# Patient Record
Sex: Female | Born: 1965 | Race: Black or African American | Hispanic: No | Marital: Single | Smoking: Former smoker
Health system: Southern US, Community
[De-identification: ages and names within clinical notes are randomized; demographics above are authoritative.]

## PROBLEM LIST (undated history)

## (undated) DIAGNOSIS — I10 Essential (primary) hypertension: Secondary | ICD-10-CM

## (undated) DIAGNOSIS — I509 Heart failure, unspecified: Secondary | ICD-10-CM

## (undated) HISTORY — DX: Essential (primary) hypertension: I10

## (undated) HISTORY — PX: NO PAST SURGERIES: SHX2092

## (undated) HISTORY — DX: Heart failure, unspecified: I50.9

---

## 2006-04-11 ENCOUNTER — Emergency Department: Payer: Self-pay | Admitting: Emergency Medicine

## 2006-04-11 ENCOUNTER — Other Ambulatory Visit: Payer: Self-pay

## 2007-03-07 ENCOUNTER — Emergency Department: Payer: Self-pay | Admitting: Emergency Medicine

## 2013-07-09 LAB — BASIC METABOLIC PANEL
Anion Gap: 9 (ref 7–16)
BUN: 24 mg/dL — ABNORMAL HIGH (ref 7–18)
CHLORIDE: 102 mmol/L (ref 98–107)
CO2: 23 mmol/L (ref 21–32)
Calcium, Total: 8.7 mg/dL (ref 8.5–10.1)
Creatinine: 1.3 mg/dL (ref 0.60–1.30)
EGFR (African American): 56 — ABNORMAL LOW
EGFR (Non-African Amer.): 48 — ABNORMAL LOW
GLUCOSE: 110 mg/dL — AB (ref 65–99)
Osmolality: 273 (ref 275–301)
Potassium: 3.5 mmol/L (ref 3.5–5.1)
Sodium: 134 mmol/L — ABNORMAL LOW (ref 136–145)

## 2013-07-09 LAB — CBC
HCT: 42.9 % (ref 35.0–47.0)
HGB: 14.1 g/dL (ref 12.0–16.0)
MCH: 33 pg (ref 26.0–34.0)
MCHC: 33 g/dL (ref 32.0–36.0)
MCV: 100 fL (ref 80–100)
Platelet: 237 10*3/uL (ref 150–440)
RBC: 4.29 10*6/uL (ref 3.80–5.20)
RDW: 13.6 % (ref 11.5–14.5)
WBC: 19.2 10*3/uL — ABNORMAL HIGH (ref 3.6–11.0)

## 2013-07-10 ENCOUNTER — Inpatient Hospital Stay: Payer: Self-pay | Admitting: Internal Medicine

## 2013-07-11 LAB — CBC WITH DIFFERENTIAL/PLATELET
Basophil #: 0 10*3/uL (ref 0.0–0.1)
Basophil %: 0.3 %
Eosinophil #: 0.1 10*3/uL (ref 0.0–0.7)
Eosinophil %: 0.4 %
HCT: 44.6 % (ref 35.0–47.0)
HGB: 15 g/dL (ref 12.0–16.0)
LYMPHS ABS: 1.1 10*3/uL (ref 1.0–3.6)
Lymphocyte %: 9.4 %
MCH: 33.3 pg (ref 26.0–34.0)
MCHC: 33.5 g/dL (ref 32.0–36.0)
MCV: 99 fL (ref 80–100)
MONO ABS: 1 x10 3/mm — AB (ref 0.2–0.9)
Monocyte %: 8.4 %
NEUTROS ABS: 9.3 10*3/uL — AB (ref 1.4–6.5)
Neutrophil %: 81.5 %
Platelet: 197 10*3/uL (ref 150–440)
RBC: 4.49 10*6/uL (ref 3.80–5.20)
RDW: 13.6 % (ref 11.5–14.5)
WBC: 11.4 10*3/uL — ABNORMAL HIGH (ref 3.6–11.0)

## 2013-07-11 LAB — LIPID PANEL
CHOLESTEROL: 157 mg/dL (ref 0–200)
HDL Cholesterol: 30 mg/dL — ABNORMAL LOW (ref 40–60)
Ldl Cholesterol, Calc: 101 mg/dL — ABNORMAL HIGH (ref 0–100)
Triglycerides: 131 mg/dL (ref 0–200)
VLDL CHOLESTEROL, CALC: 26 mg/dL (ref 5–40)

## 2013-07-11 LAB — COMPREHENSIVE METABOLIC PANEL
ALK PHOS: 89 U/L
AST: 24 U/L (ref 15–37)
Albumin: 2.9 g/dL — ABNORMAL LOW (ref 3.4–5.0)
Anion Gap: 8 (ref 7–16)
BILIRUBIN TOTAL: 0.4 mg/dL (ref 0.2–1.0)
BUN: 20 mg/dL — ABNORMAL HIGH (ref 7–18)
CALCIUM: 8.6 mg/dL (ref 8.5–10.1)
CHLORIDE: 100 mmol/L (ref 98–107)
Co2: 26 mmol/L (ref 21–32)
Creatinine: 1.31 mg/dL — ABNORMAL HIGH (ref 0.60–1.30)
GFR CALC AF AMER: 55 — AB
GFR CALC NON AF AMER: 48 — AB
Glucose: 108 mg/dL — ABNORMAL HIGH (ref 65–99)
Osmolality: 271 (ref 275–301)
Potassium: 3.3 mmol/L — ABNORMAL LOW (ref 3.5–5.1)
SGPT (ALT): 21 U/L (ref 12–78)
Sodium: 134 mmol/L — ABNORMAL LOW (ref 136–145)
Total Protein: 8.6 g/dL — ABNORMAL HIGH (ref 6.4–8.2)

## 2013-07-11 LAB — MAGNESIUM: MAGNESIUM: 1.8 mg/dL

## 2013-07-11 LAB — HEMOGLOBIN A1C: HEMOGLOBIN A1C: 6 % (ref 4.2–6.3)

## 2013-07-11 LAB — TSH: THYROID STIMULATING HORM: 1.62 u[IU]/mL

## 2013-07-12 LAB — CREATININE, SERUM
Creatinine: 1.05 mg/dL (ref 0.60–1.30)
EGFR (African American): >60
EGFR (Non-African Amer.): >60

## 2013-07-12 LAB — WBC: WBC: 10 10*3/uL (ref 3.6–11.0)

## 2013-07-12 LAB — SODIUM: Sodium: 137 mmol/L (ref 136–145)

## 2013-07-15 LAB — CULTURE, BLOOD (SINGLE)

## 2013-08-11 ENCOUNTER — Ambulatory Visit: Payer: Self-pay

## 2014-08-01 NOTE — Consult Note (Signed)
PATIENT NAME:  Sharon Delgado, Sharon Delgado MR#:  [id number] DATE OF BIRTH:  [birth year]  DATE OF CONSULTATION:  07/10/2013  CONSULTING PHYSICIAN:  Zackery Barefoot, MD  HISTORY OF PRESENT ILLNESS: The patient is a 49 year old African American female who presented to the Emergency Room last night after insect bite to the right upper ear. She developed neck pain and swelling, culminating in presentation to the Emergency Room. She was admitted for observation in the ICU because of the neck swelling and was treated with IV antibiotics. She feels significantly better today. I saw her earlier, and she in the past 5 hours has developed further improvement. She is now eating solid food and experiencing no problems with her airway. It appears that Dr. Amado Coe discussed the patient with Dr. Willeen Cass last night. A CT scan was completed, which showed no drainable abscess.   PAST MEDICAL HISTORY: Noncompliance, diabetes mellitus, hypertension, allergic rhinitis.   PAST SURGICAL HISTORY: Negative.   ALLERGIES: None.   HOME MEDICATIONS: None.  CURRENT MEDICATIONS: Include Unasyn, vancomycin, pantoprazole, p.r.n. morphine, metoprolol, labetalol, NovoLog insulin, hydralazine.   PHYSICAL EXAMINATION:  GENERAL: The patient is a pleasant, cooperative [place] American female in no acute distress.  NECK: There is visibly significant swelling in the right side of the neck. This is tender to deep palpation, measures approximately 10 x 8 cm, but there is full mobility of the neck. Trachea is in the midline.  ORAL CAVITY AND OROPHARYNX: Teeth are in poor repair with multiple caries. No obvious dental abscess. Floor of mouth is soft. Tongue is soft.  EARS: External auditory canals are clear. There is an area of excoriation in the anterosuperior helix, and the superior two thirds of the helix are significantly erythematous, but this is stable compared to 5 hours ago.  NOSE: Nares are patent. The septum is midline.   IMPRESSION AND PLAN:  Right ear cellulitis with lymphadenitis and neck cellulitis. The patient is responding very well to IV antibiotics, and her airway is stable. She is stable for transfer to the floor and will continue IV antibiotics. She can be converted to oral antibiotics after approximately 24 hours and be discharged on oral antibiotics. I will plan to see her on a p.r.n. basis.    ____________________________ Shela Commons. Gertie Baron, MD jmc:jcm D: 07/10/2013 17:22:57 ET T: 07/10/2013 18:08:48 ET JOB#: [id number]  cc: Zackery Barefoot, MD, <Dictator> [facility] - Practice Administrator Wendee Copp MD ELECTRONICALLY SIGNED 07/28/2013 21:07

## 2014-08-01 NOTE — H&P (Signed)
PATIENT NAME:  Sharon Delgado, Sharon Delgado MR#:  [id number] DATE OF BIRTH:  1966-04-07  DATE OF ADMISSION:  07/10/2013  PRIMARY CARE PHYSICIAN:  None.  REFERRING PHYSICIAN:  Dr. Merlinda Frederick.  CHIEF COMPLAINT:  Right-sided neck pain and swelling.   HISTORY OF PRESENT ILLNESS:  The patient is a 49 year old African American female with a past medical history of diabetes mellitus, not seen by any physician for the past several years, is presenting to the ER with a chief complaint of right-sided neck pain and swelling.  The patient is reporting that approximately seven days ago she noticed a small pimple-like swelling on the right earlobe.  The patient manipulated the pimple with her nails and subsequently it got ruptured.  Following that, she has noticed right-sided neck swelling for the past two days, which has been worse with severe swelling and pain.  The patient denies any fevers.  No similar complaints in the past.  She was nauseated and vomited x 2 last night.  Denies any nausea or vomiting today.  Denies any difficulty in swallowing today.  Denies any shortness of breath or chest pain.  Denies any history of MRSA.  She is diabetic and not on any medications.  Not seen by any doctors for the past several years.  Her mom is also diabetic and checks the patient's blood sugar once in a while.  She states that her blood sugar usually runs okay and she could not recall any numbers.  CAT scan of the neck with contrast was done which has revealed extensive soft tissue edema along the right side of the neck extending into the prevertebral space and also compressing right internal jugular vein due to surrounding edema.  The ER physician has discussed this with on-call ENT doctor Dr. Willeen Cass who has recommended IV antibiotics at this point.  The patient was given IV vancomycin and Unasyn and hospitalist team is called for admission.  During my examination the patient is still in pain, but she was able to move her neck from side  to side.  Denies any shortness of breath, difficulty in swallowing.  She does not feel like her throat is closing up.  Her vital signs are stable except the blood pressure which is extremely high.  The patient was given IV Dilaudid for pain control.  The patient's sister is at bedside during my examination.   PAST MEDICAL HISTORY:  Noncompliance, diabetes mellitus, hypertension, allergic rhinitis.   PAST SURGICAL HISTORY:  None.   ALLERGIES:  No known drug allergies.   PSYCHOSOCIAL HISTORY:  Lives at home with her children, smokes cigars.  Denies alcohol.  She used to smoke marijuana, but not anymore.   FAMILY HISTORY:  Mother has diabetes mellitus.   HOME MEDICATIONS:  None.  REVIEW OF SYSTEMS:  CONSTITUTIONAL:  Denies any fever, fatigue or weakness.  EYES:  Denies blurry vision, double vision.  EARS, NOSE, THROAT:  Denies tinnitus.  Complaining of a bump-like swelling on her earlobe.  Denies any hearing loss, snoring, postnasal drip.  She has chronic allergic rhinitis.  Right lateral aspect of the neck has massive edema and tender, warm to touch.  Positive cervical lymphadenopathy.  RESPIRATORY:  Denies cough, shortness of breath, painful respiration or COPD.  CARDIOVASCULAR:  No chest pain, palpitations, syncope.  GASTROINTESTINAL:  She had nausea and two episodes of vomiting yesterday, but denies any today.  Denies abdominal pain, hematemesis, melena.  GENITOURINARY:  No dysuria, hematuria.  GYNECOLOGIC AND BREAST:  Denies breast mass or vaginal  discharge.  ENDOCRINE:  Denies polyuria, nocturia, thyroid problems.  Has chronic history of diabetes mellitus, not on any medications.  HEMATOLOGIC AND LYMPHATIC:  No anemia, easy bruising, bleeding.  INTEGUMENTARY:  No acne, rash.  She has a lesion on the right earlobe.  She has right-sided cervical lymphadenopathy.  NEUROLOGIC:  Denies any numbness, weakness, dysarthria.  Denies ataxia, dementia.  PSYCHIATRIC:  No ADD, OCD.  No anxiety.   MUSCULOSKELETAL:  Complaining of neck pain, but denies any back pain.  Denies any shoulder pain.  Denies gout.   PHYSICAL EXAMINATION: VITAL SIGNS:  Temperature 99.8, pulse 85, respirations 15 to 18, blood pressure 183/98, pulse ox 98%.  GENERAL APPEARANCE:  Not under acute distress.  Moderately built and obese.  HEENT:  Normocephalic, atraumatic.  Pupils are equally reactive to light and accommodation.  No scleral icterus.  No conjunctival injection.  Nares are patent.  Examination of the oral cavity, moist mucous membranes.  Right molar tooth seemed to be infected.  Uvula is midline.  Right lateral aspect of the neck with massive edema, tenderness, warm to touch, positive right cervical lymphadenopathy.  The right earlobe 1 x 1 cm small pustule is noticed with no discharge.  It is tender to touch.  LUNGS:  Clear to auscultation bilaterally.  No axillary muscle usage.  Minimal end expiratory wheezing is noted.  CARDIAC:  S1, S2 normal.  Regular rate and rhythm.  No murmur.  GASTROINTESTINAL:  Soft, obese.  Bowel sounds are positive in all four quadrants.  Nontender, nondistended.  No hepatosplenomegaly.  No masses felt.  NEUROLOGIC:  Awake, alert, oriented x 3.  Upper motor and sensory are grossly intact in both upper and lower extremities.  Cranial nerves II through XII are intact.  Reflexes are 2+.  EXTREMITIES:  No edema.  No cyanosis.  No clubbing.  SKIN:  Warm to touch.  Normal turgor.  No rashes.  No lesions, except those mentioned under ENT.  PSYCHIATRIC:  Normal mood and affect.   LABORATORY AND IMAGING STUDIES:   1.  CAT scan of the neck with contrast has revealed no drainable fluid collection is identified, extensive soft tissue edema along the right side of the neck with diffuse soft tissue density expanding the right parapharyngeal fat planes and extending into the prevertebral space and left prominent soft tissue edema extending to the left pharyngeal fat plane associated with wall  thickening along the right common and internal carotid artery, likely reflects the active changes.  Compression of the right internal jugular vein due to surrounding edema.   2.  Enlargement somewhat edematous right cervical nodes measuring up to 1.3 cm in the short access.   3.  A 2.8 x 1.9 cm  lesion at the right side of the anterior hard palate, could reflect a very large periapical abscess given the extent of underlying dental disease.  Minimum partial opacification of the right mastoid air cells and partial opacification of the right side of the sphenoid sinus.    Accu-Chek 173.  Glucose 110, BUN 24, creatinine 1.30.  Serum sodium 134, potassium 3.5, chloride 102, CO2 23, anion gap 9, GFR 48 and serum osmolality 270.  Calcium 8.7.  CBC, white count is elevated at 19.2.  The rest of the labs are normal.    ASSESSMENT AND PLAN:   1.  A 49 year old [place] American female who was not seen by any doctor in the past several years comes with right neck pain and swelling with soft tissue density  extending into the prevertebral space and compression of the right internal jugular vein due to surrounding edema.  We will admit the patient to Critical Care Unit stepdown.  We will provide her intravenous antibiotics, clindamycin and vancomycin.  ENT consult is placed.  We will provide her oxygen via nasal cannula as needed.  The patient was explained the possibility of airway compromise and she agrees with the plan of intubation if needed.  The CAT scan results were discussed and the complications were explained to the patient.  She is aware.  Follow up with a blood cultures and check a.m. labs.  We will provide her pain management with intravenous Toradol and morphine as needed basis.  2.  Malignant hypertension.  The patient is not on any blood pressure medications.  The patient is started on metoprolol and amlodipine.  We will provide her intravenous beta blockers as needed basis for uncontrolled blood  pressure.  3.  History of diabetes mellitus.  We will check hemoglobin A1c.  The patient will be on sliding scale insulin.  Diabetic diet education was provided.   4.  Noncompliance.  I have reinforced the importance of following up with the primary care physician as she has diabetes and hypertension.  5.  We will provide gastrointestinal and deep vein thrombosis prophylaxis.   Plan of care discussed in detail with the patient and her sister at bedside.  They both verbalized understanding of the plan.   CODE STATUS:  SHE IS FULL CODE.  Sister is the medical power of attorney.   Total time spent on the admission is 50 minutes.    ____________________________ Ramonita Lab, MD ag:ea D: 07/10/2013 01:03:00 ET T: 07/10/2013 02:23:12 ET JOB#: [id number]  cc: Ramonita Lab, MD, <Dictator> Ramonita Lab MD ELECTRONICALLY SIGNED 07/17/2013 4:43

## 2014-08-01 NOTE — Discharge Summary (Signed)
PATIENT NAME:  Sharon Delgado, Sharon Delgado MR#:  [id number] DATE OF BIRTH:  07/16/1965  DATE OF ADMISSION:  07/10/2013 DATE OF DISCHARGE:  07/12/2013  ADMISSION DIAGNOSIS: Ear and neck cellulitis.   DISCHARGE DIAGNOSES: 1.  Ear and neck cellulitis. 2.  Accelerated hypertension.  3.  History of diabetes.   CONSULTATIONS: ENT.  LABORATORIES AT DISCHARGE: A1c is 6.0. Sodium is 137, creatinine 1.05.   HOSPITAL COURSE: A 49 year old female who presented to the ER after a probable spider bite with a mass on her neck. She had a CT scan which showed evidence of cellulitis. For further details, please refer to the H and P.  1.  Ear and neck cellulitis.  ENT was consulted. No abscess was noted on the CT scan. Her swelling has actually improved with IV antibiotics. She will be changed to clindamycin and Augmentin at discharge. She also has really poor dental hygiene which I spoke to her about which is in need attention.  2.  Sepsis. Her neck cellulitis and lymphadenitis improved with IV antibiotics. Blood cultures negative to date.  3.  Malignant hypertension: The patient's blood pressure is not very well controlled. I put her on some medications but she will need close follow-up for her blood pressure.  4.  Tobacco abuse. The patient was encouraged to stop smoking.  5.  History of diabetes. Her A1c 6.0. She will need follow-up with her PCP.  6.  Hyperlipidemia. LDL was 101, with a low HDL. Recommend to quit smoking and exercise to increase HDL.  7.  She has been reinforced the importance of following up with her primary care physician, as she has diabetes and hypertension.   DISCHARGE MEDICATIONS: 1.  Clindamycin 300 mg q. 8 hours x 11 days.  2.  Augmentin 875/125 q. 12 hours x 11 days.  3.  Accupril 40 mg daily.  4.  HCTZ 50 mg daily.  5.  Aspirin 81 mg daily.   DISCHARGE DIET: Low sodium, ADA diet.   DISCHARGE ACTIVITY: As tolerated.   DISCHARGE FOLLOWUP: The patient was given instructions on how to  find a primary care physician in this area and will need follow-up in one week .  She is on HCTZ and lisinopril. This was discussed in detail with the patient.  The patient is medically stable for discharge.    TIME SPENT:  35 minutes.   ____________________________ Janyth Contes. Juliene Pina, MD spm:dp D: 07/12/2013 13:50:08 ET T: 07/12/2013 16:30:36 ET JOB#: [id number]  cc: Veyda Kaufman P. Juliene Pina, MD, <Dictator> Janyth Contes Jajuan Skoog MD ELECTRONICALLY SIGNED 07/12/2013 21:35

## 2014-08-17 IMAGING — CT CT NECK WITH CONTRAST
4 of 5 series · 16 of 33 positions shown, 18 images · IV contrast (isovue)
Comparison: None.

CLINICAL DATA: Bit by a spider; diffuse right-sided neck and facial
swelling, with associated cellulitis.

EXAM:
CT NECK WITH CONTRAST
TECHNIQUE: Multidetector CT imaging of the neck was performed using the
standard protocol following the bolus administration of intravenous
contrast.
CONTRAST:  75 mL of Isovue 370 IV contrast

[Series 3: axial neck · axial · 0.49mm/px · z∈[-278,-140]mm · 4 of 116 slices shown, 5 images]
[im 24/116  soft-tissue]
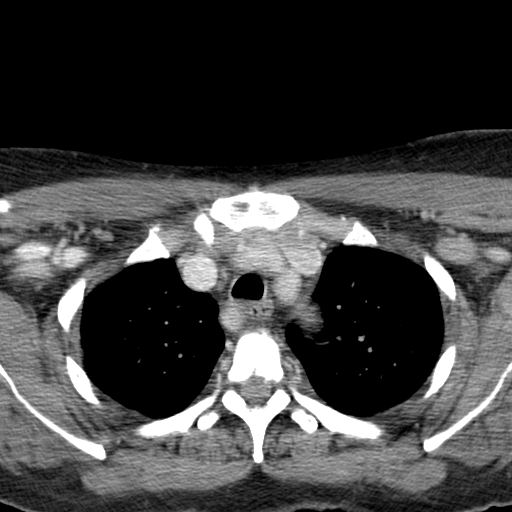
[im 24/116  bone]
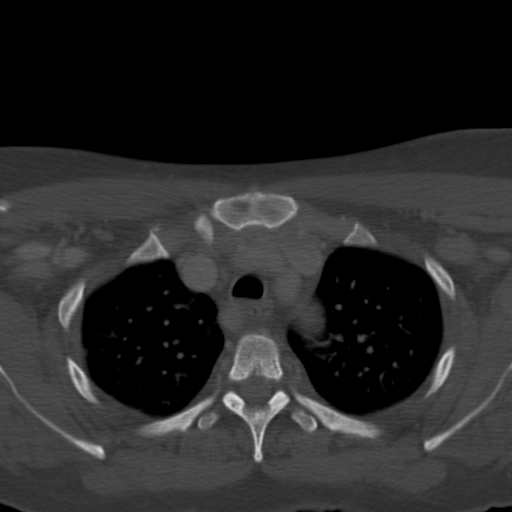
[im 47/116  bone]
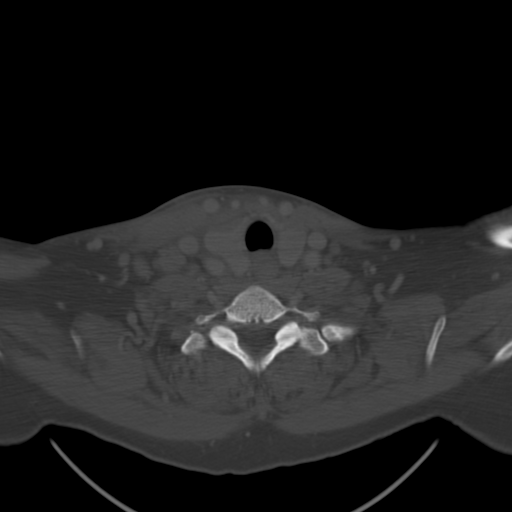
[im 70/116  bone]
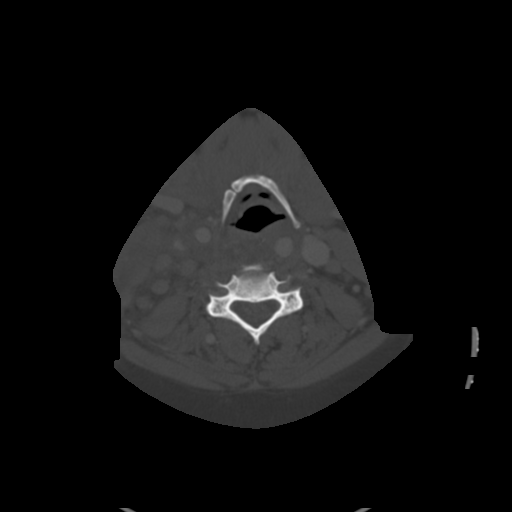
[im 93/116  bone]
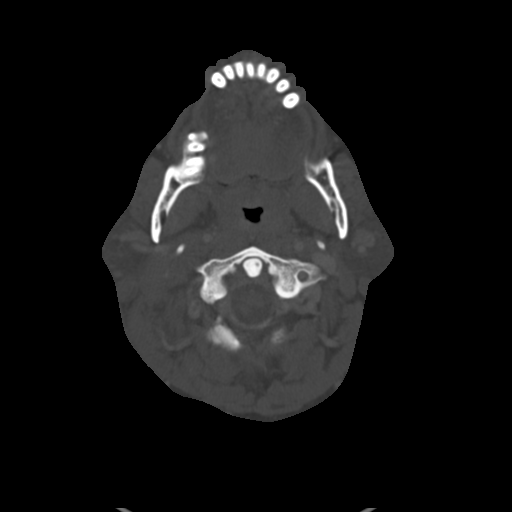

[Series 6: sag neck · sagittal · 0.56mm/px · 5 of 125 slices shown, 6 images]
[im 42/125  bone]
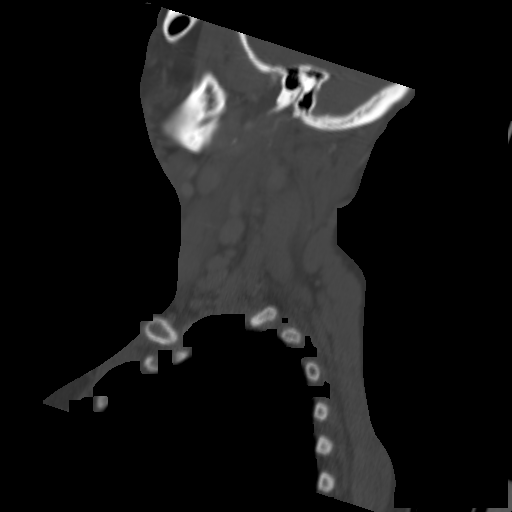
[im 52/125  bone]
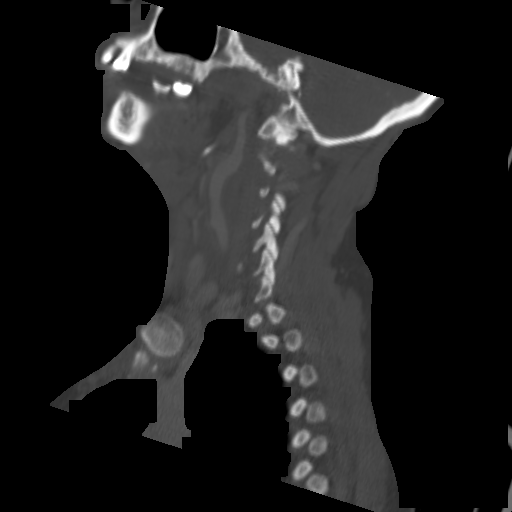
[im 63/125  soft-tissue]
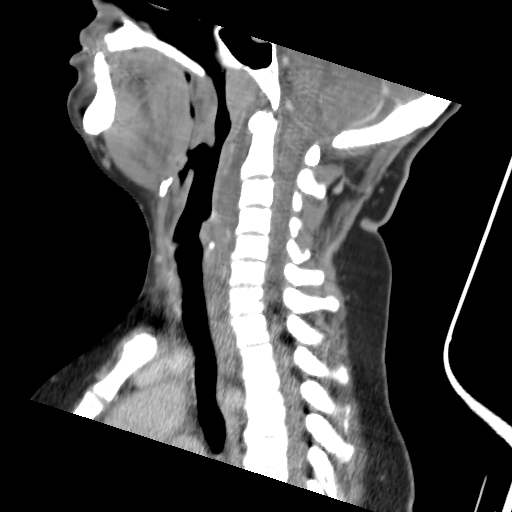
[im 63/125  bone]
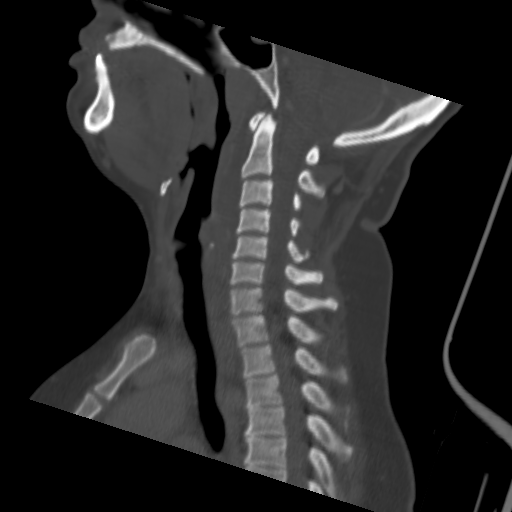
[im 73/125  bone]
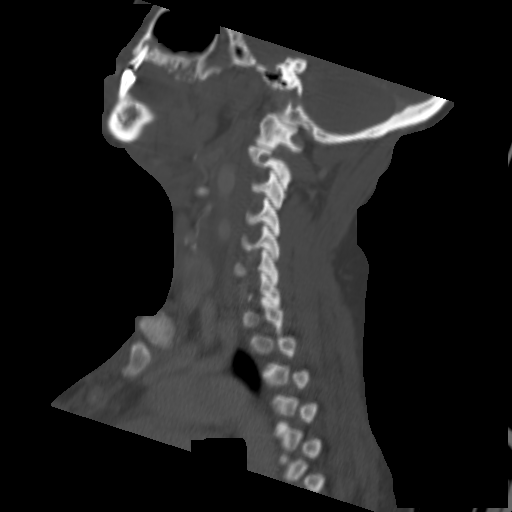
[im 83/125  bone]
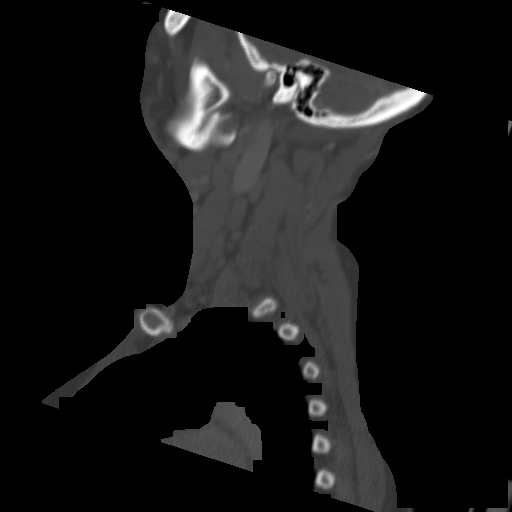

[Series 7: cor neck · coronal · 0.68mm/px · 3 of 99 slices shown]
[im 29/99  bone]
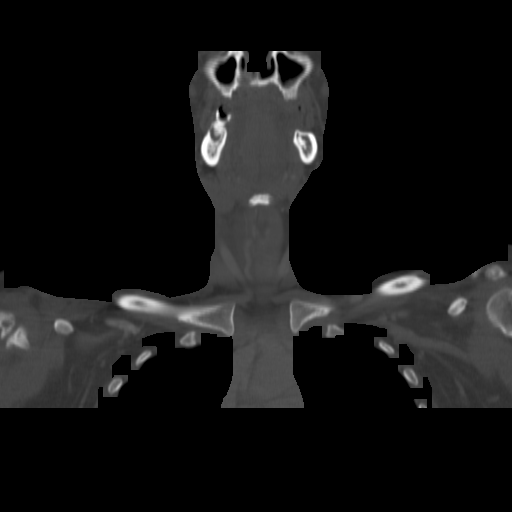
[im 43/99  bone]
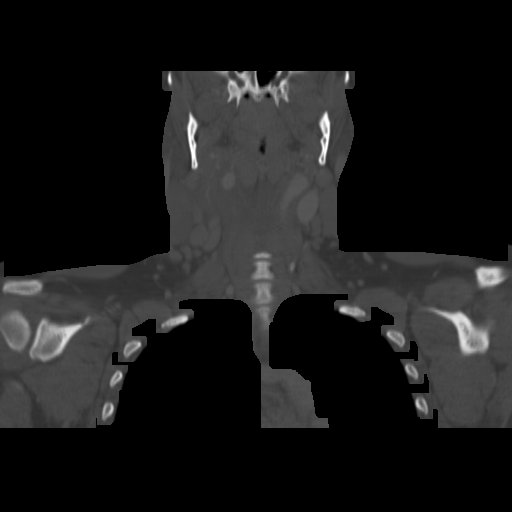
[im 56/99  bone]
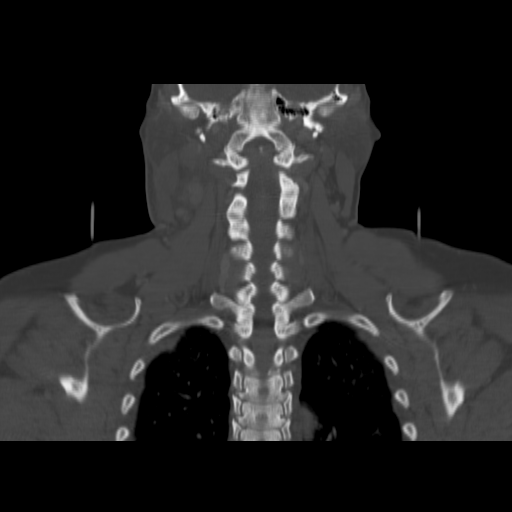

[Series 8: ax oropharynx · axial · 0.68mm/px · z∈[-356,-225]mm · 4 of 127 slices shown]
[im 26/127  bone]
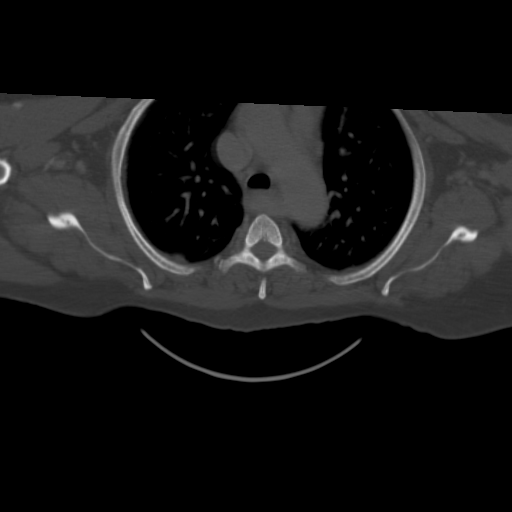
[im 51/127  bone]
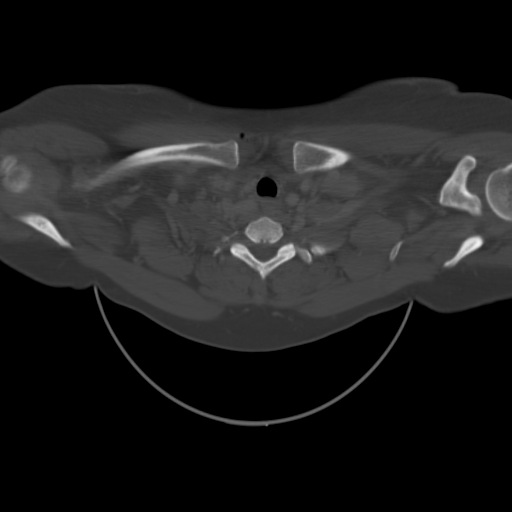
[im 76/127  bone]
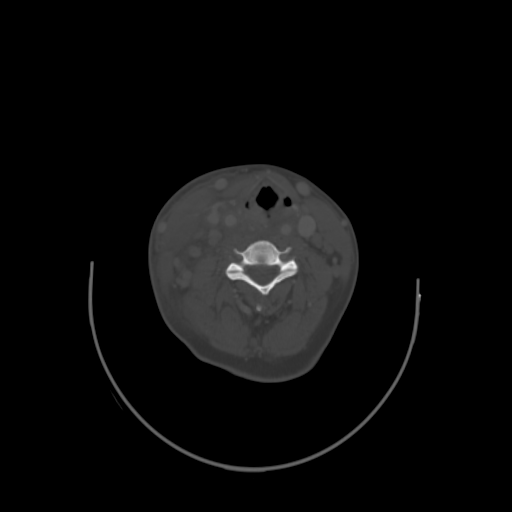
[im 101/127  bone]
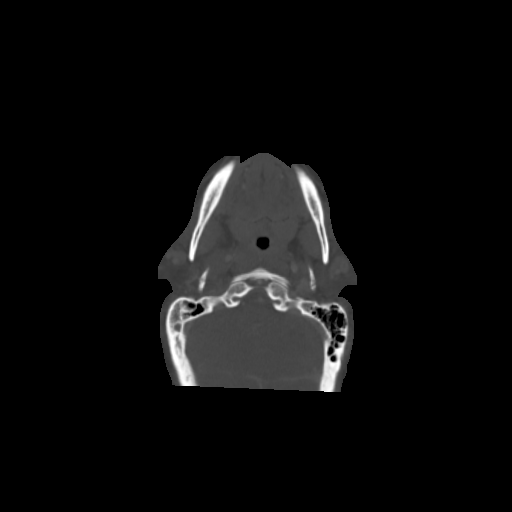

[16 of 33 positions shown; findings below may reference images not displayed]

FINDINGS: There is extensive soft tissue edema extending along the right side
of the neck, with diffuse soft tissue density filling the right
parapharyngeal fat planes, extending into the prevertebral space,
with less prominent soft tissue edema along the left parapharyngeal
fat planes. Associated wall thickening is noted along the course of
the right common and internal carotid arteries, likely reflecting
reactive change. There is compression of the right internal jugular
vein due to surrounding edema. Surrounding enlarged and somewhat
edematous right cervical nodes are seen, measuring up to 1.3 cm in
short axis.

No definite drainable fluid collection is identified, though the
prevertebral involvement is somewhat concerning. There is no
definite evidence of involvement of the floor of the mouth at this
time.

The parotid and submandibular glands remain relatively symmetric and
grossly unremarkable. The remainder of the visualized vasculature is
grossly symmetric. Note is incidentally made of a retroesophageal
right subclavian artery. The visualized portions of the lung apices
are grossly clear, aside from a bleb at the right lung apex.

A large 2.8 x 1.9 cm erosive lesion is noted at the right side of
the anterior hard palate. This could reflect a very large periapical
abscess, given the extent of underlying dental disease. There is
absence of much of the maxillary dentition, with multiple dental
caries seen.

There is minimal partial opacification of the right mastoid air
cells, and partial opacification of the right side of the sphenoid
sinus. The remaining visualized paranasal sinuses and left mastoid
air cells are well aerated. The visualized portions of the brain are
grossly unremarkable in appearance.
IMPRESSION: 1. No definite drainable fluid collection identified.
2. Extensive soft tissue edema along the right side of the neck,
with diffuse soft tissue density expanding the right parapharyngeal
fat planes and extending into the prevertebral space, and less
prominent soft tissue edema extending to the left parapharyngeal fat
planes. Associated wall thickening along the right common and
internal carotid arteries likely reflects reactive change.
Compression of the right internal jugular vein due to surrounding
edema.
3. Enlargement somewhat edematous right cervical nodes seen,
measuring up to 1.3 cm in short axis.
4. 2.8 x 1.9 cm erosive lesion at the right side of the anterior
hard palate could reflect a very large periapical abscess, given the
extent of underlying dental disease.
5. Minimal partial opacification of the right mastoid air cells, and
partial opacification of the right side of the sphenoid sinus.

## 2016-07-22 ENCOUNTER — Ambulatory Visit
Admission: EM | Admit: 2016-07-22 | Discharge: 2016-07-22 | Disposition: A | Discharge status: Home / Self Care | Payer: Self-pay | Attending: Family Medicine | Admitting: Family Medicine

## 2016-07-22 DIAGNOSIS — K122 Cellulitis and abscess of mouth: Secondary | ICD-10-CM

## 2016-07-22 DIAGNOSIS — K0510 Chronic gingivitis, plaque induced: Secondary | ICD-10-CM

## 2016-07-22 DIAGNOSIS — K047 Periapical abscess without sinus: Secondary | ICD-10-CM

## 2016-07-22 MED ORDER — HYDROCODONE-ACETAMINOPHEN 5-325 MG PO TABS: 1.0000 | ORAL_TABLET | ORAL | 0 refills | Status: DC | PRN

## 2016-07-22 MED ORDER — CEFTRIAXONE SODIUM 1 G IJ SOLR
1.0000 g | Freq: Once | INTRAMUSCULAR | Status: AC
  Administered 2016-07-22: 1 g via INTRAMUSCULAR

## 2016-07-22 MED ORDER — AMOXICILLIN-POT CLAVULANATE 875-125 MG PO TABS: 1.0000 | ORAL_TABLET | Freq: Two times a day (BID) | ORAL | 0 refills | Status: DC

## 2016-07-22 NOTE — ED Provider Notes (Signed)
CSN: [id number]     Arrival date & time 07/22/16  1542 History   First MD Initiated Contact with Patient 07/22/16 1612     Chief Complaint  Patient presents with  . Facial Swelling   (Consider location/radiation/quality/duration/timing/severity/associated sxs/prior Treatment) 51 year old female with past medical history as below who presents with right facial swelling and pain that began yesterday and is worsening. Patient also reports ongoing seasonal allergy symptoms that have included nasal and sinus congestion. Patient denies any fevers but did report some chills this past Wednesday. Patient denies any pain with biting and denies any recent that the work. She does report that she does have some teeth that have been giving her problems and probably need to be removed. Patient denies headache but does report that her nasal passages have been swollen and states that the right side of her face is tender to touch.  Patient reports history of smoking but stopped over one year ago      History reviewed. No pertinent past medical history. Past Surgical History:  Procedure Laterality Date  . NO PAST SURGERIES     History reviewed. No pertinent family history. Social History  Substance Use Topics  . Smoking status: Former Games developer  . Smokeless tobacco: Never Used  . Alcohol use Yes     Comment: beer   OB History    No data available     Review of Systems  HENT: Positive for congestion, dental problem, facial swelling, sinus pain and sinus pressure.        Seasonal allergies  All other systems reviewed and are negative.   Allergies  Patient has no known allergies.  Home Medications   Prior to Admission medications   Medication Sig Start Date End Date Taking? Authorizing Provider  amoxicillin-clavulanate (AUGMENTIN) 875-125 MG tablet Take 1 tablet by mouth every 12 (twelve) hours. First dose tonight 07/22/16   Candis Schatz, PA-C  HYDROcodone-acetaminophen (NORCO/VICODIN) 5-325  MG tablet Take 1 tablet by mouth every 4 (four) hours as needed. 07/22/16   Candis Schatz, PA-C   Meds Ordered and Administered this Visit   Medications  cefTRIAXone (ROCEPHIN) injection 1 g (1 g Intramuscular Given 07/22/16 1636)    BP (!) 198/93 (BP Location: Left Arm)   Pulse 78   Temp 98.7 F (37.1 C) (Oral)   Resp 16   Ht 5\' 1"  (1.549 m)   Wt 160 lb (72.6 kg)   SpO2 100%   BMI 30.23 kg/m  No data found.   Physical Exam  Constitutional: She is oriented to person, place, and time. She appears well-developed and well-nourished.  HENT:  Head: Normocephalic and atraumatic.  Mouth/Throat: Uvula is midline. Abnormal dentition. Dental caries present.  Poor dentition with multiple teeth that need to be removed due to gum disease. Abscess palpable to cheek alongside the back of the upper gums on the right. Area tender and warm to the touch.  Eyes: EOM are normal. Pupils are equal, round, and reactive to light.  Neck: Normal range of motion.  Cardiovascular: Normal rate and regular rhythm.   Pulmonary/Chest: Effort normal.  Abdominal: Soft. Bowel sounds are normal.  Musculoskeletal: Normal range of motion.  Neurological: She is alert and oriented to person, place, and time.  Skin: Skin is warm and dry.    Urgent Care Course     Procedures none  Labs Review Labs Reviewed - No data to display  Imaging Review No results found.    MDM   1.  Dental abscess   2. Gingivitis   3. Oral abscess    Patient presents with oral abscess with pain and swelling that began yesterday. Symptoms likely masked by her ongoing seasonal allergy issues. Patient with multiple areas of poor dentition. Patient given injection of 1 g of Rocephin and a course of antibiotics with Augmentin every 12 hours with instruction to take her first dose this evening. Patient advised that if her pain is worse at all tomorrow or if not better by the end of tomorrow afternoon to present to ER. Recommended  either [facility] or [facility] as they would have ENT specialist available to see her at that point. Patient advised that she not have any issues until Monday, she can follow-up with her primary care provider. Patient verbalized understanding is in agreement with plan. Patient was seen in tandem with Dr. Thurmond Butts.  Candis Schatz, PA-C     Candis Schatz, PA-C 07/22/16 [phone]

## 2016-07-22 NOTE — Discharge Instructions (Signed)
-  you received an injection of 1 gram of Rocephin in the clinic -Augmentin, one tablet every 12 hours with the first dose tonight -you may take OTC Tylenol and ibuprofen as needed for pain -Norco 1 tablet every 4 hours as needed for pain that does not improve with OTC medications -If symptoms are worse tomorrow, please go to the emergency for further treatment. Recommend [facility] or [facility] a they will have Ear/Nose/Throat specialists available -If symptoms are not better by the end of day tomorrow, recommend going into the ER at that time as well.

## 2016-07-22 NOTE — ED Triage Notes (Signed)
Patient complains of facial swelling that started yesterday on her right side. Patient states that she was having teeth pain prior to swelling, pain has since went away.

## 2018-07-15 ENCOUNTER — Encounter: Payer: Self-pay | Admitting: Family Medicine

## 2018-07-15 ENCOUNTER — Ambulatory Visit: Payer: Medicaid Other | Admitting: Family Medicine

## 2018-07-15 NOTE — Progress Notes (Signed)
Reached out to Sharon Delgado - was able to make contact via telephone, then call dropped.  Attempted to call back several times and was unable to do so.  She is welcome to reschedule at any time.

## 2018-10-30 ENCOUNTER — Other Ambulatory Visit: Payer: Self-pay

## 2018-10-30 DIAGNOSIS — Z20822 Contact with and (suspected) exposure to covid-19: Secondary | ICD-10-CM

## 2018-11-02 LAB — NOVEL CORONAVIRUS, NAA: SARS-CoV-2, NAA: NOT DETECTED

## 2019-07-04 ENCOUNTER — Ambulatory Visit: Payer: Medicaid Other | Attending: Internal Medicine

## 2019-07-04 DIAGNOSIS — Z23 Encounter for immunization: Secondary | ICD-10-CM

## 2019-07-04 NOTE — Progress Notes (Signed)
   Covid-19 Vaccination Clinic  Name:  Sharon Delgado    MRN: [id number] DOB: December 02, 1965  07/04/2019  [name] was observed post Covid-19 immunization for 15 minutes without incident. She was provided with Vaccine Information Sheet and instruction to access the V-Safe system.   [name] was instructed to call 911 with any severe reactions post vaccine: Marland Kitchen Difficulty breathing  . Swelling of face and throat  . A fast heartbeat  . A bad rash all over body  . Dizziness and weakness   Immunizations Administered    Name Date Dose VIS Date Route   Pfizer COVID-19 Vaccine 07/04/2019  4:54 PM 0.3 mL 03/21/2019 Intramuscular   Manufacturer: [employer], [employer]   Lot: [id number]   NDC: [address]-1

## 2019-07-25 ENCOUNTER — Ambulatory Visit: Payer: Medicaid Other | Attending: Internal Medicine

## 2019-07-25 DIAGNOSIS — Z23 Encounter for immunization: Secondary | ICD-10-CM

## 2019-07-25 NOTE — Progress Notes (Signed)
   Covid-19 Vaccination Clinic  Name:  [name]    MRN: [id number] DOB: Aug 27, 1965  07/25/2019  [name] was observed post Covid-19 immunization for 15 minutes without incident. She was provided with Vaccine Information Sheet and instruction to access the V-Safe system.   [name] was instructed to call 911 with any severe reactions post vaccine: Marland Kitchen Difficulty breathing  . Swelling of face and throat  . A fast heartbeat  . A bad rash all over body  . Dizziness and weakness   Immunizations Administered    Name Date Dose VIS Date Route   Pfizer COVID-19 Vaccine 07/25/2019  4:34 PM 0.3 mL 03/21/2019 Intramuscular   Manufacturer: [employer], [employer]   Lot: [id number]   NDC: [address]-6

## 2020-12-07 ENCOUNTER — Inpatient Hospital Stay (HOSPITAL_COMMUNITY)
Admit: 2020-12-07 | Discharge: 2020-12-07 | Disposition: A | Discharge status: Home / Self Care | Payer: Self-pay | Attending: Internal Medicine | Admitting: Internal Medicine

## 2020-12-07 ENCOUNTER — Inpatient Hospital Stay
Admission: EM | Admit: 2020-12-07 | Discharge: 2020-12-12 | DRG: 286 | Disposition: A | Discharge status: Home / Self Care | Payer: Self-pay | Attending: Internal Medicine | Admitting: Internal Medicine

## 2020-12-07 ENCOUNTER — Emergency Department: Payer: Self-pay

## 2020-12-07 DIAGNOSIS — I5023 Acute on chronic systolic (congestive) heart failure: Secondary | ICD-10-CM | POA: Diagnosis present

## 2020-12-07 DIAGNOSIS — Z7982 Long term (current) use of aspirin: Secondary | ICD-10-CM

## 2020-12-07 DIAGNOSIS — I509 Heart failure, unspecified: Secondary | ICD-10-CM

## 2020-12-07 DIAGNOSIS — I161 Hypertensive emergency: Secondary | ICD-10-CM | POA: Diagnosis present

## 2020-12-07 DIAGNOSIS — Z8249 Family history of ischemic heart disease and other diseases of the circulatory system: Secondary | ICD-10-CM

## 2020-12-07 DIAGNOSIS — I2510 Atherosclerotic heart disease of native coronary artery without angina pectoris: Secondary | ICD-10-CM | POA: Diagnosis present

## 2020-12-07 DIAGNOSIS — J189 Pneumonia, unspecified organism: Secondary | ICD-10-CM

## 2020-12-07 DIAGNOSIS — Z720 Tobacco use: Secondary | ICD-10-CM

## 2020-12-07 DIAGNOSIS — R0602 Shortness of breath: Secondary | ICD-10-CM | POA: Diagnosis present

## 2020-12-07 DIAGNOSIS — R0902 Hypoxemia: Secondary | ICD-10-CM | POA: Diagnosis present

## 2020-12-07 DIAGNOSIS — F17290 Nicotine dependence, other tobacco product, uncomplicated: Secondary | ICD-10-CM | POA: Diagnosis present

## 2020-12-07 DIAGNOSIS — I248 Other forms of acute ischemic heart disease: Secondary | ICD-10-CM | POA: Diagnosis present

## 2020-12-07 DIAGNOSIS — I160 Hypertensive urgency: Secondary | ICD-10-CM

## 2020-12-07 DIAGNOSIS — I428 Other cardiomyopathies: Secondary | ICD-10-CM | POA: Diagnosis present

## 2020-12-07 DIAGNOSIS — Z9114 Patient's other noncompliance with medication regimen: Secondary | ICD-10-CM

## 2020-12-07 DIAGNOSIS — R0609 Other forms of dyspnea: Secondary | ICD-10-CM

## 2020-12-07 DIAGNOSIS — Z825 Family history of asthma and other chronic lower respiratory diseases: Secondary | ICD-10-CM

## 2020-12-07 DIAGNOSIS — Z575 Occupational exposure to toxic agents in other industries: Secondary | ICD-10-CM

## 2020-12-07 DIAGNOSIS — N179 Acute kidney failure, unspecified: Secondary | ICD-10-CM | POA: Diagnosis present

## 2020-12-07 DIAGNOSIS — J810 Acute pulmonary edema: Secondary | ICD-10-CM

## 2020-12-07 DIAGNOSIS — Z20822 Contact with and (suspected) exposure to covid-19: Secondary | ICD-10-CM | POA: Diagnosis present

## 2020-12-07 DIAGNOSIS — R7989 Other specified abnormal findings of blood chemistry: Secondary | ICD-10-CM | POA: Diagnosis present

## 2020-12-07 DIAGNOSIS — I110 Hypertensive heart disease with heart failure: Principal | ICD-10-CM | POA: Diagnosis present

## 2020-12-07 DIAGNOSIS — R778 Other specified abnormalities of plasma proteins: Secondary | ICD-10-CM | POA: Diagnosis present

## 2020-12-07 LAB — BRAIN NATRIURETIC PEPTIDE: B Natriuretic Peptide: 952.8 pg/mL — ABNORMAL HIGH (ref 0.0–100.0)

## 2020-12-07 LAB — COMPREHENSIVE METABOLIC PANEL
ALT: 59 U/L — ABNORMAL HIGH (ref 0–44)
AST: 76 U/L — ABNORMAL HIGH (ref 15–41)
Albumin: 4 g/dL (ref 3.5–5.0)
Alkaline Phosphatase: 99 U/L (ref 38–126)
Anion gap: 9 (ref 5–15)
BUN: 19 mg/dL (ref 6–20)
CO2: 26 mmol/L (ref 22–32)
Calcium: 9 mg/dL (ref 8.9–10.3)
Chloride: 106 mmol/L (ref 98–111)
Creatinine, Ser: 1.27 mg/dL — ABNORMAL HIGH (ref 0.44–1.00)
GFR, Estimated: 50 mL/min — ABNORMAL LOW (ref >60)
Glucose, Bld: 180 mg/dL — ABNORMAL HIGH (ref 70–99)
Potassium: 4.1 mmol/L (ref 3.5–5.1)
Sodium: 141 mmol/L (ref 135–145)
Total Bilirubin: 1 mg/dL (ref 0.3–1.2)
Total Protein: 8.7 g/dL — ABNORMAL HIGH (ref 6.5–8.1)

## 2020-12-07 LAB — ECHOCARDIOGRAM COMPLETE
AR max vel: 1.42 cm2
AV Area VTI: 1.65 cm2
AV Area mean vel: 1.45 cm2
AV Mean grad: 7.3 mmHg
AV Peak grad: 12.2 mmHg
Ao pk vel: 1.74 m/s
Area-P 1/2: 5.02 cm2
Calc EF: 41.2 %
Height: 61 in
S' Lateral: 4.28 cm
Single Plane A2C EF: 39.3 %
Single Plane A4C EF: 35 %
Weight: 2560 oz

## 2020-12-07 LAB — LACTIC ACID, PLASMA
Lactic Acid, Venous: 1.7 mmol/L (ref 0.5–1.9)
Lactic Acid, Venous: 1.7 mmol/L (ref 0.5–1.9)

## 2020-12-07 LAB — CBC WITH DIFFERENTIAL/PLATELET
Abs Immature Granulocytes: 0.03 10*3/uL (ref 0.00–0.07)
Basophils Absolute: 0.1 10*3/uL (ref 0.0–0.1)
Basophils Relative: 0 %
Eosinophils Absolute: 0.1 10*3/uL (ref 0.0–0.5)
Eosinophils Relative: 1 %
HCT: 51.4 % — ABNORMAL HIGH (ref 36.0–46.0)
Hemoglobin: 17.3 g/dL — ABNORMAL HIGH (ref 12.0–15.0)
Immature Granulocytes: 0 %
Lymphocytes Relative: 25 %
Lymphs Abs: 3.1 10*3/uL (ref 0.7–4.0)
MCH: 33.1 pg (ref 26.0–34.0)
MCHC: 33.7 g/dL (ref 30.0–36.0)
MCV: 98.5 fL (ref 80.0–100.0)
Monocytes Absolute: 0.7 10*3/uL (ref 0.1–1.0)
Monocytes Relative: 5 %
Neutro Abs: 8.4 10*3/uL — ABNORMAL HIGH (ref 1.7–7.7)
Neutrophils Relative %: 69 %
Platelets: 232 10*3/uL (ref 150–400)
RBC: 5.22 MIL/uL — ABNORMAL HIGH (ref 3.87–5.11)
RDW: 13.3 % (ref 11.5–15.5)
WBC: 12.4 10*3/uL — ABNORMAL HIGH (ref 4.0–10.5)
nRBC: 0 % (ref 0.0–0.2)

## 2020-12-07 LAB — HIV ANTIBODY (ROUTINE TESTING W REFLEX): HIV Screen 4th Generation wRfx: NONREACTIVE

## 2020-12-07 LAB — RESP PANEL BY RT-PCR (FLU A&B, COVID) ARPGX2
Influenza A by PCR: NEGATIVE
Influenza B by PCR: NEGATIVE
SARS Coronavirus 2 by RT PCR: NEGATIVE

## 2020-12-07 LAB — GLUCOSE, CAPILLARY: Glucose-Capillary: 245 mg/dL — ABNORMAL HIGH (ref 70–99)

## 2020-12-07 LAB — MRSA NEXT GEN BY PCR, NASAL: MRSA by PCR Next Gen: NOT DETECTED

## 2020-12-07 LAB — TROPONIN I (HIGH SENSITIVITY)
Troponin I (High Sensitivity): 60 ng/L — ABNORMAL HIGH (ref <18)
Troponin I (High Sensitivity): 105 ng/L (ref <18)
Troponin I (High Sensitivity): 101 ng/L (ref <18)
Troponin I (High Sensitivity): 96 ng/L — ABNORMAL HIGH (ref <18)

## 2020-12-07 LAB — PROCALCITONIN: Procalcitonin: <0.1 ng/mL

## 2020-12-07 MED ORDER — IPRATROPIUM-ALBUTEROL 0.5-2.5 (3) MG/3ML IN SOLN: 3.0000 mL | Freq: Once | RESPIRATORY_TRACT | Status: DC

## 2020-12-07 MED ORDER — IPRATROPIUM-ALBUTEROL 0.5-2.5 (3) MG/3ML IN SOLN
3.0000 mL | Freq: Three times a day (TID) | RESPIRATORY_TRACT | Status: AC
  Administered 2020-12-07 – 2020-12-09 (×6): 3 mL via RESPIRATORY_TRACT
  Filled 2020-12-07 (×6): qty 3

## 2020-12-07 MED ORDER — ACETAMINOPHEN 325 MG PO TABS
650.0000 mg | ORAL_TABLET | Freq: Four times a day (QID) | ORAL | Status: DC | PRN
  Administered 2020-12-07 – 2020-12-10 (×6): 650 mg via ORAL
  Filled 2020-12-07 (×6): qty 2

## 2020-12-07 MED ORDER — DILTIAZEM HCL 25 MG/5ML IV SOLN
5.0000 mg | INTRAVENOUS | Status: DC | PRN
  Administered 2020-12-07: 5 mg via INTRAVENOUS
  Filled 2020-12-07: qty 5

## 2020-12-07 MED ORDER — ENOXAPARIN SODIUM 40 MG/0.4ML IJ SOSY
40.0000 mg | PREFILLED_SYRINGE | Freq: Every day | INTRAMUSCULAR | Status: DC
  Administered 2020-12-07 – 2020-12-11 (×5): 40 mg via SUBCUTANEOUS
  Filled 2020-12-07 (×5): qty 0.4

## 2020-12-07 MED ORDER — ONDANSETRON HCL 4 MG PO TABS: 4.0000 mg | ORAL_TABLET | Freq: Four times a day (QID) | ORAL | Status: DC | PRN

## 2020-12-07 MED ORDER — CHLORHEXIDINE GLUCONATE CLOTH 2 % EX PADS
6.0000 | MEDICATED_PAD | Freq: Every day | CUTANEOUS | Status: DC
  Administered 2020-12-07 – 2020-12-12 (×4): 6 via TOPICAL

## 2020-12-07 MED ORDER — METHYLPREDNISOLONE SODIUM SUCC 40 MG IJ SOLR
40.0000 mg | Freq: Two times a day (BID) | INTRAMUSCULAR | Status: AC
  Administered 2020-12-07 – 2020-12-08 (×2): 40 mg via INTRAVENOUS
  Filled 2020-12-07 (×2): qty 1

## 2020-12-07 MED ORDER — ONDANSETRON HCL 4 MG/2ML IJ SOLN
4.0000 mg | Freq: Four times a day (QID) | INTRAMUSCULAR | Status: DC | PRN
Start: 2020-12-07 — End: 2020-12-12

## 2020-12-07 MED ORDER — FUROSEMIDE 10 MG/ML IJ SOLN
INTRAMUSCULAR | Status: AC
  Administered 2020-12-07: 40 mg via INTRAVENOUS
  Filled 2020-12-07: qty 4

## 2020-12-07 MED ORDER — NICARDIPINE HCL IN NACL 20-0.86 MG/200ML-% IV SOLN
3.0000 mg/h | INTRAVENOUS | Status: DC
  Administered 2020-12-07 – 2020-12-08 (×5): 5 mg/h via INTRAVENOUS
  Administered 2020-12-08: 4 mg/h via INTRAVENOUS
  Administered 2020-12-08: 5 mg/h via INTRAVENOUS
  Administered 2020-12-09: 3 mg/h via INTRAVENOUS
  Filled 2020-12-07 (×9): qty 200

## 2020-12-07 MED ORDER — NITROGLYCERIN 2 % TD OINT: 1.0000 [in_us] | TOPICAL_OINTMENT | Freq: Four times a day (QID) | TRANSDERMAL | Status: DC | PRN

## 2020-12-07 MED ORDER — NITROGLYCERIN 2 % TD OINT
1.0000 [in_us] | TOPICAL_OINTMENT | Freq: Once | TRANSDERMAL | Status: AC
  Administered 2020-12-07: 1 [in_us] via TOPICAL

## 2020-12-07 MED ORDER — SODIUM CHLORIDE 0.9 % IV SOLN
500.0000 mg | INTRAVENOUS | Status: DC
  Administered 2020-12-07 – 2020-12-08 (×2): 500 mg via INTRAVENOUS
  Filled 2020-12-07 (×3): qty 500

## 2020-12-07 MED ORDER — LABETALOL HCL 5 MG/ML IV SOLN
10.0000 mg | INTRAVENOUS | Status: DC | PRN
  Administered 2020-12-07: 10 mg via INTRAVENOUS
  Filled 2020-12-07: qty 4

## 2020-12-07 MED ORDER — ACETAMINOPHEN 650 MG RE SUPP: 650.0000 mg | Freq: Four times a day (QID) | RECTAL | Status: DC | PRN

## 2020-12-07 MED ORDER — CARVEDILOL 3.125 MG PO TABS
3.1250 mg | ORAL_TABLET | Freq: Two times a day (BID) | ORAL | Status: DC
  Administered 2020-12-07 – 2020-12-08 (×2): 3.125 mg via ORAL
  Filled 2020-12-07 (×2): qty 1

## 2020-12-07 MED ORDER — DILTIAZEM HCL-DEXTROSE 125-5 MG/125ML-% IV SOLN (PREMIX): 5.0000 mg/h | INTRAVENOUS | Status: DC

## 2020-12-07 MED ORDER — SODIUM CHLORIDE 0.9 % IV SOLN
2.0000 g | INTRAVENOUS | Status: AC
  Administered 2020-12-07: 2 g via INTRAVENOUS
  Filled 2020-12-07: qty 2

## 2020-12-07 MED ORDER — ENALAPRILAT 1.25 MG/ML IV SOLN
0.6250 mg | Freq: Once | INTRAVENOUS | Status: AC
  Administered 2020-12-07: 0.625 mg via INTRAVENOUS

## 2020-12-07 MED ORDER — ENALAPRILAT 1.25 MG/ML IV SOLN
0.6250 mg | Freq: Once | INTRAVENOUS | Status: AC
  Administered 2020-12-07: 0.625 mg via INTRAVENOUS
  Filled 2020-12-07: qty 2

## 2020-12-07 MED ORDER — FUROSEMIDE 10 MG/ML IJ SOLN: 40.0000 mg | Freq: Once | INTRAMUSCULAR | Status: AC

## 2020-12-07 NOTE — Plan of Care (Signed)
Educated patient on medications

## 2020-12-07 NOTE — ED Triage Notes (Signed)
Per EMS, Pt, from work, c/o sudden onset of SOB.  Denies pain.  Pt given 125mg  Solu-Medrol IM, albuterol, and duoneb en route.   Hx of smoking.

## 2020-12-07 NOTE — ED Notes (Addendum)
Pt's daughter called and reports Pt has had an increasing cough x 1 month.  Sts the Pt moved in w/ her just prior to the symptoms starting.  Daughter is worried the Pt may have a severe allergy to her cat.   Daughter, Jeral Fruit [phone]).

## 2020-12-07 NOTE — H&P (Addendum)
History and Physical   Sharon Delgado AST:[id number] DOB: [birth year] DOA: 12/07/2020  PCP: Patient, No Pcp Per (Inactive)  Patient coming from: home via EMS  I have personally briefly reviewed patient's old medical records in [facility] EMR.  Chief Concern: Shortness of breath  HPI: Sharon Delgado is a 55 y.o. female with medical history significant for history of tobacco use, and no prior medical diagnosis and not taking any medications, presented to the emergency department via EMS for chief concerns of shortness of breath.  Patient was at work when she developed sudden onset of shortness of breath.  She denies feeling this way before.  She reports that she has improved significantly with all of the treatments in the emergency department however she was not able to pinpoint which specific treatment helped her the most.  She denies recent sick exposure, fever, new cough.  She states she occasionally has a baseline cough.  She reports it was difficult for her to inhale and exhale.  She denies any chest pain or palpitations during this episode.  She states it was only her breathing that had difficulty.  At bedside, she was able to tell me her age, location of hospital. She denies headache, vision changes, chest pain, dysphagia, dysuria, diarrhea, syncope, lost of consciousness.   She reports she only drinks 1-2 bottles of 16 oz water per day, 16 oz of tea every other day, 1 cup of coffee in the morning, 1 cup of 8 oz orange juice daily.   She denies recent intentional sick contacts.  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  She states that recently she has had to leave the house and her room several times due to the noxious fumes that she is inhaling.  However she does tell me that she still does not wear a mask when she paints.  Social history: Patient moved in with her daughter approximately 1 month ago. She smokes cigars 1-2 per day. She is still working as a  Education administrator and does not wear a mask. She denies etoh and recreational drug use.   Vaccination history: she is vaccinated for covid 19, 3 three doses of Pfizer   ROS: Constitutional: no weight change, no fever ENT/Mouth: no sore throat, no rhinorrhea Eyes: no eye pain, no vision changes Cardiovascular: no chest pain, + dyspnea,  no edema, no palpitations Respiratory: + cough, no sputum, no wheezing Gastrointestinal: no nausea, no vomiting, no diarrhea, no constipation Genitourinary: no urinary incontinence, no dysuria, no hematuria Musculoskeletal: no arthralgias, no myalgias Skin: no skin lesions, no pruritus, Neuro: + weakness, no loss of consciousness, no syncope Psych: no anxiety, no depression,no decrease appetite Heme/Lymph: no bruising, no bleeding  ED Course: Discussed with emergency medicine provider, patient requiring hospitalization for chief concerns of shortness of breath suspect secondary to heart failure exacerbation.  Initial vitals in the emergency department was remarkable for respiration rate of 16, heart rate of 95, SPO2 of 99% on BiPAP, initial blood pressure was 197/94 and has improved to 169/97.  Per note, EMS reports that patient had sudden onset of shortness of breath, and had denied any pain.  Patient was given Solu-Medrol 125 mg IV, albuterol, duo nebs on route.  Assessment/Plan  Active Problems:   Shortness of breath   Tobacco use   Acute CHF (congestive heart failure) (HCC)   Elevated troponin   Acute exacerbation of CHF (congestive heart failure) (HCC)   # Acute shortness of  breath, presumed secondary to new heart failure exacerbation versus COPD exacerbation possible, complicated by possible right lower lobe pneumonia  - While high sensitive troponin is elevated, patient does not have swelling of her lower extremity and has minimal p.o. intake of fluid I feel this may be secondary to COPD exacerbation from noxious fume inhalation long-term as a Education administrator -  Complete echo ordered - BNP is pending - Check procalcitonin - Patient is status post 1 dose per EMS, Solu-Medrol 125 mg IV per EMS and albuterol - Patient did receive 2 doses of duo nebs via EDP - Elevated troponin of 60 - Status post Lasix 40 mg IV per EDP - Azithromycin 500 mg IV, 3 doses ordered for right lower lobe pneumonia and for anti-inflammatory benefits - Solu-Medrol 40 mg IV twice daily, 2 doses ordered; duo nebs scheduled 3 times daily, 2 days - Respiratory panel, 20 pathogens ordered - Strict I's and O's  # Patient meet sepsis criteria in the strictest sense of the definition - Increased respiration rate, heart rate, leukocytosis, possible source of right lower lobe pneumonia - Blood cultures x2 ordered - UA with urine cultures ordered - Azithromycin IV and ceftriaxone 2 g IV once - Pending procalcitonin  #  Hypertensive urgency/emergency-etiology work-up in progress differentials include COPD exacerbation versus community-acquired Pneumonia - Status post enalaprilat injection 0.625 mg IV, 2 doses, furosemide 40 mg - Nitroglycerin 1 inch as needed every 6 hours for chest pain and shortness of breath, 3 doses ordered  # Elevated troponin-I suspect this is secondary to possible hypoxia causing demand ischemia in setting of new heart failure exacerbation versus COPD - We will continue to follow second troponin - No ST-T wave elevation on EKG, low clinical suspicion for ACS at this time - Continue telemetry  # Tobacco use # Occupational noxious fume exposure from pain-extensive counseling to patient that she needs to be wearing a mask when she is at work - Endorse this with sister at bedside - Patient endorses understanding and compliance  Chart reviewed.   DVT prophylaxis: TED hose, enoxaparin 40 mg subcutaneous nightly Code Status: Full code Diet: Heart healthy Family Communication: Updated sister at bedside Disposition Plan: Pending clinical course Consults called:  None at this time Admission status: Admit to inpatient, stepdown, with telemetry  History reviewed. No pertinent past medical history.  Past Surgical History:  Procedure Laterality Date   NO PAST SURGERIES     Social History:  reports that she has been smoking cigarettes and cigars. She has never used smokeless tobacco. She reports that she does not currently use alcohol. She reports that she does not use drugs.  No Known Allergies Family History  Problem Relation Age of Onset   Diabetes Mother    Hypertension Mother    COPD Father    Alcoholism Father    Family history: Family history reviewed and not pertinent  Prior to Admission medications   Not on File   Physical Exam: Vitals:   12/07/20 1250 12/07/20 1300 12/07/20 1310 12/07/20 1320  BP:  (!) 198/97 (!) 199/119 (!) 182/114  Pulse:  95 92 90  Resp:  (!) 21 (!) 23 (!) 21  Temp: 98.7 F (37.1 C)     TempSrc: Rectal     SpO2:  96% 93% 93%  Weight:      Height:       Constitutional: appears age-appropriate, NAD, calm, comfortable Eyes: PERRL, lids and conjunctivae normal ENMT: Mucous membranes are moist. Posterior pharynx clear of  any exudate or lesions. Age-appropriate dentition. Hearing appropriate Neck: normal, supple, no masses, no thyromegaly Respiratory: clear to auscultation bilaterally, no wheezing, no crackles. Normal respiratory effort. No accessory muscle use.  Cardiovascular: Regular rate and rhythm, no murmurs / rubs / gallops. No extremity edema. 2+ pedal pulses. No carotid bruits.  Abdomen: no tenderness, no masses palpated, no hepatosplenomegaly. Bowel sounds positive.  Musculoskeletal: no clubbing / cyanosis. No joint deformity upper and lower extremities. Good ROM, no contractures, no atrophy. Normal muscle tone.  Skin: no rashes, lesions, ulcers. No induration Neurologic: Sensation intact. Strength 5/5 in all 4.  Psychiatric: Normal judgment and insight. Alert and oriented x 3. Normal mood.   EKG:  independently reviewed, showing a sinus tachycardia with rate of 127, QTc 474  Chest x-ray on Admission: I personally reviewed and I agree with radiologist reading as below.  DG Chest Portable 1 View  Result Date: 12/07/2020 CLINICAL DATA:  sob EXAM: PORTABLE CHEST 1 VIEW COMPARISON:  None. FINDINGS: The heart appears enlarged. No definite large pleural effusion. No pneumothorax. Increased interstitial opacities. No lobar consolidation. No acute osseous abnormality. IMPRESSION: The heart appears enlarged with increased interstitial opacities suggestive of pulmonary edema. Electronically Signed   By: Olive Bass M.D.   On: 12/07/2020 11:37    Labs on Admission: I have personally reviewed following labs  CBC: Recent Labs  Lab 12/07/20 1111  WBC 12.4*  NEUTROABS 8.4*  HGB 17.3*  HCT 51.4*  MCV 98.5  PLT 232   Basic Metabolic Panel: Recent Labs  Lab 12/07/20 1111  NA 141  K 4.1  CL 106  CO2 26  GLUCOSE 180*  BUN 19  CREATININE 1.27*  CALCIUM 9.0   GFR: Estimated Creatinine Clearance: 45.1 mL/min (A) (by C-G formula based on SCr of 1.27 mg/dL (H)).  Liver Function Tests: Recent Labs  Lab 12/07/20 1111  AST 76*  ALT 59*  ALKPHOS 99  BILITOT 1.0  PROT 8.7*  ALBUMIN 4.0   Dr. Sedalia Muta [facility]  If 7PM-7AM, please contact overnight-coverage provider If 7AM-7PM, please contact day coverage provider [email or web address]  12/07/2020, 1:29 PM

## 2020-12-07 NOTE — ED Notes (Signed)
Pt reports increasing SOB x1 week.  Denies pain.  Reports she had a cough late last week, but it has subsided.  Pt reports smoking 1 "Black and Mild" per day.

## 2020-12-07 NOTE — ED Provider Notes (Signed)
[facility] Emergency Department Provider Note   ____________________________________________   None    (approximate)  I have reviewed the triage vital signs and the nursing notes.   HISTORY  Chief Complaint Shortness of Breath  Chief complaint shortness of breath  HPI Sharon Delgado is a 55 y.o. female with no past medical history who became short of breath.  EMS gave her a DuoNeb and was bringing her here she got improved and then worsened again.  On arrival here she was on oxygen 100% nonrebreather satting at about 91%.  She was very sweaty.  Very short of breath.  She had crackles and wheezes throughout the lungs.  Bedside ultrasound was begun but of course we had no gel and before I could find Chelle x-ray came and took a chest x-ray showing CHF.  Patient denied any chest pain or tightness.  Shortness of breath it only been going on for half an hour last with severe shortness of breath.  Patient had not had a fever or cough and again no chest pain or other complaints.         History reviewed. No pertinent past medical history.  Patient Active Problem List   Diagnosis Date Noted   Shortness of breath 12/07/2020   Tobacco use 12/07/2020   Acute CHF (congestive heart failure) (HCC) 12/07/2020   Elevated troponin 12/07/2020   Acute exacerbation of CHF (congestive heart failure) (HCC) 12/07/2020    Past Surgical History:  Procedure Laterality Date   NO PAST SURGERIES      Prior to Admission medications   Medication Sig Start Date End Date Taking? Authorizing Provider  acetaminophen (TYLENOL) 500 MG tablet Take 500-1,000 mg by mouth every 6 (six) hours as needed for mild pain or moderate pain.   Yes [provider]  aspirin EC 81 MG tablet Take 81 mg by mouth daily.   Yes [provider]  diphenhydrAMINE (BENADRYL) 25 mg capsule Take 25 mg by mouth every 6 (six) hours as needed for allergies.   Yes [provider]     Allergies Patient has no known allergies.  Family History  Problem Relation Age of Onset   Diabetes Mother    Hypertension Mother    COPD Father    Alcoholism Father     Social History Social History   Tobacco Use   Smoking status: Every Day    Types: Cigarettes, Cigars   Smokeless tobacco: Never  Vaping Use   Vaping Use: Never used  Substance Use Topics   Alcohol use: Not Currently   Drug use: No    Review of Systems  Constitutional: No fever/chills Eyes: No visual changes. ENT: No sore throat. Cardiovascular: Denies chest pain. Respiratory: shortness of breath. Gastrointestinal: No abdominal pain.  No nausea, no vomiting.  No diarrhea.  No constipation. Genitourinary: Negative for dysuria. Musculoskeletal: Negative for back pain. Skin: Negative for rash. Neurological: Negative for headaches, focal weakness   ____________________________________________   PHYSICAL EXAM:  VITAL SIGNS: ED Triage Vitals  Enc Vitals Group     BP      Pulse      Resp      Temp      Temp src      SpO2      Weight      Height      Head Circumference      Peak Flow      Pain Score      Pain Loc  Pain Edu?      Excl. in GC?     Constitutional: Alert and oriented.  Initially severely short of breath and anxious.  Also very sweaty Eyes: Conjunctivae are normal.  Head: Atraumatic. Nose: No congestion/rhinnorhea. Mouth/Throat: Mucous membranes are moist.  Oropharynx non-erythematous. Neck: No stridor. Cardiovascular: Rapid rate, regular rhythm. Grossly normal heart sounds.   Respiratory: Normal respiratory effort.  No retractions. Lungs diffuse crackles and wheezes Gastrointestinal: Soft and nontender. No distention. No abdominal bruits.  Musculoskeletal: No lower extremity tenderness nor edema.   Neurologic:  Normal speech and language. No gross focal neurologic deficits are appreciated.  Skin:  Skin is warm, sweaty and intact. No rash  noted.   ____________________________________________   LABS (all labs ordered are listed, but only abnormal results are displayed)  Labs Reviewed  COMPREHENSIVE METABOLIC PANEL - Abnormal; Notable for the following components:      Result Value   Glucose, Bld 180 (*)    Creatinine, Ser 1.27 (*)    Total Protein 8.7 (*)    AST 76 (*)    ALT 59 (*)    GFR, Estimated 50 (*)    All other components within normal limits  BRAIN NATRIURETIC PEPTIDE - Abnormal; Notable for the following components:   B Natriuretic Peptide 952.8 (*)    All other components within normal limits  CBC WITH DIFFERENTIAL/PLATELET - Abnormal; Notable for the following components:   WBC 12.4 (*)    RBC 5.22 (*)    Hemoglobin 17.3 (*)    HCT 51.4 (*)    Neutro Abs 8.4 (*)    All other components within normal limits  TROPONIN I (HIGH SENSITIVITY) - Abnormal; Notable for the following components:   Troponin I (High Sensitivity) 60 (*)    All other components within normal limits  RESP PANEL BY RT-PCR (FLU A&B, COVID) [id number]  RESPIRATORY PANEL BY PCR  CULTURE, BLOOD (ROUTINE X 2)  CULTURE, BLOOD (ROUTINE X 2)  URINE CULTURE  MRSA NEXT GEN BY PCR, NASAL  LACTIC ACID, PLASMA  LACTIC ACID, PLASMA  PROCALCITONIN  HIV ANTIBODY (ROUTINE TESTING W REFLEX)  URINALYSIS, COMPLETE (UACMP) WITH MICROSCOPIC  TROPONIN I (HIGH SENSITIVITY)   ____________________________________________  EKG EKG sinus tach rate of 127 left axis.  There are flipped T's in 1 and L.  There is poor R wave progression but no acute ST-T wave changes.  There were flipped T waves in 1 and L present in 2008 on prior EKG  ____________________________________________  RADIOLOGY Jill Poling, personally viewed and evaluated these images (plain radiographs) as part of my medical decision making, as well as reviewing the written report by the radiologist.  ED MD interpretation: Chest x-ray appears to be CHF to me.  Radiology has not  read the film yet.  Official radiology report(s): DG Chest Portable 1 View  Result Date: 12/07/2020 CLINICAL DATA:  sob EXAM: PORTABLE CHEST 1 VIEW COMPARISON:  None. FINDINGS: The heart appears enlarged. No definite large pleural effusion. No pneumothorax. Increased interstitial opacities. No lobar consolidation. No acute osseous abnormality. IMPRESSION: The heart appears enlarged with increased interstitial opacities suggestive of pulmonary edema. Electronically Signed   By: Olive Bass M.D.   On: 12/07/2020 11:37    ____________________________________________   PROCEDURES  Procedure(s) performed (including Critical Care): Critical care time 25 minutes.  This includes evaluating the patient several times reviewing her old records and interpreting her results.  I also discussed her with the hospitalist.  Procedures  ____________________________________________   INITIAL IMPRESSION / ASSESSMENT AND PLAN / ED COURSE ----------------------------------------- 11:45 AM on 12/07/2020 ----------------------------------------- Patient was given 40 of Lasix 0.625 for benazepril IV and 1 inch of Nitropaste.  Patient was put on BiPAP.  Bedside ultrasound was then performed which confirmed CHF showing search lights in the lungs.  Heart was slightly dilated but beating well.  Patient having no chest pain.  Shortness of breath at this time is resolved but her blood pressures going up higher it was up to 226.  I will give her a second dose of enalapril 0.625.  ----------------------------------------- 3:11 PM on 12/07/2020 ----------------------------------------- Patient has now been admitted.  Patient improved with blood pressure going down to 165 after the second dose of enalapril.  She is breathing much easier.  We will have to further evaluate patient's cardiac function and her troponins.  We will also have to make sure her blood pressure stays down.  Hospitalist has assumed care of her.              ____________________________________________   FINAL CLINICAL IMPRESSION(S) / ED DIAGNOSES  Final diagnoses:  Hypoxia  Acute pulmonary edema [facility])  Hypertensive emergency     ED Discharge Orders     None        Note:  This document was prepared using Dragon voice recognition software and may include unintentional dictation errors.    Arnaldo Natal, MD 12/07/20 [phone]

## 2020-12-07 NOTE — Progress Notes (Addendum)
[facility] Progress Note - no charge  Received message from nursing staff that Cardizem 5 mg IVP was given in 15 minutes after administration of medication patient's blood pressure was 202/115, 30 minutes after medication it was 216/102.  Patient endorsed to ICU/stepdown nurse that patient was told to take antihypertensives many years ago however she refused at that time.  # Hypertensive urgency/emergency in setting of history of hypertension with medication noncompliance - Cardene gtt., with goal SBP of 140-180 at this time - Labetalol 10 mg IV every 2 hours as needed for SBP greater than 170, 4 doses ordered  # Elevated high-sensitivity troponin-with positive delta on -Low clinical suspicion for ACS as patient did not endorse any chest pain or chest pressure.  And she states that her shortness of breath has almost completely resolved with the treatments given thus far. - Initial high-sensitivity troponin was 60, second value 105 - I suspect this secondary to hypertensive urgency/emergency and demand ischemia - We will trend another set of high sensitive troponin - If continues to have positive delta, I will initiate heparin GTT  Dr. Sedalia Muta [facility]

## 2020-12-07 NOTE — Progress Notes (Signed)
*  PRELIMINARY RESULTS* Echocardiogram 2D Echocardiogram has been performed.  Cristela Blue 12/07/2020, 2:02 PM

## 2020-12-08 DIAGNOSIS — Z720 Tobacco use: Secondary | ICD-10-CM

## 2020-12-08 DIAGNOSIS — I5021 Acute systolic (congestive) heart failure: Secondary | ICD-10-CM

## 2020-12-08 DIAGNOSIS — I10 Essential (primary) hypertension: Secondary | ICD-10-CM

## 2020-12-08 DIAGNOSIS — I160 Hypertensive urgency: Secondary | ICD-10-CM

## 2020-12-08 LAB — CBC
HCT: 43.4 % (ref 36.0–46.0)
Hemoglobin: 15 g/dL (ref 12.0–15.0)
MCH: 32.7 pg (ref 26.0–34.0)
MCHC: 34.6 g/dL (ref 30.0–36.0)
MCV: 94.6 fL (ref 80.0–100.0)
Platelets: 245 10*3/uL (ref 150–400)
RBC: 4.59 MIL/uL (ref 3.87–5.11)
RDW: 13.1 % (ref 11.5–15.5)
WBC: 10.8 10*3/uL — ABNORMAL HIGH (ref 4.0–10.5)
nRBC: 0 % (ref 0.0–0.2)

## 2020-12-08 LAB — BASIC METABOLIC PANEL
Anion gap: 8 (ref 5–15)
BUN: 20 mg/dL (ref 6–20)
CO2: 28 mmol/L (ref 22–32)
Calcium: 8.6 mg/dL — ABNORMAL LOW (ref 8.9–10.3)
Chloride: 104 mmol/L (ref 98–111)
Creatinine, Ser: 1.02 mg/dL — ABNORMAL HIGH (ref 0.44–1.00)
GFR, Estimated: >60 mL/min (ref >60)
Glucose, Bld: 205 mg/dL — ABNORMAL HIGH (ref 70–99)
Potassium: 3.6 mmol/L (ref 3.5–5.1)
Sodium: 140 mmol/L (ref 135–145)

## 2020-12-08 LAB — MAGNESIUM: Magnesium: 1.7 mg/dL (ref 1.7–2.4)

## 2020-12-08 MED ORDER — SACUBITRIL-VALSARTAN 24-26 MG PO TABS
1.0000 | ORAL_TABLET | Freq: Two times a day (BID) | ORAL | Status: DC
  Administered 2020-12-08 – 2020-12-09 (×3): 1 via ORAL
  Filled 2020-12-08 (×4): qty 1

## 2020-12-08 MED ORDER — FUROSEMIDE 10 MG/ML IJ SOLN
40.0000 mg | Freq: Two times a day (BID) | INTRAMUSCULAR | Status: DC
  Administered 2020-12-08 – 2020-12-10 (×4): 40 mg via INTRAVENOUS
  Filled 2020-12-08 (×4): qty 4

## 2020-12-08 MED ORDER — CARVEDILOL 6.25 MG PO TABS
6.2500 mg | ORAL_TABLET | Freq: Two times a day (BID) | ORAL | Status: DC
  Administered 2020-12-08 – 2020-12-10 (×4): 6.25 mg via ORAL
  Filled 2020-12-08 (×4): qty 1

## 2020-12-08 NOTE — Consult Note (Signed)
Cardiology Consultation:   Patient ID: Sharon Delgado MRN: [id number]; DOB: [birth year]  Admit date: 12/07/2020 Date of Consult: 12/08/2020  PCP:  Patient, No Pcp Per (Inactive)   [facility] Providers Cardiologist:  [place] rounding Click here to update MD or APP on Care Team, Refresh:1}     Patient Profile:   Sharon BlancoCarol D Delgado is a 55 y.o. female with a hx of tobacco use, who is being seen 12/08/2020 for the evaluation of shortness of breath at the request of Dr. Marylu LundAmery.  History of Present Illness:   Sharon Delgado is a 55 year old female who is a current smoker x25+ years presenting with 1 week of worsening shortness of breath.  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  She has noticed being more out of breath with lifting her paint buckets especially going upstairs.  Over the past week she has not been able to lay flat on her back due to shortness of breath.  She told her mother that she could not breathe, mother called EMS and patient was brought to the emergency room.  Upon admission, EKG showed sinus tachycardia, chest x-ray showed interstitial opacities suggestive of pulmonary edema.  She received Lasix IV 40 mg x 1.  States shortness of breath improved with Lasix dose.  Denies chest pain, palpitations.  Her blood pressure was significantly elevated with systolic in the 190s, she was started on Nitropaste and nicardipine drip.   History reviewed. No pertinent past medical history.  Past Surgical History:  Procedure Laterality Date   NO PAST SURGERIES       Home Medications:  Prior to Admission medications   Medication Sig Start Date End Date Taking? Authorizing Provider  acetaminophen (TYLENOL) 500 MG tablet Take 500-1,000 mg by mouth every 6 (six) hours as needed for mild pain or moderate pain.   Yes [provider]  aspirin EC 81 MG tablet Take 81 mg by mouth daily.   Yes [provider]  diphenhydrAMINE (BENADRYL) 25 mg capsule Take 25 mg by mouth every 6 (six) hours as needed for allergies.   Yes [provider]    Inpatient Medications: Scheduled Meds:  carvedilol  6.25 mg Oral BID WC   Chlorhexidine Gluconate Cloth  6 each Topical Daily   enoxaparin (LOVENOX) injection  40 mg Subcutaneous QHS   furosemide  40 mg Intravenous BID   ipratropium-albuterol  3 mL Nebulization Once   ipratropium-albuterol  3 mL Nebulization Once   ipratropium-albuterol  3 mL Nebulization TID   sacubitril-valsartan  1 tablet Oral BID   Continuous Infusions:  azithromycin 250 mL/hr at 12/08/20 1300   niCARDipine 5 mg/hr (12/08/20 1300)   PRN Meds: acetaminophen **OR** acetaminophen, labetalol, ondansetron **OR** ondansetron (ZOFRAN) IV  Allergies:   No Known Allergies  Social History:   Social History   Socioeconomic History   Marital status: Single    Spouse name: Not on file   Number of children: Not on file   Years of education: Not on file   Highest education level: Not on file  Occupational History   Not on file  Tobacco Use   Smoking status: Every Day    Types: Cigarettes, Cigars   Smokeless tobacco: Never  Vaping Use   Vaping Use: Never used  Substance and Sexual Activity   Alcohol use: Not Currently   Drug use: No   Sexual activity: Not on file  Other Topics Concern  Not on file  Social History Narrative   Not on file   Social Determinants of Health   Financial Resource Strain: Not on file  Food Insecurity: Not on file  Transportation Needs: Not on file  Physical Activity: Not on file  Stress: Not on file  Social Connections: Not on file  Intimate Partner Violence: Not on file    Family History:    Family History  Problem Relation Age of Onset   Diabetes Mother    Hypertension Mother    COPD Father    Alcoholism Father      ROS:  Please see the history of present illness.   All other ROS reviewed and negative.      Physical Exam/Data:   Vitals:   12/08/20 1000 12/08/20 1100 12/08/20 1200 12/08/20 1306  BP: (!) 171/71 (!) 151/111 (!) 149/76   Pulse: 83 78 75   Resp: (!) 37 (!) 21 (!) 21   Temp:      TempSrc:   Oral   SpO2: 97% 97% 97% 97%  Weight:      Height:        Intake/Output Summary (Last 24 hours) at 12/08/2020 1341 Last data filed at 12/08/2020 1300 Gross per 24 hour  Intake 1403.14 ml  Output 3575 ml  Net -2171.86 ml   Last 3 Weights 12/07/2020 12/07/2020 07/22/2016  Weight (lbs) 163 lb 12.8 oz 160 lb 160 lb  Weight (kg) 74.3 kg 72.576 kg 72.576 kg     Body mass index is 30.95 kg/m.  General:  Well nourished, well developed, in no acute distress HEENT: normal Lymph: no adenopathy Neck: no JVD Endocrine:  No thryomegaly Vascular: No carotid bruits; FA pulses 2+ bilaterally without bruits  Cardiac:  normal S1, S2; RRR; no murmur  Lungs: Diminished breath sounds at lung bases. Abd: soft, nontender, no hepatomegaly  Ext: no edema Musculoskeletal:  No deformities, BUE and BLE strength normal and equal Skin: warm and dry  Neuro:  CNs 2-12 intact, no focal abnormalities noted Psych:  Normal affect   EKG:  The EKG was personally reviewed and demonstrates: Sinus tachycardia Telemetry:  Telemetry was personally reviewed and demonstrates: Sinus rhythm  Relevant CV Studies: TTE 12/07/2020 1. Left ventricular ejection fraction, by estimation, is 35 to 40%. The  left ventricle has moderately decreased function. The left ventricle  demonstrates global hypokinesis. There is moderate left ventricular  hypertrophy. Left ventricular diastolic  parameters are consistent with Grade I diastolic dysfunction (impaired  relaxation). Elevated left atrial pressure.   2. Right ventricular systolic function is normal. The right ventricular  size is normal. Tricuspid regurgitation signal is inadequate for assessing  PA pressure.   3. Left atrial size was mildly dilated.   4. Right atrial size  was mildly dilated.   5. The mitral valve is abnormal. Mild mitral valve regurgitation. No  evidence of mitral stenosis.   6. The aortic valve was not well visualized. Aortic valve regurgitation  is trivial. No aortic stenosis is present.   7. The inferior vena cava is normal in size with greater than 50%  respiratory variability, suggesting right atrial pressure of 3 mmHg.  Laboratory Data:  High Sensitivity Troponin:   Recent Labs  Lab 12/07/20 1111 12/07/20 1437 12/07/20 1756 12/07/20 1947  TROPONINIHS 60* 105* 101* 96*     Chemistry Recent Labs  Lab 12/07/20 1111 12/08/20 0437  NA 141 140  K 4.1 3.6  CL 106 104  CO2 26 28  GLUCOSE  180* 205*  BUN 19 20  CREATININE 1.27* 1.02*  CALCIUM 9.0 8.6*  GFRNONAA 50* >60  ANIONGAP 9 8    Recent Labs  Lab 12/07/20 1111  PROT 8.7*  ALBUMIN 4.0  AST 76*  ALT 59*  ALKPHOS 99  BILITOT 1.0   Hematology Recent Labs  Lab 12/07/20 1111 12/08/20 0437  WBC 12.4* 10.8*  RBC 5.22* 4.59  HGB 17.3* 15.0  HCT 51.4* 43.4  MCV 98.5 94.6  MCH 33.1 32.7  MCHC 33.7 34.6  RDW 13.3 13.1  PLT 232 245   BNP Recent Labs  Lab 12/07/20 1111  BNP 952.8*    DDimer No results for input(s): DDIMER in the last 168 hours.   Radiology/Studies:  DG Chest Portable 1 View  Result Date: 12/07/2020 CLINICAL DATA:  sob EXAM: PORTABLE CHEST 1 VIEW COMPARISON:  None. FINDINGS: The heart appears enlarged. No definite large pleural effusion. No pneumothorax. Increased interstitial opacities. No lobar consolidation. No acute osseous abnormality. IMPRESSION: The heart appears enlarged with increased interstitial opacities suggestive of pulmonary edema. Electronically Signed   By: Olive Bass M.D.   On: 12/07/2020 11:37   ECHOCARDIOGRAM COMPLETE  Result Date: 12/07/2020    ECHOCARDIOGRAM REPORT   Patient Name:   NABEEHA BADERTSCHER Date of Exam: 12/07/2020 Medical Rec #:  [id number]      Height:       61.0 in Accession #:    [id number]     Weight:        160.0 lb Date of Birth:  06-17-1965      BSA:          1.718 m Patient Age:    54 years       BP:           169/97 mmHg Patient Gender: F              HR:           89 bpm. Exam Location:  [facility] Procedure: 2D Echo, Cardiac Doppler and Color Doppler Indications:     Dyspnea R06.00                  Elevated troponin  History:         Patient has no prior history of Echocardiogram examinations. No                  past medical history on file.  Sonographer:     Cristela Blue Referring Phys:  [id number] AMY N COX Diagnosing Phys: Yvonne Kendall MD  Sonographer Comments: Suboptimal apical window. IMPRESSIONS  1. Left ventricular ejection fraction, by estimation, is 35 to 40%. The left ventricle has moderately decreased function. The left ventricle demonstrates global hypokinesis. There is moderate left ventricular hypertrophy. Left ventricular diastolic parameters are consistent with Grade I diastolic dysfunction (impaired relaxation). Elevated left atrial pressure.  2. Right ventricular systolic function is normal. The right ventricular size is normal. Tricuspid regurgitation signal is inadequate for assessing PA pressure.  3. Left atrial size was mildly dilated.  4. Right atrial size was mildly dilated.  5. The mitral valve is abnormal. Mild mitral valve regurgitation. No evidence of mitral stenosis.  6. The aortic valve was not well visualized. Aortic valve regurgitation is trivial. No aortic stenosis is present.  7. The inferior vena cava is normal in size with greater than 50% respiratory variability, suggesting right atrial pressure of 3 mmHg. FINDINGS  Left Ventricle: Left ventricular ejection fraction, by estimation, is  35 to 40%. The left ventricle has moderately decreased function. The left ventricle demonstrates global hypokinesis. The left ventricular internal cavity size was normal in size. There is moderate left ventricular hypertrophy. Left ventricular diastolic parameters are consistent with Grade I  diastolic dysfunction (impaired relaxation). Elevated left atrial pressure. Right Ventricle: The right ventricular size is normal. No increase in right ventricular wall thickness. Right ventricular systolic function is normal. Tricuspid regurgitation signal is inadequate for assessing PA pressure. Left Atrium: Left atrial size was mildly dilated. Right Atrium: Right atrial size was mildly dilated. Pericardium: Trivial pericardial effusion is present. Mitral Valve: The mitral valve is abnormal. There is mild thickening of the mitral valve leaflet(s). Mild mitral valve regurgitation. No evidence of mitral valve stenosis. Tricuspid Valve: The tricuspid valve is not well visualized. Tricuspid valve regurgitation is not demonstrated. Aortic Valve: The aortic valve was not well visualized. Aortic valve regurgitation is trivial. No aortic stenosis is present. Aortic valve mean gradient measures 7.3 mmHg. Aortic valve peak gradient measures 12.2 mmHg. Aortic valve area, by VTI measures 1.65 cm. Pulmonic Valve: The pulmonic valve was not well visualized. Pulmonic valve regurgitation is trivial. No evidence of pulmonic stenosis. Aorta: The aortic root is normal in size and structure. Pulmonary Artery: The pulmonary artery is not well seen. Venous: The inferior vena cava is normal in size with greater than 50% respiratory variability, suggesting right atrial pressure of 3 mmHg. IAS/Shunts: The interatrial septum was not well visualized.  LEFT VENTRICLE PLAX 2D LVIDd:         4.90 cm      Diastology LVIDs:         4.28 cm      LV e' medial:    3.59 cm/s LV PW:         1.51 cm      LV E/e' medial:  22.1 LV IVS:        1.32 cm      LV e' lateral:   4.68 cm/s LVOT diam:     2.00 cm      LV E/e' lateral: 16.9 LV SV:         47 LV SV Index:   27 LVOT Area:     3.14 cm  LV Volumes (MOD) LV vol d, MOD A2C: 113.0 ml LV vol d, MOD A4C: 94.0 ml LV vol s, MOD A2C: 68.6 ml LV vol s, MOD A4C: 61.1 ml LV SV MOD A2C:     44.4 ml LV SV MOD  A4C:     94.0 ml LV SV MOD BP:      46.4 ml RIGHT VENTRICLE RV Basal diam:  3.20 cm RV S prime:     16.90 cm/s TAPSE (M-mode): 4.8 cm LEFT ATRIUM             Index       RIGHT ATRIUM           Index LA diam:        4.40 cm 2.56 cm/m  RA Area:     21.50 cm LA Vol (A2C):   64.3 ml 37.43 ml/m RA Volume:   68.30 ml  39.76 ml/m LA Vol (A4C):   60.4 ml 35.16 ml/m LA Biplane Vol: 66.1 ml 38.48 ml/m  AORTIC VALVE                    PULMONIC VALVE AV Area (Vmax):    1.42 cm     PV Vmax:  1.08 m/s AV Area (Vmean):   1.45 cm     PV Peak grad:   4.7 mmHg AV Area (VTI):     1.65 cm     RVOT Peak grad: 8 mmHg AV Vmax:           174.33 cm/s AV Vmean:          124.667 cm/s AV VTI:            0.285 m AV Peak Grad:      12.2 mmHg AV Mean Grad:      7.3 mmHg LVOT Vmax:         79.00 cm/s LVOT Vmean:        57.400 cm/s LVOT VTI:          0.150 m LVOT/AV VTI ratio: 0.53  AORTA Ao Root diam: 2.67 cm MITRAL VALVE MV Area (PHT): 5.02 cm     SHUNTS MV Decel Time: 151 msec     Systemic VTI:  0.15 m MV E velocity: 79.30 cm/s   Systemic Diam: 2.00 cm MV A velocity: 111.00 cm/s MV E/A ratio:  0.71 Christopher End MD Electronically signed by Yvonne Kendall MD Signature Date/Time: 12/07/2020/5:27:30 PM    Final      Assessment and Plan:   CHF exacerbation, EF 35 to 40% -Still appears volume overloaded with abdominal distention and orthopnea -Start Lasix 40 mg IV twice daily -Increase Coreg to 6.25 mg twice daily, start Entresto. -Plan ischemic work-up/left heart cath when euvolemic.  2.  Hypertension -Increase Coreg to 6.25 mg twice daily, start Entresto. -Titrate off nicardipine drip.  Increase Coreg and Entresto as BP tolerates. -Lasix as above  3.  Smoking -Cessation advised.  Further recommendations pending clinical course, left heart cath results.  Total encounter time 110 minutes  Greater than 50% was spent in counseling and coordination of care with the patient   Signed, Debbe Odea, MD   12/08/2020 1:41 PM

## 2020-12-08 NOTE — Progress Notes (Addendum)
PROGRESS NOTE    Sharon Delgado  JAS:[id number] DOB: 05-31-1965 DOA: 12/07/2020 PCP: Patient, No Pcp Per (Inactive)    Brief Narrative:  Sharon Delgado is a 55 y.o. female with medical history significant for history of tobacco use, and no prior medical diagnosis and not taking any medications, presented to the emergency department via EMS for chief concerns of shortness of breath.  8/31 echo completed EF 35 to 40%.  I consulted cardiology this AM.  Consultants:  cardiology  Procedures:   Antimicrobials:      Subjective: Started to feel better.  No chest pain.  Less short of breath.  Objective: Vitals:   12/08/20 0715 12/08/20 0730 12/08/20 0745 12/08/20 0800  BP: (!) 174/87 (!) 171/88 (!) 187/110 (!) 157/77  Pulse: 84 88 (!) 103 (!) 53  Resp: 17 14 19 18   Temp:    99.5 F (37.5 C)  TempSrc:    Oral  SpO2: 96% 97% 95% 96%  Weight:      Height:        Intake/Output Summary (Last 24 hours) at 12/08/2020 0954 Last data filed at 12/08/2020 0900 Gross per 24 hour  Intake 1055.71 ml  Output 3075 ml  Net -2019.29 ml   Filed Weights   12/07/20 1244 12/07/20 1402  Weight: 72.6 kg 74.3 kg    Examination:  General exam: Appears calm and comfortable  Respiratory system: Decreased breath sounds, scattered crackles Cardiovascular system: S1 & S2 heard, RRR. No  gallop Gastrointestinal system: Abdomen is nondistended, soft and nontender. No organomegaly or masses felt. Normal bowel sounds heard. Central nervous system: Alert and oriented. No focal neurological deficits. Extremities: Mild lower extremity edema worried going Skin:warm, dry Psychiatry: Judgement and insight appear normal. Mood & affect appropriate.     Data Reviewed: I have personally reviewed following labs and imaging studies  CBC: Recent Labs  Lab 12/07/20 1111 12/08/20 0437  WBC 12.4* 10.8*  NEUTROABS 8.4*  --   HGB 17.3* 15.0  HCT 51.4* 43.4  MCV 98.5 94.6  PLT 232 245   Basic Metabolic  Panel: Recent Labs  Lab 12/07/20 1111 12/08/20 0437  NA 141 140  K 4.1 3.6  CL 106 104  CO2 26 28  GLUCOSE 180* 205*  BUN 19 20  CREATININE 1.27* 1.02*  CALCIUM 9.0 8.6*   GFR: Estimated Creatinine Clearance: 56.8 mL/min (A) (by C-G formula based on SCr of 1.02 mg/dL (H)). Liver Function Tests: Recent Labs  Lab 12/07/20 1111  AST 76*  ALT 59*  ALKPHOS 99  BILITOT 1.0  PROT 8.7*  ALBUMIN 4.0   No results for input(s): LIPASE, AMYLASE in the last 168 hours. No results for input(s): AMMONIA in the last 168 hours. Coagulation Profile: No results for input(s): INR, PROTIME in the last 168 hours. Cardiac Enzymes: No results for input(s): CKTOTAL, CKMB, CKMBINDEX, TROPONINI in the last 168 hours. BNP (last 3 results) No results for input(s): PROBNP in the last 8760 hours. HbA1C: No results for input(s): HGBA1C in the last 72 hours. CBG: Recent Labs  Lab 12/07/20 1359  GLUCAP 245*   Lipid Profile: No results for input(s): CHOL, HDL, LDLCALC, TRIG, CHOLHDL, LDLDIRECT in the last 72 hours. Thyroid Function Tests: No results for input(s): TSH, T4TOTAL, FREET4, T3FREE, THYROIDAB in the last 72 hours. Anemia Panel: No results for input(s): VITAMINB12, FOLATE, FERRITIN, TIBC, IRON, RETICCTPCT in the last 72 hours. Sepsis Labs: Recent Labs  Lab 12/07/20 1111 12/07/20 1135 12/07/20 1437  PROCALCITON <0.10  --   --  LATICACIDVEN  --  1.7 1.7    Recent Results (from the past 240 hour(s))  Resp Panel by RT-PCR (Flu A&B, Covid) Nasopharyngeal Swab     Status: None   Collection Time: 12/07/20 11:42 AM   Specimen: Nasopharyngeal Swab; Nasopharyngeal(NP) swabs in vial transport medium  Result Value Ref Range Status   SARS Coronavirus 2 by RT PCR NEGATIVE NEGATIVE Final    Comment: (NOTE) SARS-CoV-2 target nucleic acids are NOT DETECTED.  The SARS-CoV-2 RNA is generally detectable in upper respiratory specimens during the acute phase of infection. The  lowest concentration of SARS-CoV-2 viral copies this assay can detect is 138 copies/mL. A negative result does not preclude SARS-Cov-2 infection and should not be used as the sole basis for treatment or other patient management decisions. A negative result may occur with  improper specimen collection/handling, submission of specimen other than nasopharyngeal swab, presence of viral mutation(s) within the areas targeted by this assay, and inadequate number of viral copies(<138 copies/mL). A negative result must be combined with clinical observations, patient history, and epidemiological information. The expected result is Negative.  Fact Sheet for Patients:  BloggerCourse.com  Fact Sheet for Healthcare Providers:  SeriousBroker.it  This test is no t yet approved or cleared by the [place] FDA and  has been authorized for detection and/or diagnosis of SARS-CoV-2 by FDA under an Emergency Use Authorization (EUA). This EUA will remain  in effect (meaning this test can be used) for the duration of the COVID-19 declaration under Section 564(b)(1) of the Act, 21 U.S.C.section 360bbb-3(b)(1), unless the authorization is terminated  or revoked sooner.       Influenza A by PCR NEGATIVE NEGATIVE Final   Influenza B by PCR NEGATIVE NEGATIVE Final    Comment: (NOTE) The Xpert Xpress SARS-CoV-2/FLU/RSV plus assay is intended as an aid in the diagnosis of influenza from Nasopharyngeal swab specimens and should not be used as a sole basis for treatment. Nasal washings and aspirates are unacceptable for Xpert Xpress SARS-CoV-2/FLU/RSV testing.  Fact Sheet for Patients: BloggerCourse.com  Fact Sheet for Healthcare Providers: SeriousBroker.it  This test is not yet approved or cleared by the [place] FDA and has been authorized for detection and/or diagnosis of SARS-CoV-2 by FDA under  an Emergency Use Authorization (EUA). This EUA will remain in effect (meaning this test can be used) for the duration of the COVID-19 declaration under Section 564(b)(1) of the Act, 21 U.S.C. section 360bbb-3(b)(1), unless the authorization is terminated or revoked.  Performed at [facility], [address] [place], [place] [address]   CULTURE, BLOOD (ROUTINE X 2) w Reflex to ID Panel     Status: None (Preliminary result)   Collection Time: 12/07/20  2:33 PM   Specimen: BLOOD  Result Value Ref Range Status   Specimen Description BLOOD LEFT ANTECUBITAL  Final   Special Requests   Final    BOTTLES DRAWN AEROBIC AND ANAEROBIC Blood Culture results may not be optimal due to an excessive volume of blood received in culture bottles   Culture   Final    NO GROWTH < 24 HOURS Performed at [facility], [address] [place], [place] [address]    Report Status PENDING  Incomplete  CULTURE, BLOOD (ROUTINE X 2) w Reflex to ID Panel     Status: None (Preliminary result)   Collection Time: 12/07/20  2:37 PM   Specimen: BLOOD  Result Value Ref Range Status   Specimen Description BLOOD BLOOD RIGHT HAND  Final   Special Requests   Final    BOTTLES DRAWN AEROBIC AND ANAEROBIC Blood Culture adequate volume   Culture   Final    NO GROWTH < 24 HOURS Performed at [facility], [address] [place], [place] [address]    Report Status PENDING  Incomplete  MRSA Next Gen by PCR, Nasal     Status: None   Collection Time: 12/07/20  4:20 PM   Specimen: Nasal Mucosa; Nasal Swab  Result Value Ref Range Status   MRSA by PCR Next Gen NOT DETECTED NOT DETECTED Final    Comment: (NOTE) The GeneXpert MRSA Assay (FDA approved for NASAL specimens only), is one component of a comprehensive MRSA colonization surveillance program. It is not intended to diagnose MRSA infection nor to guide or monitor treatment for MRSA infections. Test performance is not FDA approved in patients  less than 20 years old. Performed at [facility], [address] [place], [place] [address]          Radiology Studies: DG Chest Portable 1 View  Result Date: 12/07/2020 CLINICAL DATA:  sob EXAM: PORTABLE CHEST 1 VIEW COMPARISON:  None. FINDINGS: The heart appears enlarged. No definite large pleural effusion. No pneumothorax. Increased interstitial opacities. No lobar consolidation. No acute osseous abnormality. IMPRESSION: The heart appears enlarged with increased interstitial opacities suggestive of pulmonary edema. Electronically Signed   By: Olive Bass M.D.   On: 12/07/2020 11:37   ECHOCARDIOGRAM COMPLETE  Result Date: 12/07/2020    ECHOCARDIOGRAM REPORT   Patient Name:   JASI ARRUE Date of Exam: 12/07/2020 Medical Rec #:  [id number]      Height:       61.0 in Accession #:    [id number]     Weight:       160.0 lb Date of Birth:  05/27/1965      BSA:          1.718 m Patient Age:    54 years       BP:           169/97 mmHg Patient Gender: F              HR:           89 bpm. Exam Location:  [facility] Procedure: 2D Echo, Cardiac Doppler and Color Doppler Indications:     Dyspnea R06.00                  Elevated troponin  History:         Patient has no prior history of Echocardiogram examinations. No                  past medical history on file.  Sonographer:     Cristela Blue Referring Phys:  [id number] AMY N COX Diagnosing Phys: Yvonne Kendall MD  Sonographer Comments: Suboptimal apical window. IMPRESSIONS  1. Left ventricular ejection fraction, by estimation, is 35 to 40%. The left ventricle has moderately decreased function. The left ventricle demonstrates global hypokinesis. There is moderate left ventricular hypertrophy. Left ventricular diastolic parameters are consistent with Grade I diastolic dysfunction (impaired relaxation). Elevated left atrial pressure.  2. Right ventricular systolic function is normal. The right ventricular size is normal. Tricuspid regurgitation signal is  inadequate for assessing PA pressure.  3. Left atrial size was mildly dilated.  4. Right atrial size was mildly dilated.  5. The mitral valve is abnormal. Mild mitral valve regurgitation. No evidence of mitral stenosis.  6. The aortic valve was not well visualized. Aortic valve regurgitation is trivial. No aortic stenosis is present.  7. The inferior vena cava is normal in size with greater than 50% respiratory variability, suggesting right atrial pressure of 3 mmHg. FINDINGS  Left Ventricle: Left ventricular ejection fraction, by estimation, is 35 to 40%. The left ventricle has moderately decreased function. The left ventricle demonstrates global hypokinesis. The left ventricular internal cavity size was normal in size. There is moderate left ventricular hypertrophy. Left ventricular diastolic parameters are consistent with Grade I diastolic dysfunction (impaired relaxation). Elevated left atrial pressure. Right Ventricle: The right ventricular size is normal. No increase in right ventricular wall thickness. Right ventricular systolic function is normal. Tricuspid regurgitation signal is inadequate for assessing PA pressure. Left Atrium: Left atrial size was mildly dilated. Right Atrium: Right atrial size was mildly dilated. Pericardium: Trivial pericardial effusion is present. Mitral Valve: The mitral valve is abnormal. There is mild thickening of the mitral valve leaflet(s). Mild mitral valve regurgitation. No evidence of mitral valve stenosis. Tricuspid Valve: The tricuspid valve is not well visualized. Tricuspid valve regurgitation is not demonstrated. Aortic Valve: The aortic valve was not well visualized. Aortic valve regurgitation is trivial. No aortic stenosis is present. Aortic valve mean gradient measures 7.3 mmHg. Aortic valve peak gradient measures 12.2 mmHg. Aortic valve area, by VTI measures 1.65 cm. Pulmonic Valve: The pulmonic valve was not well visualized. Pulmonic valve regurgitation is trivial.  No evidence of pulmonic stenosis. Aorta: The aortic root is normal in size and structure. Pulmonary Artery: The pulmonary artery is not well seen. Venous: The inferior vena cava is normal in size with greater than 50% respiratory variability, suggesting right atrial pressure of 3 mmHg. IAS/Shunts: The interatrial septum was not well visualized.  LEFT VENTRICLE PLAX 2D LVIDd:         4.90 cm      Diastology LVIDs:         4.28 cm      LV e' medial:    3.59 cm/s LV PW:         1.51 cm      LV E/e' medial:  22.1 LV IVS:        1.32 cm      LV e' lateral:   4.68 cm/s LVOT diam:     2.00 cm      LV E/e' lateral: 16.9 LV SV:         47 LV SV Index:   27 LVOT Area:     3.14 cm  LV Volumes (MOD) LV vol d, MOD A2C: 113.0 ml LV vol d, MOD A4C: 94.0 ml LV vol s, MOD A2C: 68.6 ml LV vol s, MOD A4C: 61.1 ml LV SV MOD A2C:     44.4 ml LV SV MOD A4C:     94.0 ml LV SV MOD BP:      46.4 ml RIGHT VENTRICLE RV Basal diam:  3.20 cm RV S prime:     16.90 cm/s TAPSE (M-mode): 4.8 cm LEFT ATRIUM             Index       RIGHT ATRIUM           Index LA diam:        4.40 cm 2.56 cm/m  RA Area:     21.50 cm LA Vol (A2C):   64.3 ml 37.43 ml/m RA Volume:   68.30 ml  39.76 ml/m LA Vol (A4C):  60.4 ml 35.16 ml/m LA Biplane Vol: 66.1 ml 38.48 ml/m  AORTIC VALVE                    PULMONIC VALVE AV Area (Vmax):    1.42 cm     PV Vmax:        1.08 m/s AV Area (Vmean):   1.45 cm     PV Peak grad:   4.7 mmHg AV Area (VTI):     1.65 cm     RVOT Peak grad: 8 mmHg AV Vmax:           174.33 cm/s AV Vmean:          124.667 cm/s AV VTI:            0.285 m AV Peak Grad:      12.2 mmHg AV Mean Grad:      7.3 mmHg LVOT Vmax:         79.00 cm/s LVOT Vmean:        57.400 cm/s LVOT VTI:          0.150 m LVOT/AV VTI ratio: 0.53  AORTA Ao Root diam: 2.67 cm MITRAL VALVE MV Area (PHT): 5.02 cm     SHUNTS MV Decel Time: 151 msec     Systemic VTI:  0.15 m MV E velocity: 79.30 cm/s   Systemic Diam: 2.00 cm MV A velocity: 111.00 cm/s MV E/A ratio:  0.71  Christopher End MD Electronically signed by Yvonne Kendall MD Signature Date/Time: 12/07/2020/5:27:30 PM    Final         Scheduled Meds:  carvedilol  3.125 mg Oral BID WC   Chlorhexidine Gluconate Cloth  6 each Topical Daily   enoxaparin (LOVENOX) injection  40 mg Subcutaneous QHS   ipratropium-albuterol  3 mL Nebulization Once   ipratropium-albuterol  3 mL Nebulization Once   ipratropium-albuterol  3 mL Nebulization TID   Continuous Infusions:  azithromycin Stopped (12/07/20 1759)   niCARDipine 5 mg/hr (12/08/20 1610)    Assessment & Plan:   Active Problems:   Shortness of breath   Tobacco use   Acute CHF (congestive heart failure) (HCC)   Elevated troponin   Acute exacerbation of CHF (congestive heart failure) (HCC)    # acute SHF- CHF appears to be new diagnosis  Echo EF 35 to 40% BNP elevated Symptomatically slowly improving Still volume overloaded Has multiple risk factors for coronary artery disease bleeding hypertension and current smoker, does need to rule out ischemic etiology.  Cardiology consulted this a.m. plan to do ischemic work-up with left heart cath once euvolemic Doubt COPD exacerbation Doubt pneumonia Will discontinue antibiotics Discontinue steroids Continue IV Lasix Cardiology started Coreg and Entresto  I/o Daily wt    # Patient meet sepsis criteria in the strictest sense of the definition - Increased respiration rate, heart rate, leukocytosis, possible source of right lower lobe pneumonia Sepsis ruled out Procalcitonin <0.1 No need for abx Leukocytosis likely reactive Tachy likely due to acute chf Bcx penidng Dc abx     #  Hypertensive urgency/emergency- Improving Started on coreg by cards IV hydralazine On Lasix IV Started on Entresto   # Elevated troponin- Likely due to demand ischemia from acute systolic heart failure and hypertension  LV with EF 35 to 40%  Will need ischemic work-up as above  Cards following      #  Tobacco use # Occupational noxious fume exposure from pain-extensive counseling to patient counseled patient on  smoking cessation and she is interested to continuing not smoking once she gets out of the hospital.   DVT prophylaxis: Lovenox Code Status: Full Family Communication: None at bedside Disposition Plan:  Status is: Inpatient  Remains inpatient appropriate because:IV treatments appropriate due to intensity of illness or inability to take PO  Dispo: The patient is from: Home              Anticipated d/c is to: Home              Patient currently is not medically stable to d/c.   Difficult to place patient No            LOS: 1 day   Time spent: 4345 Minutes with more than 50% on COC    Lynn ItoSahar Ayah Cozzolino, MD [facility] Pager [phone]  If 7PM-7AM, please contact night-coverage 12/08/2020, 9:54 AM

## 2020-12-09 DIAGNOSIS — I160 Hypertensive urgency: Secondary | ICD-10-CM

## 2020-12-09 DIAGNOSIS — I161 Hypertensive emergency: Secondary | ICD-10-CM

## 2020-12-09 DIAGNOSIS — R778 Other specified abnormalities of plasma proteins: Secondary | ICD-10-CM

## 2020-12-09 DIAGNOSIS — J810 Acute pulmonary edema: Secondary | ICD-10-CM

## 2020-12-09 DIAGNOSIS — I420 Dilated cardiomyopathy: Secondary | ICD-10-CM

## 2020-12-09 LAB — BASIC METABOLIC PANEL
Anion gap: 10 (ref 5–15)
BUN: 18 mg/dL (ref 6–20)
CO2: 29 mmol/L (ref 22–32)
Calcium: 9 mg/dL (ref 8.9–10.3)
Chloride: 101 mmol/L (ref 98–111)
Creatinine, Ser: 1.03 mg/dL — ABNORMAL HIGH (ref 0.44–1.00)
GFR, Estimated: >60 mL/min (ref >60)
Glucose, Bld: 170 mg/dL — ABNORMAL HIGH (ref 70–99)
Potassium: 3.6 mmol/L (ref 3.5–5.1)
Sodium: 140 mmol/L (ref 135–145)

## 2020-12-09 LAB — URINALYSIS, COMPLETE (UACMP) WITH MICROSCOPIC
Bacteria, UA: NONE SEEN
Bilirubin Urine: NEGATIVE
Glucose, UA: 50 mg/dL — AB
Ketones, ur: NEGATIVE mg/dL
Leukocytes,Ua: NEGATIVE
Nitrite: NEGATIVE
Protein, ur: 30 mg/dL — AB
Specific Gravity, Urine: 1.006 (ref 1.005–1.030)
pH: 7 (ref 5.0–8.0)

## 2020-12-09 MED ORDER — SACUBITRIL-VALSARTAN 49-51 MG PO TABS
1.0000 | ORAL_TABLET | Freq: Two times a day (BID) | ORAL | Status: DC
  Administered 2020-12-09 – 2020-12-12 (×5): 1 via ORAL
  Filled 2020-12-09 (×7): qty 1

## 2020-12-09 MED ORDER — ISOSORBIDE MONONITRATE ER 30 MG PO TB24
30.0000 mg | ORAL_TABLET | Freq: Every day | ORAL | Status: DC
  Administered 2020-12-09 – 2020-12-11 (×3): 30 mg via ORAL
  Filled 2020-12-09 (×3): qty 1

## 2020-12-09 NOTE — Progress Notes (Signed)
PROGRESS NOTE    [name]  LSL:[id number] DOB: 22-May-1965 DOA: 12/07/2020 PCP: Patient, No Pcp Per (Inactive)    Brief Narrative:  Sharon Delgado is a 55 y.o. female with medical history significant for history of tobacco use, and no prior medical diagnosis and not taking any medications, presented to the emergency department via EMS for chief concerns of shortness of breath.  8/31 echo completed EF 35 to 40%.  I consulted cardiology this AM. 9/1 no cp. She reports wants the cath done . Feels sob better  Consultants:  cardiology  Procedures:   Antimicrobials:      Subjective: No dizziness or lightheadedness  Objective: Vitals:   12/09/20 0600 12/09/20 0700 12/09/20 0800 12/09/20 0900  BP: (!) 162/86 (!) 148/88 (!) 158/89 (!) 149/70  Pulse: 64 (!) 47 (!) 56 (!) 42  Resp: (!) 25 16 (!) 21 18  Temp:    98.1 F (36.7 C)  TempSrc:      SpO2: 98% 97% 99% 91%  Weight:      Height:        Intake/Output Summary (Last 24 hours) at 12/09/2020 0931 Last data filed at 12/09/2020 0600 Gross per 24 hour  Intake 1072.12 ml  Output 2700 ml  Net -1627.88 ml   Filed Weights   12/07/20 1244 12/07/20 1402 12/09/20 0500  Weight: 72.6 kg 74.3 kg 72.7 kg    Examination: Calm, NAD Decreased breath sounds no wheezing Regular S1-S2 no gallops Soft benign positive bowel sounds No edema aaxox3  Data Reviewed: I have personally reviewed following labs and imaging studies  CBC: Recent Labs  Lab 12/07/20 1111 12/08/20 0437  WBC 12.4* 10.8*  NEUTROABS 8.4*  --   HGB 17.3* 15.0  HCT 51.4* 43.4  MCV 98.5 94.6  PLT 232 245   Basic Metabolic Panel: Recent Labs  Lab 12/07/20 1111 12/08/20 0437 12/08/20 1836 12/09/20 0511  NA 141 140  --  140  K 4.1 3.6  --  3.6  CL 106 104  --  101  CO2 26 28  --  29  GLUCOSE 180* 205*  --  170*  BUN 19 20  --  18  CREATININE 1.27* 1.02*  --  1.03*  CALCIUM 9.0 8.6*  --  9.0  MG  --   --  1.7  --    GFR: Estimated Creatinine  Clearance: 55.6 mL/min (A) (by C-G formula based on SCr of 1.03 mg/dL (H)). Liver Function Tests: Recent Labs  Lab 12/07/20 1111  AST 76*  ALT 59*  ALKPHOS 99  BILITOT 1.0  PROT 8.7*  ALBUMIN 4.0   No results for input(s): LIPASE, AMYLASE in the last 168 hours. No results for input(s): AMMONIA in the last 168 hours. Coagulation Profile: No results for input(s): INR, PROTIME in the last 168 hours. Cardiac Enzymes: No results for input(s): CKTOTAL, CKMB, CKMBINDEX, TROPONINI in the last 168 hours. BNP (last 3 results) No results for input(s): PROBNP in the last 8760 hours. HbA1C: No results for input(s): HGBA1C in the last 72 hours. CBG: Recent Labs  Lab 12/07/20 1359  GLUCAP 245*   Lipid Profile: No results for input(s): CHOL, HDL, LDLCALC, TRIG, CHOLHDL, LDLDIRECT in the last 72 hours. Thyroid Function Tests: No results for input(s): TSH, T4TOTAL, FREET4, T3FREE, THYROIDAB in the last 72 hours. Anemia Panel: No results for input(s): VITAMINB12, FOLATE, FERRITIN, TIBC, IRON, RETICCTPCT in the last 72 hours. Sepsis Labs: Recent Labs  Lab 12/07/20 1111 12/07/20 1135 12/07/20  1437  PROCALCITON <0.10  --   --   LATICACIDVEN  --  1.7 1.7    Recent Results (from the past 240 hour(s))  Resp Panel by RT-PCR (Flu A&B, Covid) Nasopharyngeal Swab     Status: None   Collection Time: 12/07/20 11:42 AM   Specimen: Nasopharyngeal Swab; Nasopharyngeal(NP) swabs in vial transport medium  Result Value Ref Range Status   SARS Coronavirus 2 by RT PCR NEGATIVE NEGATIVE Final    Comment: (NOTE) SARS-CoV-2 target nucleic acids are NOT DETECTED.  The SARS-CoV-2 RNA is generally detectable in upper respiratory specimens during the acute phase of infection. The lowest concentration of SARS-CoV-2 viral copies this assay can detect is 138 copies/mL. A negative result does not preclude SARS-Cov-2 infection and should not be used as the sole basis for treatment or other patient management  decisions. A negative result may occur with  improper specimen collection/handling, submission of specimen other than nasopharyngeal swab, presence of viral mutation(s) within the areas targeted by this assay, and inadequate number of viral copies(<138 copies/mL). A negative result must be combined with clinical observations, patient history, and epidemiological information. The expected result is Negative.  Fact Sheet for Patients:  BloggerCourse.com  Fact Sheet for Healthcare Providers:  SeriousBroker.it  This test is no t yet approved or cleared by the [place] FDA and  has been authorized for detection and/or diagnosis of SARS-CoV-2 by FDA under an Emergency Use Authorization (EUA). This EUA will remain  in effect (meaning this test can be used) for the duration of the COVID-19 declaration under Section 564(b)(1) of the Act, 21 U.S.C.section 360bbb-3(b)(1), unless the authorization is terminated  or revoked sooner.       Influenza A by PCR NEGATIVE NEGATIVE Final   Influenza B by PCR NEGATIVE NEGATIVE Final    Comment: (NOTE) The Xpert Xpress SARS-CoV-2/FLU/RSV plus assay is intended as an aid in the diagnosis of influenza from Nasopharyngeal swab specimens and should not be used as a sole basis for treatment. Nasal washings and aspirates are unacceptable for Xpert Xpress SARS-CoV-2/FLU/RSV testing.  Fact Sheet for Patients: BloggerCourse.com  Fact Sheet for Healthcare Providers: SeriousBroker.it  This test is not yet approved or cleared by the [place] FDA and has been authorized for detection and/or diagnosis of SARS-CoV-2 by FDA under an Emergency Use Authorization (EUA). This EUA will remain in effect (meaning this test can be used) for the duration of the COVID-19 declaration under Section 564(b)(1) of the Act, 21 U.S.C. section 360bbb-3(b)(1), unless the  authorization is terminated or revoked.  Performed at [facility], [address] [place], [place] [address]   CULTURE, BLOOD (ROUTINE X 2) w Reflex to ID Panel     Status: None (Preliminary result)   Collection Time: 12/07/20  2:33 PM   Specimen: BLOOD  Result Value Ref Range Status   Specimen Description BLOOD LEFT ANTECUBITAL  Final   Special Requests   Final    BOTTLES DRAWN AEROBIC AND ANAEROBIC Blood Culture results may not be optimal due to an excessive volume of blood received in culture bottles   Culture   Final    NO GROWTH 2 DAYS Performed at [facility], [address] [place], [place] [address]    Report Status PENDING  Incomplete  CULTURE, BLOOD (ROUTINE X 2) w Reflex to ID Panel     Status: None (Preliminary result)   Collection Time: 12/07/20  2:37 PM   Specimen: BLOOD  Result Value Ref Range  Status   Specimen Description BLOOD BLOOD RIGHT HAND  Final   Special Requests   Final    BOTTLES DRAWN AEROBIC AND ANAEROBIC Blood Culture adequate volume   Culture   Final    NO GROWTH 2 DAYS Performed at [facility], [address] [place], [place] [address]    Report Status PENDING  Incomplete  MRSA Next Gen by PCR, Nasal     Status: None   Collection Time: 12/07/20  4:20 PM   Specimen: Nasal Mucosa; Nasal Swab  Result Value Ref Range Status   MRSA by PCR Next Gen NOT DETECTED NOT DETECTED Final    Comment: (NOTE) The GeneXpert MRSA Assay (FDA approved for NASAL specimens only), is one component of a comprehensive MRSA colonization surveillance program. It is not intended to diagnose MRSA infection nor to guide or monitor treatment for MRSA infections. Test performance is not FDA approved in patients less than 45 years old. Performed at [facility], [address] [place], [place] [address]          Radiology Studies: DG Chest Portable 1 View  Result Date: 12/07/2020 CLINICAL DATA:  sob EXAM: PORTABLE CHEST 1  VIEW COMPARISON:  None. FINDINGS: The heart appears enlarged. No definite large pleural effusion. No pneumothorax. Increased interstitial opacities. No lobar consolidation. No acute osseous abnormality. IMPRESSION: The heart appears enlarged with increased interstitial opacities suggestive of pulmonary edema. Electronically Signed   By: Olive Bass M.D.   On: 12/07/2020 11:37   ECHOCARDIOGRAM COMPLETE  Result Date: 12/07/2020    ECHOCARDIOGRAM REPORT   Patient Name:   Sharon Delgado Date of Exam: 12/07/2020 Medical Rec #:  [id number]      Height:       61.0 in Accession #:    [id number]     Weight:       160.0 lb Date of Birth:  01-21-1966      BSA:          1.718 m Patient Age:    54 years       BP:           169/97 mmHg Patient Gender: F              HR:           89 bpm. Exam Location:  [facility] Procedure: 2D Echo, Cardiac Doppler and Color Doppler Indications:     Dyspnea R06.00                  Elevated troponin  History:         Patient has no prior history of Echocardiogram examinations. No                  past medical history on file.  Sonographer:     Cristela Blue Referring Phys:  [id number] AMY N COX Diagnosing Phys: Yvonne Kendall MD  Sonographer Comments: Suboptimal apical window. IMPRESSIONS  1. Left ventricular ejection fraction, by estimation, is 35 to 40%. The left ventricle has moderately decreased function. The left ventricle demonstrates global hypokinesis. There is moderate left ventricular hypertrophy. Left ventricular diastolic parameters are consistent with Grade I diastolic dysfunction (impaired relaxation). Elevated left atrial pressure.  2. Right ventricular systolic function is normal. The right ventricular size is normal. Tricuspid regurgitation signal is inadequate for assessing PA pressure.  3. Left atrial size was mildly dilated.  4. Right atrial size was mildly dilated.  5. The mitral valve is abnormal. Mild  mitral valve regurgitation. No evidence of mitral stenosis.  6. The aortic  valve was not well visualized. Aortic valve regurgitation is trivial. No aortic stenosis is present.  7. The inferior vena cava is normal in size with greater than 50% respiratory variability, suggesting right atrial pressure of 3 mmHg. FINDINGS  Left Ventricle: Left ventricular ejection fraction, by estimation, is 35 to 40%. The left ventricle has moderately decreased function. The left ventricle demonstrates global hypokinesis. The left ventricular internal cavity size was normal in size. There is moderate left ventricular hypertrophy. Left ventricular diastolic parameters are consistent with Grade I diastolic dysfunction (impaired relaxation). Elevated left atrial pressure. Right Ventricle: The right ventricular size is normal. No increase in right ventricular wall thickness. Right ventricular systolic function is normal. Tricuspid regurgitation signal is inadequate for assessing PA pressure. Left Atrium: Left atrial size was mildly dilated. Right Atrium: Right atrial size was mildly dilated. Pericardium: Trivial pericardial effusion is present. Mitral Valve: The mitral valve is abnormal. There is mild thickening of the mitral valve leaflet(s). Mild mitral valve regurgitation. No evidence of mitral valve stenosis. Tricuspid Valve: The tricuspid valve is not well visualized. Tricuspid valve regurgitation is not demonstrated. Aortic Valve: The aortic valve was not well visualized. Aortic valve regurgitation is trivial. No aortic stenosis is present. Aortic valve mean gradient measures 7.3 mmHg. Aortic valve peak gradient measures 12.2 mmHg. Aortic valve area, by VTI measures 1.65 cm. Pulmonic Valve: The pulmonic valve was not well visualized. Pulmonic valve regurgitation is trivial. No evidence of pulmonic stenosis. Aorta: The aortic root is normal in size and structure. Pulmonary Artery: The pulmonary artery is not well seen. Venous: The inferior vena cava is normal in size with greater than 50% respiratory  variability, suggesting right atrial pressure of 3 mmHg. IAS/Shunts: The interatrial septum was not well visualized.  LEFT VENTRICLE PLAX 2D LVIDd:         4.90 cm      Diastology LVIDs:         4.28 cm      LV e' medial:    3.59 cm/s LV PW:         1.51 cm      LV E/e' medial:  22.1 LV IVS:        1.32 cm      LV e' lateral:   4.68 cm/s LVOT diam:     2.00 cm      LV E/e' lateral: 16.9 LV SV:         47 LV SV Index:   27 LVOT Area:     3.14 cm  LV Volumes (MOD) LV vol d, MOD A2C: 113.0 ml LV vol d, MOD A4C: 94.0 ml LV vol s, MOD A2C: 68.6 ml LV vol s, MOD A4C: 61.1 ml LV SV MOD A2C:     44.4 ml LV SV MOD A4C:     94.0 ml LV SV MOD BP:      46.4 ml RIGHT VENTRICLE RV Basal diam:  3.20 cm RV S prime:     16.90 cm/s TAPSE (M-mode): 4.8 cm LEFT ATRIUM             Index       RIGHT ATRIUM           Index LA diam:        4.40 cm 2.56 cm/m  RA Area:     21.50 cm LA Vol (A2C):   64.3 ml 37.43 ml/m RA Volume:  68.30 ml  39.76 ml/m LA Vol (A4C):   60.4 ml 35.16 ml/m LA Biplane Vol: 66.1 ml 38.48 ml/m  AORTIC VALVE                    PULMONIC VALVE AV Area (Vmax):    1.42 cm     PV Vmax:        1.08 m/s AV Area (Vmean):   1.45 cm     PV Peak grad:   4.7 mmHg AV Area (VTI):     1.65 cm     RVOT Peak grad: 8 mmHg AV Vmax:           174.33 cm/s AV Vmean:          124.667 cm/s AV VTI:            0.285 m AV Peak Grad:      12.2 mmHg AV Mean Grad:      7.3 mmHg LVOT Vmax:         79.00 cm/s LVOT Vmean:        57.400 cm/s LVOT VTI:          0.150 m LVOT/AV VTI ratio: 0.53  AORTA Ao Root diam: 2.67 cm MITRAL VALVE MV Area (PHT): 5.02 cm     SHUNTS MV Decel Time: 151 msec     Systemic VTI:  0.15 m MV E velocity: 79.30 cm/s   Systemic Diam: 2.00 cm MV A velocity: 111.00 cm/s MV E/A ratio:  0.71 Christopher End MD Electronically signed by Yvonne Kendallhristopher End MD Signature Date/Time: 12/07/2020/5:27:30 PM    Final         Scheduled Meds:  carvedilol  6.25 mg Oral BID WC   Chlorhexidine Gluconate Cloth  6 each Topical Daily    enoxaparin (LOVENOX) injection  40 mg Subcutaneous QHS   furosemide  40 mg Intravenous BID   ipratropium-albuterol  3 mL Nebulization Once   ipratropium-albuterol  3 mL Nebulization Once   sacubitril-valsartan  1 tablet Oral BID   Continuous Infusions:  niCARDipine Stopped (12/09/20 0316)    Assessment & Plan:   Active Problems:   Shortness of breath   Tobacco use   Acute CHF (congestive heart failure) (HCC)   Elevated troponin   Acute exacerbation of CHF (congestive heart failure) (HCC)   Hypertensive urgency    # acute SHF- CHF appears to be new diagnosis  Echo EF 35 to 40% BNP elevated Symptomatically slowly improving Still volume overloaded Has multiple risk factors for coronary artery disease bleeding hypertension and current smoker, does need to rule out ischemic etiology.  Cardiology consulted this a.m. plan to do ischemic work-up with left heart cath once euvolemic Doubt COPD exacerbation Doubt pneumonia Will discontinue antibiotics Discontinue steroids 9/1 mildly still volume overloaded but slowly improving Coreg was increased to 6.25 mg twice daily Started on Entresto Started on Imdur 30 daily Now would like to have cardiac cath done Continue I's and O's   #Sepsis ruled out  Procalcitonin less than 0.1  No need for antibiotics  Blood cultures pending to date negative Antibiotics were discontinued WBC trended down and likely reactive      #  Hypertensive urgency/emergency- Improving Coreg dose was increased as above Continue Lasix and Entresto    # Elevated troponin- Likely due to demand ischemia from acute systolic heart failure and hypertension  LV with EF 35 to 40%  Cards following Plan for cardiac cath in a.m.    # Tobacco  use # Occupational noxious fume exposure from pain-extensive counseling to patient counseled patient on smoking cessation and she is interested to continuing not smoking once she gets out of the hospital.   DVT  prophylaxis: Lovenox Code Status: Full Family Communication: None at bedside Disposition Plan:  Status is: Inpatient  Remains inpatient appropriate because:IV treatments appropriate due to intensity of illness or inability to take PO  Dispo: The patient is from: Home              Anticipated d/c is to: Home              Patient currently is not medically stable to d/c.   Difficult to place patient No    Plan for cath in a.m.        LOS: 2 days   Time spent: 23 Minutes with more than 50% on COC    Lynn Ito, MD [facility] Pager [phone]  If 7PM-7AM, please contact night-coverage 12/09/2020, 9:31 AM

## 2020-12-09 NOTE — Progress Notes (Signed)
Progress Note  Patient Name: Sharon Delgado Date of Encounter: 12/09/2020  [facility] Cardiologist: Dr. 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  Inpatient Medications    Scheduled Meds:  carvedilol  6.25 mg Oral BID WC   Chlorhexidine Gluconate Cloth  6 each Topical Daily   enoxaparin (LOVENOX) injection  40 mg Subcutaneous QHS   furosemide  40 mg Intravenous BID   ipratropium-albuterol  3 mL Nebulization Once   ipratropium-albuterol  3 mL Nebulization Once   sacubitril-valsartan  1 tablet Oral BID   Continuous Infusions:  niCARDipine Stopped (12/09/20 0316)   PRN Meds: acetaminophen **OR** acetaminophen, labetalol, ondansetron **OR** ondansetron (ZOFRAN) IV   Vital Signs    Vitals:   12/09/20 1315 12/09/20 1330 12/09/20 1400 12/09/20 1430  BP: (!) 133/94 (!) 142/130 125/71 (!) 145/87  Pulse: (!) 41 (!) 45 75 73  Resp: 19 18 16  (!) 21  Temp: 98.5 F (36.9 C)     TempSrc:      SpO2: 99% 97% 99% 99%  Weight:      Height:        Intake/Output Summary (Last 24 hours) at 12/09/2020 1514 Last data filed at 12/09/2020 1021 Gross per 24 hour  Intake 397.63 ml  Output 1750 ml  Net -1352.37 ml   Last 3 Weights 12/09/2020 12/07/2020 12/07/2020  Weight (lbs) 160 lb 4.4 oz 163 lb 12.8 oz 160 lb  Weight (kg) 72.7 kg 74.3 kg 72.576 kg      Telemetry    Normal sinus rhythm- Personally Reviewed  ECG    - Personally Reviewed  Physical Exam   GEN: No acute distress.   Neck: No JVD Cardiac: RRR, no murmurs, rubs, or gallops.  Respiratory: Clear to auscultation bilaterally. GI: Soft, nontender, non-distended  MS: No edema; No deformity. Neuro:   Nonfocal  Psych: Normal affect   Labs    High Sensitivity Troponin:   Recent Labs  Lab 12/07/20 1111 12/07/20 1437 12/07/20 1756 12/07/20 1947  TROPONINIHS 60* 105* 101* 96*      Chemistry Recent Labs  Lab 12/07/20 1111 12/08/20 0437 12/09/20 0511  NA 141 140 140  K 4.1 3.6 3.6  CL 106 104 101  CO2 26 28 29   GLUCOSE 180* 205* 170*  BUN 19 20 18   CREATININE 1.27* 1.02* 1.03*  CALCIUM 9.0 8.6* 9.0  PROT 8.7*  --   --   ALBUMIN 4.0  --   --   AST 76*  --   --   ALT 59*  --   --   ALKPHOS 99  --   --   BILITOT 1.0  --   --   GFRNONAA 50* >60 >60  ANIONGAP 9 8 10      Hematology Recent Labs  Lab 12/07/20 1111 12/08/20 0437  WBC 12.4* 10.8*  RBC 5.22* 4.59  HGB 17.3* 15.0  HCT 51.4* 43.4  MCV 98.5 94.6  MCH 33.1 32.7  MCHC 33.7 34.6  RDW 13.3 13.1  PLT 232 245    BNP Recent Labs  Lab 12/07/20 1111  BNP 952.8*     DDimer No results for input(s): DDIMER in the last 168 hours.   Radiology  No results found.  Cardiac Studies   Echocardiogram  1. Left ventricular ejection fraction, by estimation, is 35 to 40%. The  left ventricle has moderately decreased function. The left ventricle  demonstrates global hypokinesis. There is moderate left ventricular  hypertrophy. Left ventricular diastolic  parameters are consistent with Grade I diastolic dysfunction (impaired  relaxation). Elevated left atrial pressure.   2. Right ventricular systolic function is normal. The right ventricular  size is normal. Tricuspid regurgitation signal is inadequate for assessing  PA pressure.   3. Left atrial size was mildly dilated.   4. Right atrial size was mildly dilated.   5. The mitral valve is abnormal. Mild mitral valve regurgitation. No  evidence of mitral stenosis.   6. The aortic valve was not well visualized. Aortic valve regurgitation  is trivial. No aortic stenosis is present.   7. The inferior vena cava is normal in size with greater than 50%   respiratory variability, suggesting right atrial pressure of 3 mmHg.    Patient Profile     Sharon Delgado is a 55 y.o. 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 Will recommend we increase her Entresto dosing 49/51 mg twice daily Imdur 30 daily  2.  Cardiomyopathy Etiology unclear, concerning for either hypertensive heart disease versus ischemic cardiomyopathy We have recommended cardiac catheterization given depressed ejection fraction  -She is noncommittal, would like to talk to her family first. "  Leaning that way" I have reviewed the risks, indications, and alternatives to cardiac catheterization, possible angioplasty, and stenting with the patient. Risks include but are not limited to bleeding, infection, vascular injury, stroke, myocardial infection, arrhythmia, kidney injury, radiation-related injury in the case of prolonged fluoroscopy use, emergency cardiac surgery, and death. The patient understands the risks of serious complication is 1-2 in 1000 with diagnostic cardiac cath and 1-2% or less with angioplasty/stenting.    3.  Hypertension Increase Entresto dosing, add Imdur, continue carvedilol Can titrate up on her Imdur for blood pressure control Can even add hydralazine as needed  4.  Smoking -Cessation advised.     Total encounter time more than 35 minutes  Greater than 50% was spent in counseling and coordination of care with the patient   For questions or updates, please contact [facility] Please consult [email or web address] for contact info under        Signed, Julien Nordmann, MD  12/09/2020, 3:14 PM

## 2020-12-10 ENCOUNTER — Encounter
Admission: EM | Disposition: A | Discharge status: Home / Self Care | Payer: Self-pay | Source: Home / Self Care | Attending: Internal Medicine

## 2020-12-10 ENCOUNTER — Other Ambulatory Visit: Payer: Self-pay

## 2020-12-10 DIAGNOSIS — I5023 Acute on chronic systolic (congestive) heart failure: Secondary | ICD-10-CM

## 2020-12-10 DIAGNOSIS — I2510 Atherosclerotic heart disease of native coronary artery without angina pectoris: Secondary | ICD-10-CM

## 2020-12-10 HISTORY — PX: LEFT HEART CATH AND CORONARY ANGIOGRAPHY: CATH118249

## 2020-12-10 LAB — URINE CULTURE: Culture: NO GROWTH

## 2020-12-10 LAB — BASIC METABOLIC PANEL
Anion gap: 7 (ref 5–15)
BUN: 29 mg/dL — ABNORMAL HIGH (ref 6–20)
CO2: 32 mmol/L (ref 22–32)
Calcium: 8.6 mg/dL — ABNORMAL LOW (ref 8.9–10.3)
Chloride: 99 mmol/L (ref 98–111)
Creatinine, Ser: 1.39 mg/dL — ABNORMAL HIGH (ref 0.44–1.00)
GFR, Estimated: 45 mL/min — ABNORMAL LOW (ref >60)
Glucose, Bld: 152 mg/dL — ABNORMAL HIGH (ref 70–99)
Potassium: 3.5 mmol/L (ref 3.5–5.1)
Sodium: 138 mmol/L (ref 135–145)

## 2020-12-10 SURGERY — LEFT HEART CATH AND CORONARY ANGIOGRAPHY
Anesthesia: Moderate Sedation

## 2020-12-10 MED ORDER — CARVEDILOL 12.5 MG PO TABS
12.5000 mg | ORAL_TABLET | Freq: Two times a day (BID) | ORAL | Status: DC
  Administered 2020-12-10 – 2020-12-11 (×2): 12.5 mg via ORAL
  Filled 2020-12-10 (×2): qty 1

## 2020-12-10 MED ORDER — SODIUM CHLORIDE 0.9% FLUSH: 3.0000 mL | Freq: Two times a day (BID) | INTRAVENOUS | Status: DC

## 2020-12-10 MED ORDER — VERAPAMIL HCL 2.5 MG/ML IV SOLN
INTRAVENOUS | Status: DC | PRN
  Administered 2020-12-10: 2.5 mg via INTRAVENOUS

## 2020-12-10 MED ORDER — FUROSEMIDE 20 MG PO TABS
40.0000 mg | ORAL_TABLET | Freq: Every day | ORAL | Status: DC
  Administered 2020-12-11 – 2020-12-12 (×2): 40 mg via ORAL
  Filled 2020-12-10 (×2): qty 2

## 2020-12-10 MED ORDER — ASPIRIN 81 MG PO CHEW
81.0000 mg | CHEWABLE_TABLET | ORAL | Status: AC
  Administered 2020-12-10: 81 mg via ORAL

## 2020-12-10 MED ORDER — LIDOCAINE HCL 1 % IJ SOLN
INTRAMUSCULAR | Status: AC
  Filled 2020-12-10: qty 20

## 2020-12-10 MED ORDER — HEPARIN (PORCINE) IN NACL 2000-0.9 UNIT/L-% IV SOLN
INTRAVENOUS | Status: DC | PRN
  Administered 2020-12-10: 1000 mL

## 2020-12-10 MED ORDER — HEPARIN (PORCINE) IN NACL 1000-0.9 UT/500ML-% IV SOLN
INTRAVENOUS | Status: AC
  Filled 2020-12-10: qty 1000

## 2020-12-10 MED ORDER — SODIUM CHLORIDE 0.9% FLUSH: 3.0000 mL | INTRAVENOUS | Status: DC | PRN

## 2020-12-10 MED ORDER — SODIUM CHLORIDE 0.9 % IV SOLN: 250.0000 mL | INTRAVENOUS | Status: DC | PRN

## 2020-12-10 MED ORDER — MIDAZOLAM HCL 2 MG/2ML IJ SOLN
INTRAMUSCULAR | Status: AC
  Filled 2020-12-10: qty 2

## 2020-12-10 MED ORDER — SODIUM CHLORIDE 0.9 % IV SOLN: INTRAVENOUS | Status: DC

## 2020-12-10 MED ORDER — SODIUM CHLORIDE 0.9 % IV SOLN: INTRAVENOUS | Status: AC

## 2020-12-10 MED ORDER — HEPARIN SODIUM (PORCINE) 1000 UNIT/ML IJ SOLN
INTRAMUSCULAR | Status: DC | PRN
  Administered 2020-12-10: 3500 [IU] via INTRAVENOUS

## 2020-12-10 MED ORDER — IOHEXOL 350 MG/ML SOLN
INTRAVENOUS | Status: DC | PRN
  Administered 2020-12-10: 20 mL

## 2020-12-10 MED ORDER — ASPIRIN 81 MG PO CHEW
CHEWABLE_TABLET | ORAL | Status: AC
  Filled 2020-12-10: qty 1

## 2020-12-10 MED ORDER — ACETAMINOPHEN 325 MG PO TABS
650.0000 mg | ORAL_TABLET | ORAL | Status: DC | PRN
  Administered 2020-12-10 – 2020-12-12 (×3): 650 mg via ORAL
  Filled 2020-12-10 (×3): qty 2

## 2020-12-10 MED ORDER — LABETALOL HCL 5 MG/ML IV SOLN
INTRAVENOUS | Status: AC
  Administered 2020-12-10: 10 mg via INTRAVENOUS
  Filled 2020-12-10: qty 4

## 2020-12-10 MED ORDER — HEPARIN SODIUM (PORCINE) 1000 UNIT/ML IJ SOLN
INTRAMUSCULAR | Status: AC
  Filled 2020-12-10: qty 1

## 2020-12-10 MED ORDER — LIDOCAINE HCL (PF) 1 % IJ SOLN
INTRAMUSCULAR | Status: DC | PRN
  Administered 2020-12-10: 2 mL

## 2020-12-10 MED ORDER — SODIUM CHLORIDE 0.9% FLUSH
3.0000 mL | Freq: Two times a day (BID) | INTRAVENOUS | Status: DC
  Administered 2020-12-10 – 2020-12-12 (×4): 3 mL via INTRAVENOUS

## 2020-12-10 MED ORDER — FENTANYL CITRATE (PF) 100 MCG/2ML IJ SOLN
INTRAMUSCULAR | Status: DC | PRN
  Administered 2020-12-10: 50 ug via INTRAVENOUS

## 2020-12-10 MED ORDER — MIDAZOLAM HCL 2 MG/2ML IJ SOLN
INTRAMUSCULAR | Status: DC | PRN
  Administered 2020-12-10: 1 mg via INTRAVENOUS

## 2020-12-10 MED ORDER — VERAPAMIL HCL 2.5 MG/ML IV SOLN
INTRAVENOUS | Status: AC
  Filled 2020-12-10: qty 2

## 2020-12-10 MED ORDER — FENTANYL CITRATE PF 50 MCG/ML IJ SOSY
PREFILLED_SYRINGE | INTRAMUSCULAR | Status: AC
  Filled 2020-12-10: qty 1

## 2020-12-10 SURGICAL SUPPLY — 12 items
CATH INFINITI 5 FR JL3.5 (CATHETERS) ×2 IMPLANT
CATH INFINITI 5FR JK (CATHETERS) ×2 IMPLANT
CATH INFINITI JR4 5F (CATHETERS) ×2 IMPLANT
DEVICE RAD TR BAND REGULAR (VASCULAR PRODUCTS) ×2 IMPLANT
DRAPE BRACHIAL (DRAPES) ×2 IMPLANT
GLIDESHEATH SLEND SS 6F .021 (SHEATH) ×2 IMPLANT
GUIDEWIRE INQWIRE 1.5J.035X260 (WIRE) ×1 IMPLANT
INQWIRE 1.5J .035X260CM (WIRE) ×2
PACK CARDIAC CATH (CUSTOM PROCEDURE TRAY) ×2 IMPLANT
PROTECTION STATION PRESSURIZED (MISCELLANEOUS) ×2
SET ATX SIMPLICITY (MISCELLANEOUS) ×2 IMPLANT
STATION PROTECTION PRESSURIZED (MISCELLANEOUS) ×1 IMPLANT

## 2020-12-10 NOTE — Progress Notes (Signed)
Progress Note  Patient Name: Sharon Delgado Date of Encounter: 12/10/2020  [facility] Cardiologist: Dr. Myriam Forehand  Subjective   She reports no chest pain.  Significant improvement in shortness of breath.  No orthopnea or leg edema.  Inpatient Medications    Scheduled Meds:  carvedilol  6.25 mg Oral BID WC   Chlorhexidine Gluconate Cloth  6 each Topical Daily   enoxaparin (LOVENOX) injection  40 mg Subcutaneous QHS   ipratropium-albuterol  3 mL Nebulization Once   ipratropium-albuterol  3 mL Nebulization Once   isosorbide mononitrate  30 mg Oral Daily   sacubitril-valsartan  1 tablet Oral BID   Continuous Infusions:  sodium chloride 100 mL/hr at 12/10/20 0838   niCARDipine Stopped (12/09/20 0316)   PRN Meds: acetaminophen **OR** acetaminophen, labetalol, ondansetron **OR** ondansetron (ZOFRAN) IV   Vital Signs    Vitals:   12/10/20 0600 12/10/20 0700 12/10/20 0738 12/10/20 0800  BP: (!) 175/73 (!) 150/87  (!) 137/102  Pulse: (!) 53 (!) 41 (!) 52 (!) 106  Resp: 20 20 15  (!) 21  Temp:   97.7 F (36.5 C)   TempSrc:   Oral   SpO2: 96% 100% 99% 93%  Weight:      Height:        Intake/Output Summary (Last 24 hours) at 12/10/2020 0905 Last data filed at 12/10/2020 0600 Gross per 24 hour  Intake --  Output 1250 ml  Net -1250 ml    Last 3 Weights 12/10/2020 12/09/2020 12/07/2020  Weight (lbs) 160 lb 7.9 oz 160 lb 4.4 oz 163 lb 12.8 oz  Weight (kg) 72.8 kg 72.7 kg 74.3 kg      Telemetry    Normal sinus rhythm- Personally Reviewed  ECG    - Personally Reviewed  Physical Exam   GEN: No acute distress.   Neck: No JVD Cardiac: RRR, no murmurs, rubs, or gallops.  Respiratory: Clear to auscultation bilaterally. GI: Soft, nontender, non-distended  MS: No edema; No deformity. Neuro:  Nonfocal  Psych: Normal affect   Labs    High Sensitivity Troponin:   Recent Labs  Lab 12/07/20 1111 12/07/20 1437 12/07/20 1756 12/07/20 1947  TROPONINIHS 60* 105* 101* 96*        Chemistry Recent Labs  Lab 12/07/20 1111 12/08/20 0437 12/09/20 0511 12/10/20 0608  NA 141 140 140 138  K 4.1 3.6 3.6 3.5  CL 106 104 101 99  CO2 26 28 29  32  GLUCOSE 180* 205* 170* 152*  BUN 19 20 18  29*  CREATININE 1.27* 1.02* 1.03* 1.39*  CALCIUM 9.0 8.6* 9.0 8.6*  PROT 8.7*  --   --   --   ALBUMIN 4.0  --   --   --   AST 76*  --   --   --   ALT 59*  --   --   --   ALKPHOS 99  --   --   --   BILITOT 1.0  --   --   --   GFRNONAA 50* >60 >60 45*  ANIONGAP 9 8 10 7       Hematology Recent Labs  Lab 12/07/20 1111 12/08/20 0437  WBC 12.4* 10.8*  RBC 5.22* 4.59  HGB 17.3* 15.0  HCT 51.4* 43.4  MCV 98.5 94.6  MCH 33.1 32.7  MCHC 33.7 34.6  RDW 13.3 13.1  PLT 232 245     BNP Recent Labs  Lab 12/07/20 1111  BNP 952.8*      DDimer  No results for input(s): DDIMER in the last 168 hours.   Radiology    No results found.  Cardiac Studies   Echocardiogram  1. Left ventricular ejection fraction, by estimation, is 35 to 40%. The  left ventricle has moderately decreased function. The left ventricle  demonstrates global hypokinesis. There is moderate left ventricular  hypertrophy. Left ventricular diastolic  parameters are consistent with Grade I diastolic dysfunction (impaired  relaxation). Elevated left atrial pressure.   2. Right ventricular systolic function is normal. The right ventricular  size is normal. Tricuspid regurgitation signal is inadequate for assessing  PA pressure.   3. Left atrial size was mildly dilated.   4. Right atrial size was mildly dilated.   5. The mitral valve is abnormal. Mild mitral valve regurgitation. No  evidence of mitral stenosis.   6. The aortic valve was not well visualized. Aortic valve regurgitation  is trivial. No aortic stenosis is present.   7. The inferior vena cava is normal in size with greater than 50%  respiratory variability, suggesting right atrial pressure of 3 mmHg.    Patient Profile      [name] is a 55 y.o. 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  Hold the dose of Entresto this morning given slight worsening of renal function and anticipation of cardiac catheterization. -The patient received IV furosemide 40 mg.  I discontinued furosemide due to slight worsening of renal function and in anticipation of left heart catheterization. Recommend proceeding with left heart catheterization possible PCI.  I discussed the procedure in details as well as risk and benefits. Her creatinine is mildly elevated above baseline.  Thus, I am going to hydrate her before the procedure.  We will minimize contrast.  2.  Essential hypertension: Blood pressure improved with addition of carvedilol, Entresto and Imdur.  3.  Tobacco use: Discussed the importance of smoking cessation.   Proceed with left heart catheterization this afternoon.    Total encounter time more than 35 minutes  Greater than 50% was spent in counseling and coordination of care with the patient   For questions or updates, please contact [facility] Please consult [email or web address] for contact info under        Signed, Lorine Bears, MD  12/10/2020, 9:05 AM

## 2020-12-10 NOTE — Consult Note (Signed)
    Heart Failure Nurse Navigator Note  HFrEF 35 to 40%.  Mild LVH.  Grade 1 diastolic dysfunction.  Mild biatrial enlargement.  Mild mitral regurgitation.  She presented to the emergency room with complaints of shortness of breath, she had also noted abdominal swelling orthopnea and PND.  She had no prior comorbidities.  She is single and lives with her daughter.  She does not have a PCP.  She is followed by the [facility] cardiology group.  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  She does not have insurance and states that the cost of medications would put a financial strain on her.  Have contacted Wallace Going with the pharmacy for plugging her into medication management clinic.  She has a long history of smoking.  When I asked her about what she understood about the function of her heart she understood that its pumping at 35 to 40%.  We discussed the medications that she is currently on, Coreg, furosemide and Entresto.  Discussed the reasoning behind these and pointed them out in the living with heart failure teaching booklet.  Also discussed how she is going to take care of herself at home as far as daily weights, low-sodium diet and fluid restriction.  Stressed compliance with medications.  Discussed follow-up in the outpatient heart failure clinic.  Will  continue to follow.  Tresa Endo RN [facility]

## 2020-12-10 NOTE — Progress Notes (Signed)
PROGRESS NOTE    Sharon Delgado  AYT:[id number] DOB: 10/29/1965 DOA: 12/07/2020 PCP: Patient, No Pcp Per (Inactive)    Brief Narrative:  Sharon Delgado is a 55 y.o. female with medical history significant for history of tobacco use, and no prior medical diagnosis and not taking any medications, presented to the emergency department via EMS for chief concerns of shortness of breath.  8/31 echo completed EF 35 to 40%.  I consulted cardiology this AM. 9/1 no cp. She reports wants the cath done . Feels sob better 9/2- plan for cath today. No cp or sob  Consultants:  cardiology  Procedures:   Antimicrobials:      Subjective: As above  Objective: Vitals:   12/10/20 1530 12/10/20 1539 12/10/20 1545 12/10/20 1600  BP: (!) 168/107 (!) 139/99 (!) 133/116 121/83  Pulse: 64 96 74 64  Resp: 16 20 19  (!) 23  Temp:      TempSrc:      SpO2: 91% 93% 93% 94%  Weight:      Height:        Intake/Output Summary (Last 24 hours) at 12/10/2020 1618 Last data filed at 12/10/2020 1300 Gross per 24 hour  Intake 436.47 ml  Output 500 ml  Net -63.53 ml   Filed Weights   12/07/20 1402 12/09/20 0500 12/10/20 0500  Weight: 74.3 kg 72.7 kg 72.8 kg    Examination: Pleasant, NAD CTA no wheeze rales rhonchi's Regular s1/s2 no gallop Soft benign +bs No edema aaxoxo3  Data Reviewed: I have personally reviewed following labs and imaging studies  CBC: Recent Labs  Lab 12/07/20 1111 12/08/20 0437  WBC 12.4* 10.8*  NEUTROABS 8.4*  --   HGB 17.3* 15.0  HCT 51.4* 43.4  MCV 98.5 94.6  PLT 232 245   Basic Metabolic Panel: Recent Labs  Lab 12/07/20 1111 12/08/20 0437 12/08/20 1836 12/09/20 0511 12/10/20 0608  NA 141 140  --  140 138  K 4.1 3.6  --  3.6 3.5  CL 106 104  --  101 99  CO2 26 28  --  29 32  GLUCOSE 180* 205*  --  170* 152*  BUN 19 20  --  18 29*  CREATININE 1.27* 1.02*  --  1.03* 1.39*  CALCIUM 9.0 8.6*  --  9.0 8.6*  MG  --   --  1.7  --   --    GFR: Estimated  Creatinine Clearance: 41.2 mL/min (A) (by C-G formula based on SCr of 1.39 mg/dL (H)). Liver Function Tests: Recent Labs  Lab 12/07/20 1111  AST 76*  ALT 59*  ALKPHOS 99  BILITOT 1.0  PROT 8.7*  ALBUMIN 4.0   No results for input(s): LIPASE, AMYLASE in the last 168 hours. No results for input(s): AMMONIA in the last 168 hours. Coagulation Profile: No results for input(s): INR, PROTIME in the last 168 hours. Cardiac Enzymes: No results for input(s): CKTOTAL, CKMB, CKMBINDEX, TROPONINI in the last 168 hours. BNP (last 3 results) No results for input(s): PROBNP in the last 8760 hours. HbA1C: No results for input(s): HGBA1C in the last 72 hours. CBG: Recent Labs  Lab 12/07/20 1359  GLUCAP 245*   Lipid Profile: No results for input(s): CHOL, HDL, LDLCALC, TRIG, CHOLHDL, LDLDIRECT in the last 72 hours. Thyroid Function Tests: No results for input(s): TSH, T4TOTAL, FREET4, T3FREE, THYROIDAB in the last 72 hours. Anemia Panel: No results for input(s): VITAMINB12, FOLATE, FERRITIN, TIBC, IRON, RETICCTPCT in the last 72 hours. Sepsis  Labs: Recent Labs  Lab 12/07/20 1111 12/07/20 1135 12/07/20 1437  PROCALCITON <0.10  --   --   LATICACIDVEN  --  1.7 1.7    Recent Results (from the past 240 hour(s))  Resp Panel by RT-PCR (Flu A&B, Covid) Nasopharyngeal Swab     Status: None   Collection Time: 12/07/20 11:42 AM   Specimen: Nasopharyngeal Swab; Nasopharyngeal(NP) swabs in vial transport medium  Result Value Ref Range Status   SARS Coronavirus 2 by RT PCR NEGATIVE NEGATIVE Final    Comment: (NOTE) SARS-CoV-2 target nucleic acids are NOT DETECTED.  The SARS-CoV-2 RNA is generally detectable in upper respiratory specimens during the acute phase of infection. The lowest concentration of SARS-CoV-2 viral copies this assay can detect is 138 copies/mL. A negative result does not preclude SARS-Cov-2 infection and should not be used as the sole basis for treatment or other patient  management decisions. A negative result may occur with  improper specimen collection/handling, submission of specimen other than nasopharyngeal swab, presence of viral mutation(s) within the areas targeted by this assay, and inadequate number of viral copies(<138 copies/mL). A negative result must be combined with clinical observations, patient history, and epidemiological information. The expected result is Negative.  Fact Sheet for Patients:  BloggerCourse.com  Fact Sheet for Healthcare Providers:  SeriousBroker.it  This test is no t yet approved or cleared by the [place] FDA and  has been authorized for detection and/or diagnosis of SARS-CoV-2 by FDA under an Emergency Use Authorization (EUA). This EUA will remain  in effect (meaning this test can be used) for the duration of the COVID-19 declaration under Section 564(b)(1) of the Act, 21 U.S.C.section 360bbb-3(b)(1), unless the authorization is terminated  or revoked sooner.       Influenza A by PCR NEGATIVE NEGATIVE Final   Influenza B by PCR NEGATIVE NEGATIVE Final    Comment: (NOTE) The Xpert Xpress SARS-CoV-2/FLU/RSV plus assay is intended as an aid in the diagnosis of influenza from Nasopharyngeal swab specimens and should not be used as a sole basis for treatment. Nasal washings and aspirates are unacceptable for Xpert Xpress SARS-CoV-2/FLU/RSV testing.  Fact Sheet for Patients: BloggerCourse.com  Fact Sheet for Healthcare Providers: SeriousBroker.it  This test is not yet approved or cleared by the [place] FDA and has been authorized for detection and/or diagnosis of SARS-CoV-2 by FDA under an Emergency Use Authorization (EUA). This EUA will remain in effect (meaning this test can be used) for the duration of the COVID-19 declaration under Section 564(b)(1) of the Act, 21 U.S.C. section 360bbb-3(b)(1),  unless the authorization is terminated or revoked.  Performed at [facility], [address] [place], [place] [address]   CULTURE, BLOOD (ROUTINE X 2) w Reflex to ID Panel     Status: None (Preliminary result)   Collection Time: 12/07/20  2:33 PM   Specimen: BLOOD  Result Value Ref Range Status   Specimen Description BLOOD LEFT ANTECUBITAL  Final   Special Requests   Final    BOTTLES DRAWN AEROBIC AND ANAEROBIC Blood Culture results may not be optimal due to an excessive volume of blood received in culture bottles   Culture   Final    NO GROWTH 3 DAYS Performed at [facility], [address] [place], [place] [address]    Report Status PENDING  Incomplete  CULTURE, BLOOD (ROUTINE X 2) w Reflex to ID Panel     Status: None (Preliminary result)   Collection Time: 12/07/20  2:37  PM   Specimen: BLOOD  Result Value Ref Range Status   Specimen Description BLOOD BLOOD RIGHT HAND  Final   Special Requests   Final    BOTTLES DRAWN AEROBIC AND ANAEROBIC Blood Culture adequate volume   Culture   Final    NO GROWTH 3 DAYS Performed at [facility], [address], [place], [place] [address]    Report Status PENDING  Incomplete  MRSA Next Gen by PCR, Nasal     Status: None   Collection Time: 12/07/20  4:20 PM   Specimen: Nasal Mucosa; Nasal Swab  Result Value Ref Range Status   MRSA by PCR Next Gen NOT DETECTED NOT DETECTED Final    Comment: (NOTE) The GeneXpert MRSA Assay (FDA approved for NASAL specimens only), is one component of a comprehensive MRSA colonization surveillance program. It is not intended to diagnose MRSA infection nor to guide or monitor treatment for MRSA infections. Test performance is not FDA approved in patients less than 22 years old. Performed at [facility], [address], [place], [place] [address]   Urine Culture     Status: None   Collection Time: 12/09/20  5:11 AM   Specimen: Urine, Clean Catch  Result Value  Ref Range Status   Specimen Description   Final    URINE, CLEAN CATCH Performed at [facility], [address], [place], [place] [address]    Special Requests   Final    NONE Performed at [facility], [address], [place], [place] [address]    Culture   Final    NO GROWTH Performed at [facility], [address], [place], [place] [address]    Report Status 12/10/2020 FINAL  Final         Radiology Studies: CARDIAC CATHETERIZATION  Result Date: 12/10/2020   Prox RCA to Mid RCA lesion is 40% stenosed.   Mid RCA to Dist RCA lesion is 30% stenosed.   Prox Cx to Mid Cx lesion is 40% stenosed. 1.  Mild to moderate nonobstructive coronary artery disease 2.  Left ventricular angiography was not performed.  EF was moderately reduced by echo. 3.  High normal left ventricular end-diastolic pressure of 12 mmHg. Recommendations: Recommend medical therapy for nonischemic cardiomyopathy which is likely due to hypertensive heart disease.  I increase the dose of carvedilol.  Uptitrate Entresto as tolerated by blood pressure. No need for IV diuretics and we will plan on starting furosemide 40 mg by mouth tomorrow.        Scheduled Meds:  aspirin       [MAR Hold] carvedilol  6.25 mg Oral BID WC   [MAR Hold] Chlorhexidine Gluconate Cloth  6 each Topical Daily   [MAR Hold] enoxaparin (LOVENOX) injection  40 mg Subcutaneous QHS   [MAR Hold] ipratropium-albuterol  3 mL Nebulization Once   [MAR Hold] ipratropium-albuterol  3 mL Nebulization Once   [MAR Hold] isosorbide mononitrate  30 mg Oral Daily   [MAR Hold] sacubitril-valsartan  1 tablet Oral BID   sodium chloride flush  3 mL Intravenous Q12H   Continuous Infusions:  sodium chloride     sodium chloride 100 mL/hr at 12/10/20 1430   sodium chloride 100 mL/hr at 12/10/20 1300   niCARDipine Stopped (12/09/20 0316)    Assessment & Plan:   Active Problems:   Shortness of breath   Tobacco use   Acute CHF  (congestive heart failure) (HCC)   Elevated troponin   Acute  exacerbation of CHF (congestive heart failure) (HCC)   Hypertensive urgency    # acute SHF- CHF appears to be new diagnosis  Echo EF 35 to 40% BNP elevated Symptomatically slowly improving Still volume overloaded Has multiple risk factors for coronary artery disease bleeding hypertension and current smoker, does need to rule out ischemic etiology.  Cardiology consulted this a.m. plan to do ischemic work-up with left heart cath once euvolemic Doubt COPD exacerbation Doubt pneumonia Will discontinue antibiotics Discontinue steroids 9/1 mildly still volume overloaded but slowly improving Coreg was increased to 6.25 mg twice daily Started on Entresto Started on Imdur 30 daily Now would like to have cardiac cath done Continue I's and O's 9/2 plan for cardiac cath today Holding am entresto this morning give slight worsening of renal function and anticipation of cardiac cath Iv lasix d/c'd    #Sepsis ruled out  Procalcitonin less than 0.1  No need for antibiotics  Blood cultures pending to date negative Antibiotics were discontinued 9/2 WBC trended down likely reactive    AKI- prerenal from diuresis  Entresto dose held for cath Minimize contrast during cath Monitor levels    #  Hypertensive urgency/emergency- Improving Coreg dose was increased as above Continue Lasix and Entresto    # Elevated troponin- Likely due to demand ischemia from acute systolic heart failure and hypertension  LV with EF 35 to 40%  Cards following Plan for cardiac cath in a.m.    # Tobacco use # Occupational noxious fume exposure from pain-extensive counseling to patient counseled patient on smoking cessation and she is interested to continuing not smoking once she gets out of the hospital.   DVT prophylaxis: Lovenox Code Status: Full Family Communication: None at bedside Disposition Plan:  Status is: Inpatient  Remains  inpatient appropriate because:IV treatments appropriate due to intensity of illness or inability to take PO  Dispo: The patient is from: Home              Anticipated d/c is to: Home              Patient currently is not medically stable to d/c.   Difficult to place patient No            LOS: 3 days   Time spent: 39 Minutes with more than 50% on COC    Lynn Ito, MD [facility] Pager [phone]  If 7PM-7AM, please contact night-coverage 12/10/2020, 4:18 PM

## 2020-12-11 LAB — BASIC METABOLIC PANEL
Anion gap: 9 (ref 5–15)
BUN: 32 mg/dL — ABNORMAL HIGH (ref 6–20)
CO2: 28 mmol/L (ref 22–32)
Calcium: 8.6 mg/dL — ABNORMAL LOW (ref 8.9–10.3)
Chloride: 104 mmol/L (ref 98–111)
Creatinine, Ser: 1.14 mg/dL — ABNORMAL HIGH (ref 0.44–1.00)
GFR, Estimated: 56 mL/min — ABNORMAL LOW (ref >60)
Glucose, Bld: 156 mg/dL — ABNORMAL HIGH (ref 70–99)
Potassium: 4 mmol/L (ref 3.5–5.1)
Sodium: 141 mmol/L (ref 135–145)

## 2020-12-11 LAB — LIPID PANEL
Cholesterol: 205 mg/dL — ABNORMAL HIGH (ref 0–200)
HDL: 34 mg/dL — ABNORMAL LOW (ref >40)
LDL Cholesterol: 138 mg/dL — ABNORMAL HIGH (ref 0–99)
Total CHOL/HDL Ratio: 6 RATIO
Triglycerides: 164 mg/dL — ABNORMAL HIGH (ref <150)
VLDL: 33 mg/dL (ref 0–40)

## 2020-12-11 LAB — MAGNESIUM: Magnesium: 2.2 mg/dL (ref 1.7–2.4)

## 2020-12-11 MED ORDER — ATORVASTATIN CALCIUM 20 MG PO TABS
40.0000 mg | ORAL_TABLET | Freq: Every day | ORAL | Status: DC
  Administered 2020-12-12: 40 mg via ORAL
  Filled 2020-12-11: qty 2

## 2020-12-11 MED ORDER — ASPIRIN EC 81 MG PO TBEC
81.0000 mg | DELAYED_RELEASE_TABLET | Freq: Every day | ORAL | Status: DC
  Administered 2020-12-12: 81 mg via ORAL
  Filled 2020-12-11: qty 1

## 2020-12-11 MED ORDER — SPIRONOLACTONE 25 MG PO TABS
25.0000 mg | ORAL_TABLET | Freq: Every day | ORAL | Status: DC
  Administered 2020-12-11 – 2020-12-12 (×2): 25 mg via ORAL
  Filled 2020-12-11 (×2): qty 1

## 2020-12-11 MED ORDER — PNEUMOCOCCAL VAC POLYVALENT 25 MCG/0.5ML IJ INJ
0.5000 mL | INJECTION | INTRAMUSCULAR | Status: AC
  Filled 2020-12-11: qty 0.5

## 2020-12-11 MED ORDER — ISOSORBIDE MONONITRATE ER 30 MG PO TB24
60.0000 mg | ORAL_TABLET | Freq: Every day | ORAL | Status: DC
  Administered 2020-12-12: 60 mg via ORAL
  Filled 2020-12-11: qty 2

## 2020-12-11 MED ORDER — CARVEDILOL 12.5 MG PO TABS
25.0000 mg | ORAL_TABLET | Freq: Two times a day (BID) | ORAL | Status: DC
  Administered 2020-12-11 – 2020-12-12 (×2): 25 mg via ORAL
  Filled 2020-12-11 (×2): qty 2

## 2020-12-11 MED ORDER — CARVEDILOL 12.5 MG PO TABS
12.5000 mg | ORAL_TABLET | Freq: Once | ORAL | Status: AC
  Administered 2020-12-11: 12.5 mg via ORAL
  Filled 2020-12-11: qty 1

## 2020-12-11 NOTE — Discharge Instructions (Signed)
You have received care from [facility] during this hospital stay and we look forward to continuing to provide you with excellent care in our office settings after you've left the hospital.  In order to assure a smoother transition to home following your discharge from the hospital, we will likely have you see one of our nurse practitioners or physician assistants within a few weeks of discharge.  Our advanced practice providers work closely with your physician in order to address all of your heart's needs in a timely manner.  More information about all of our providers may be found here: [email or web address]   Please plan to bring all of your prescriptions to your follow-up appointment and don't hesitate to contact us with questions or concerns.  [facility] - [phone] [address] [place], [place] [address] --------------------------------------  Post Catheterization Care Refer to this sheet in the next few weeks. These instructions provide you with information on caring for yourself after your procedure. Your caregiver may also give you more specific instructions. Your treatment has been planned according to current medical practices, but problems sometimes occur. Call your caregiver if you have any problems or questions after your procedure. HOME CARE INSTRUCTIONS You may shower the day after the procedure. Remove the bandage (dressing) and gently wash the site with plain soap and water. Gently pat the site dry.  Do not apply powder or lotion to the site.  Do not submerge the affected site in water for 3 to 5 days.  Inspect the site at least twice daily.  Do not flex or bend the affected arm for 24 hours.  No lifting over 5 pounds (2.3 kg) for 5 days after your procedure.  What to expect: Any bruising will usually fade within 1 to 2 weeks.  Blood that collects in the tissue  (hematoma) may be painful to the touch. It should usually decrease in size and tenderness within 1 to 2 weeks.  SEEK IMMEDIATE MEDICAL CARE IF: You have unusual pain at the radial site.  You have redness, warmth, swelling, or pain at the radial site.  You have drainage (other than a small amount of blood on the dressing).  You have chills.  You have a fever or persistent symptoms for more than 72 hours.  You have a fever and your symptoms suddenly get worse.  Your arm becomes pale, cool, tingly, or numb.  You have heavy bleeding from the site. Hold pressure on the site.  No lifting over 5 lbs for 1 week. No sexual activity for 1 week. Keep procedure site clean & dry. If you notice increased pain, swelling, bleeding or pus, call/return!  You may shower, but no soaking baths/hot tubs/pools for 5-7 days.      10 Habits of Highly Healthy People  [place] wants to help you get well and stay well.  Live a longer, healthier life by practicing healthy habits every day.  1.  Visit your primary care provider regularly. 2.  Make time for family and friends.  Healthy relationships are important. 3.  Take medications as directed by your provider. 4.  Maintain a healthy weight and a trim waistline. 5.  Eat healthy meals and snacks, rich in fruits, vegetables, whole grains, and lean proteins. 6.  Get moving every day - aim for 150 minutes of moderate physical activity each week. 7.  Don't smoke. 8.  Avoid alcohol or drink in moderation. 9.  Manage stress  through meditation or mindful relaxation. 10.  Get seven to nine hours of quality sleep each night.  Want more information on healthy habits?  To learn more about these and other healthy habits, visit DoggyResort.ch.  One of your heart tests showed weakness of the heart muscle this admission. This may make you more susceptible to weight gain from fluid retention, which can lead to symptoms that we call heart failure. Please follow these  special instructions:  1. Follow a low-salt diet - you are allowed no more than 2,000mg  of sodium per day. Watch your fluid intake. In general, you should not be taking in more than 2 liters of fluid per day (no more than 8 glasses per day). This includes sources of water in foods like soup, coffee, tea, milk, etc. 2. Weigh yourself on the same scale at same time of day and keep a log. 3. Call your doctor: (Anytime you feel any of the following symptoms)  - 3lb weight gain overnight or 5lb within a few days - Shortness of breath, with or without a dry hacking cough  - Swelling in the hands, feet or stomach  - If you have to sleep on extra pillows at night in order to breathe   IT IS IMPORTANT TO LET YOUR DOCTOR KNOW EARLY ON IF YOU ARE HAVING SYMPTOMS SO WE CAN HELP YOU!

## 2020-12-11 NOTE — Progress Notes (Signed)
PROGRESS NOTE    Sharon Delgado  XBW:[id number] DOB: [birth year] DOA: 12/07/2020 PCP: Patient, No Pcp Per (Inactive)    Brief Narrative:  Sharon Delgado is a 55 y.o. female with medical history significant for history of tobacco use, and no prior medical diagnosis and not taking any medications, presented to the emergency department via EMS for chief concerns of shortness of breath.  8/31 echo completed EF 35 to 40%.  I consulted cardiology this AM. 9/1 no cp. She reports wants the cath done . Feels sob better 9/2- plan for cath today. No cp or sob 9/3 no chest pain, shortness of breath, dizziness or any other issues  Consultants:  cardiology  Procedures:   Antimicrobials:      Subjective: As above.  No lightheadedness   Objective: Vitals:   12/11/20 0800 12/11/20 1210 12/11/20 1333 12/11/20 1555  BP: (!) 163/103 (!) 170/128 (!) 160/141 (!) 144/87  Pulse: 84 (!) 36  81  Resp: 12 19 20  (!) 22  Temp: 98 F (36.7 C) (!) 97.5 F (36.4 C)  98 F (36.7 C)  TempSrc: Oral Oral  Oral  SpO2: 95% 94%  96%  Weight:      Height:        Intake/Output Summary (Last 24 hours) at 12/11/2020 1822 Last data filed at 12/11/2020 1300 Gross per 24 hour  Intake 1245 ml  Output 250 ml  Net 995 ml   Filed Weights   12/09/20 0500 12/10/20 0500 12/11/20 0500  Weight: 72.7 kg 72.8 kg 74.1 kg    Examination: Calm, NAD CTA no wheeze rales rhonchi's Regular S1-S2 no gallops Soft benign positive bowel sounds No edema Awake and oriented grossly intact   Data Reviewed: I have personally reviewed following labs and imaging studies  CBC: Recent Labs  Lab 12/07/20 1111 12/08/20 0437  WBC 12.4* 10.8*  NEUTROABS 8.4*  --   HGB 17.3* 15.0  HCT 51.4* 43.4  MCV 98.5 94.6  PLT 232 245   Basic Metabolic Panel: Recent Labs  Lab 12/07/20 1111 12/08/20 0437 12/08/20 1836 12/09/20 0511 12/10/20 0608 12/11/20 0622  NA 141 140  --  140 138 141  K 4.1 3.6  --  3.6 3.5 4.0  CL 106  104  --  101 99 104  CO2 26 28  --  29 32 28  GLUCOSE 180* 205*  --  170* 152* 156*  BUN 19 20  --  18 29* 32*  CREATININE 1.27* 1.02*  --  1.03* 1.39* 1.14*  CALCIUM 9.0 8.6*  --  9.0 8.6* 8.6*  MG  --   --  1.7  --   --  2.2   GFR: Estimated Creatinine Clearance: 50.7 mL/min (A) (by C-G formula based on SCr of 1.14 mg/dL (H)). Liver Function Tests: Recent Labs  Lab 12/07/20 1111  AST 76*  ALT 59*  ALKPHOS 99  BILITOT 1.0  PROT 8.7*  ALBUMIN 4.0   No results for input(s): LIPASE, AMYLASE in the last 168 hours. No results for input(s): AMMONIA in the last 168 hours. Coagulation Profile: No results for input(s): INR, PROTIME in the last 168 hours. Cardiac Enzymes: No results for input(s): CKTOTAL, CKMB, CKMBINDEX, TROPONINI in the last 168 hours. BNP (last 3 results) No results for input(s): PROBNP in the last 8760 hours. HbA1C: No results for input(s): HGBA1C in the last 72 hours. CBG: Recent Labs  Lab 12/07/20 1359  GLUCAP 245*   Lipid Profile: Recent Labs  12/11/20 0622  CHOL 205*  HDL 34*  LDLCALC 138*  TRIG 164*  CHOLHDL 6.0   Thyroid Function Tests: No results for input(s): TSH, T4TOTAL, FREET4, T3FREE, THYROIDAB in the last 72 hours. Anemia Panel: No results for input(s): VITAMINB12, FOLATE, FERRITIN, TIBC, IRON, RETICCTPCT in the last 72 hours. Sepsis Labs: Recent Labs  Lab 12/07/20 1111 12/07/20 1135 12/07/20 1437  PROCALCITON <0.10  --   --   LATICACIDVEN  --  1.7 1.7    Recent Results (from the past 240 hour(s))  Resp Panel by RT-PCR (Flu A&B, Covid) Nasopharyngeal Swab     Status: None   Collection Time: 12/07/20 11:42 AM   Specimen: Nasopharyngeal Swab; Nasopharyngeal(NP) swabs in vial transport medium  Result Value Ref Range Status   SARS Coronavirus 2 by RT PCR NEGATIVE NEGATIVE Final    Comment: (NOTE) SARS-CoV-2 target nucleic acids are NOT DETECTED.  The SARS-CoV-2 RNA is generally detectable in upper respiratory specimens  during the acute phase of infection. The lowest concentration of SARS-CoV-2 viral copies this assay can detect is 138 copies/mL. A negative result does not preclude SARS-Cov-2 infection and should not be used as the sole basis for treatment or other patient management decisions. A negative result may occur with  improper specimen collection/handling, submission of specimen other than nasopharyngeal swab, presence of viral mutation(s) within the areas targeted by this assay, and inadequate number of viral copies(<138 copies/mL). A negative result must be combined with clinical observations, patient history, and epidemiological information. The expected result is Negative.  Fact Sheet for Patients:  BloggerCourse.com  Fact Sheet for Healthcare Providers:  SeriousBroker.it  This test is no t yet approved or cleared by the [place] FDA and  has been authorized for detection and/or diagnosis of SARS-CoV-2 by FDA under an Emergency Use Authorization (EUA). This EUA will remain  in effect (meaning this test can be used) for the duration of the COVID-19 declaration under Section 564(b)(1) of the Act, 21 U.S.C.section 360bbb-3(b)(1), unless the authorization is terminated  or revoked sooner.       Influenza A by PCR NEGATIVE NEGATIVE Final   Influenza B by PCR NEGATIVE NEGATIVE Final    Comment: (NOTE) The Xpert Xpress SARS-CoV-2/FLU/RSV plus assay is intended as an aid in the diagnosis of influenza from Nasopharyngeal swab specimens and should not be used as a sole basis for treatment. Nasal washings and aspirates are unacceptable for Xpert Xpress SARS-CoV-2/FLU/RSV testing.  Fact Sheet for Patients: BloggerCourse.com  Fact Sheet for Healthcare Providers: SeriousBroker.it  This test is not yet approved or cleared by the [place] FDA and has been authorized for detection  and/or diagnosis of SARS-CoV-2 by FDA under an Emergency Use Authorization (EUA). This EUA will remain in effect (meaning this test can be used) for the duration of the COVID-19 declaration under Section 564(b)(1) of the Act, 21 U.S.C. section 360bbb-3(b)(1), unless the authorization is terminated or revoked.  Performed at [facility], [address] [place], [place] [address]   CULTURE, BLOOD (ROUTINE X 2) w Reflex to ID Panel     Status: None (Preliminary result)   Collection Time: 12/07/20  2:33 PM   Specimen: BLOOD  Result Value Ref Range Status   Specimen Description BLOOD LEFT ANTECUBITAL  Final   Special Requests   Final    BOTTLES DRAWN AEROBIC AND ANAEROBIC Blood Culture results may not be optimal due to an excessive volume of blood received in culture bottles   Culture  Final    NO GROWTH 4 DAYS Performed at [facility], [address] [place], [place] [address]    Report Status PENDING  Incomplete  CULTURE, BLOOD (ROUTINE X 2) w Reflex to ID Panel     Status: None (Preliminary result)   Collection Time: 12/07/20  2:37 PM   Specimen: BLOOD  Result Value Ref Range Status   Specimen Description BLOOD BLOOD RIGHT HAND  Final   Special Requests   Final    BOTTLES DRAWN AEROBIC AND ANAEROBIC Blood Culture adequate volume   Culture   Final    NO GROWTH 4 DAYS Performed at [facility], [address] [place], [place] [address]    Report Status PENDING  Incomplete  MRSA Next Gen by PCR, Nasal     Status: None   Collection Time: 12/07/20  4:20 PM   Specimen: Nasal Mucosa; Nasal Swab  Result Value Ref Range Status   MRSA by PCR Next Gen NOT DETECTED NOT DETECTED Final    Comment: (NOTE) The GeneXpert MRSA Assay (FDA approved for NASAL specimens only), is one component of a comprehensive MRSA colonization surveillance program. It is not intended to diagnose MRSA infection nor to guide or monitor treatment for MRSA infections. Test  performance is not FDA approved in patients less than 18 years old. Performed at [facility], [address], [place], [place] [address]   Urine Culture     Status: None   Collection Time: 12/09/20  5:11 AM   Specimen: Urine, Clean Catch  Result Value Ref Range Status   Specimen Description   Final    URINE, CLEAN CATCH Performed at [facility], [address] [place], [place] [address]    Special Requests   Final    NONE Performed at [facility], [address] [place], [place] [address]    Culture   Final    NO GROWTH Performed at [facility], [address] [place], [place] [address]    Report Status 12/10/2020 FINAL  Final         Radiology Studies: CARDIAC CATHETERIZATION  Result Date: 12/10/2020   Prox RCA to Mid RCA lesion is 40% stenosed.   Mid RCA to Dist RCA lesion is 30% stenosed.   Prox Cx to Mid Cx lesion is 40% stenosed. 1.  Mild to moderate nonobstructive coronary artery disease 2.  Left ventricular angiography was not performed.  EF was moderately reduced by echo. 3.  High normal left ventricular end-diastolic pressure of 12 mmHg. Recommendations: Recommend medical therapy for nonischemic cardiomyopathy which is likely due to hypertensive heart disease.  I increase the dose of carvedilol.  Uptitrate Entresto as tolerated by blood pressure. No need for IV diuretics and we will plan on starting furosemide 40 mg by mouth tomorrow.        Scheduled Meds:  [START ON 12/12/2020] aspirin EC  81 mg Oral Daily   [START ON 12/12/2020] atorvastatin  40 mg Oral Daily   carvedilol  25 mg Oral BID WC   Chlorhexidine Gluconate Cloth  6 each Topical Daily   enoxaparin (LOVENOX) injection  40 mg Subcutaneous QHS   furosemide  40 mg Oral Daily   ipratropium-albuterol  3 mL Nebulization Once   ipratropium-albuterol  3 mL Nebulization Once   [START ON 12/12/2020] isosorbide mononitrate  60 mg Oral Daily   sacubitril-valsartan  1 tablet  Oral BID   sodium chloride flush  3 mL Intravenous Q12H  spironolactone  25 mg Oral Daily   Continuous Infusions:  sodium chloride     niCARDipine Stopped (12/09/20 0316)    Assessment & Plan:   Active Problems:   Shortness of breath   Tobacco use   Acute CHF (congestive heart failure) (HCC)   Elevated troponin   Acute exacerbation of CHF (congestive heart failure) (HCC)   Hypertensive urgency    # acute SHF- CHF appears to be new diagnosis  Echo EF 35 to 40% BNP elevated Symptomatically slowly improving Still volume overloaded Has multiple risk factors for coronary artery disease bleeding hypertension and current smoker, does need to rule out ischemic etiology.  Cardiology consulted this a.m. plan to do ischemic work-up with left heart cath once euvolemic Doubt COPD exacerbation Doubt pneumonia Will discontinue antibiotics Discontinue steroids 9/3 status post cardiac cath on 9/2 with nonobstructive disease Continue Entresto 49/51 mg twice daily Continue Lasix 40 mg daily, continue Imdur Increase Coreg to 25 mg twice daily Start Aldactone 25 mg daily  Nonischemic cardiomyopathy EF 35 to 40% Etiology likely uncontrolled hypertension Please see above for management    #Sepsis ruled out  Procalcitonin less than 0.1  No need for antibiotics  Blood cultures pending to date negative Antibiotics were discontinued 9/20 leukocytosis improved.  Likely reactive/stress   AKI- prerenal from diuresis  Creatinine stable post cath Continue to monitor    #  Hypertensive urgency/emergency- Improving Coreg dose was increased as above Continue Lasix and Entresto    # Elevated troponin- Likely due to demand ischemia from acute systolic heart failure and hypertension  LV with EF 35 to 40%  Cards following S/p cath with nonobstructive disease    # Tobacco use # Occupational noxious fume exposure from pain-extensive counseling to patient counseled patient on smoking  cessation and she is interested to continuing not smoking once she gets out of the hospital.   #Nonobstructive CAD (moderate RCA and left circumflex disease) Start aspirin and Lipitor Check fasting lipid panel  DVT prophylaxis: Lovenox Code Status: Full Family Communication: None at bedside Disposition Plan:  Status is: Inpatient  Remains inpatient appropriate because:IV treatments appropriate due to intensity of illness or inability to take PO  Dispo: The patient is from: Home              Anticipated d/c is to: Home              Patient currently is not medically stable to d/c.   Difficult to place patient No    Plan for discharge once blood pressure improves possibly tomorrow.  Medications have been titrated.        LOS: 4 days   Time spent: 30 Minutes with more than 50% on COC    Lynn Ito, MD [facility] Pager [phone]  If 7PM-7AM, please contact night-coverage 12/11/2020, 6:22 PM

## 2020-12-11 NOTE — Progress Notes (Addendum)
Progress Note  Patient Name: Sharon Delgado Date of Encounter: 12/11/2020  Primary Cardiologist: Dr. Azucena Cecil  Subjective   Denies chest pain or shortness of breath.  Denies lower extremity edema.  She does not feel her current elevated blood pressure of 160/141.  Medication changes discussed.  Postcatheterization precautions reviewed.  Inpatient Medications    Scheduled Meds:  carvedilol  12.5 mg Oral Once   carvedilol  25 mg Oral BID WC   Chlorhexidine Gluconate Cloth  6 each Topical Daily   enoxaparin (LOVENOX) injection  40 mg Subcutaneous QHS   furosemide  40 mg Oral Daily   ipratropium-albuterol  3 mL Nebulization Once   ipratropium-albuterol  3 mL Nebulization Once   [START ON 12/12/2020] isosorbide mononitrate  60 mg Oral Daily   sacubitril-valsartan  1 tablet Oral BID   sodium chloride flush  3 mL Intravenous Q12H   spironolactone  25 mg Oral Daily   Continuous Infusions:  sodium chloride     niCARDipine Stopped (12/09/20 0316)   PRN Meds: sodium chloride, acetaminophen, labetalol, ondansetron **OR** ondansetron (ZOFRAN) IV, sodium chloride flush   Vital Signs    Vitals:   12/11/20 0600 12/11/20 0800 12/11/20 1210 12/11/20 1333  BP: (!) 146/83 (!) 163/103 (!) 170/128 (!) 160/141  Pulse: 80 84 (!) 36   Resp: (!) 23 12 19 20   Temp:  98 F (36.7 C) (!) 97.5 F (36.4 C)   TempSrc:  Oral Oral   SpO2: 92% 95% 94%   Weight:      Height:        Intake/Output Summary (Last 24 hours) at 12/11/2020 1434 Last data filed at 12/11/2020 1300 Gross per 24 hour  Intake 1453.07 ml  Output 250 ml  Net 1203.07 ml   Last 3 Weights 12/11/2020 12/10/2020 12/09/2020  Weight (lbs) 163 lb 5.8 oz 160 lb 7.9 oz 160 lb 4.4 oz  Weight (kg) 74.1 kg 72.8 kg 72.7 kg      Telemetry    NSR, PVCs- Personally Reviewed  ECG    No new tracings- Personally Reviewed  Physical Exam   GEN: No acute distress.  Sitting on edge of bed. Neck: No JVD Cardiac: RRR with occasional  extrasystole, 1/6 systolic murmur.   No, rubs, or gallops. Arteriotomy sites without evidence of hematoma, infection and no report of pain with clean and dry dressing. Respiratory: Clear to auscultation bilaterally. GI: Soft, nontender, non-distended  MS: No edema; No deformity. Neuro:  Nonfocal  Psych: Normal affect   Labs    High Sensitivity Troponin:   Recent Labs  Lab 12/07/20 1111 12/07/20 1437 12/07/20 1756 12/07/20 1947  TROPONINIHS 60* 105* 101* 96*      Chemistry Recent Labs  Lab 12/07/20 1111 12/08/20 0437 12/09/20 0511 12/10/20 0608 12/11/20 0622  NA 141   < > 140 138 141  K 4.1   < > 3.6 3.5 4.0  CL 106   < > 101 99 104  CO2 26   < > 29 32 28  GLUCOSE 180*   < > 170* 152* 156*  BUN 19   < > 18 29* 32*  CREATININE 1.27*   < > 1.03* 1.39* 1.14*  CALCIUM 9.0   < > 9.0 8.6* 8.6*  PROT 8.7*  --   --   --   --   ALBUMIN 4.0  --   --   --   --   AST 76*  --   --   --   --  ALT 59*  --   --   --   --   ALKPHOS 99  --   --   --   --   BILITOT 1.0  --   --   --   --   GFRNONAA 50*   < > >60 45* 56*  ANIONGAP 9   < > 10 7 9    < > = values in this interval not displayed.     Hematology Recent Labs  Lab 12/07/20 1111 12/08/20 0437  WBC 12.4* 10.8*  RBC 5.22* 4.59  HGB 17.3* 15.0  HCT 51.4* 43.4  MCV 98.5 94.6  MCH 33.1 32.7  MCHC 33.7 34.6  RDW 13.3 13.1  PLT 232 245    BNP Recent Labs  Lab 12/07/20 1111  BNP 952.8*     DDimer No results for input(s): DDIMER in the last 168 hours.   Radiology    CARDIAC CATHETERIZATION  Result Date: 12/10/2020   Prox RCA to Mid RCA lesion is 40% stenosed.   Mid RCA to Dist RCA lesion is 30% stenosed.   Prox Cx to Mid Cx lesion is 40% stenosed. 1.  Mild to moderate nonobstructive coronary artery disease 2.  Left ventricular angiography was not performed.  EF was moderately reduced by echo. 3.  High normal left ventricular end-diastolic pressure of 12 mmHg. Recommendations: Recommend medical therapy for  nonischemic cardiomyopathy which is likely due to hypertensive heart disease.  I increase the dose of carvedilol.  Uptitrate Entresto as tolerated by blood pressure. No need for IV diuretics and we will plan on starting furosemide 40 mg by mouth tomorrow.    Cardiac Studies   LHC 12/10/20 Coronary Diagrams  Diagnostic Dominance: Right     Prox RCA to Mid RCA lesion is 40% stenosed.   Mid RCA to Dist RCA lesion is 30% stenosed.   Prox Cx to Mid Cx lesion is 40% stenosed.   1.  Mild to moderate nonobstructive coronary artery disease 2.  Left ventricular angiography was not performed.  EF was moderately reduced by echo. 3.  High normal left ventricular end-diastolic pressure of 12 mmHg.   Recommendations: Recommend medical therapy for nonischemic cardiomyopathy which is likely due to hypertensive heart disease.  I increase the dose of carvedilol.  Uptitrate Entresto as tolerated by blood pressure. No need for IV diuretics and we will plan on starting furosemide 40 mg by mouth tomorrow.  Echo  12/07/20  1. Left ventricular ejection fraction, by estimation, is 35 to 40%. The  left ventricle has moderately decreased function. The left ventricle  demonstrates global hypokinesis. There is moderate left ventricular  hypertrophy. Left ventricular diastolic  parameters are consistent with Grade I diastolic dysfunction (impaired  relaxation). Elevated left atrial pressure.   2. Right ventricular systolic function is normal. The right ventricular  size is normal. Tricuspid regurgitation signal is inadequate for assessing  PA pressure.   3. Left atrial size was mildly dilated.   4. Right atrial size was mildly dilated.   5. The mitral valve is abnormal. Mild mitral valve regurgitation. No  evidence of mitral stenosis.   6. The aortic valve was not well visualized. Aortic valve regurgitation  is trivial. No aortic stenosis is present.   7. The inferior vena cava is normal in size with greater  than 50%  respiratory variability, suggesting right atrial pressure of 3 mmHg.   Patient Profile     55 y.o. female with history of mild to moderate nonobstructive  CAD by [facility] 12/10/2020, NICM with moderately reduced LVEF 35 to 40% by echo 12/2020, hypertension, tobacco use and who is being seen today for the evaluation of shortness of breath with cardiomyopathy on echo and subsequent LHC performed with mild to moderate nonobstructive CAD and recommendation for medical therapy for nonischemic cardiomyopathy thought likely due to hypertensive heart disease.  Assessment & Plan    Acute on chronic CHF exacerbation / NICM/ Hypertensive heart dz --Reports significantly improved shortness of breath.  12/07/2020 echo with new cardiomyopathy and EF 35 to 40%.  LHC performed with mild to moderate nonobstructive CAD and medical therapy recommended for NICM likely due to hypertensive heart disease.  Appears euvolemic today thought BP remains elevated. Given ongoing elevated BP, antihypertensives were uptitrated. Given known CKD, continue to monitor renal function with daily BMET. Monitor electrolytes.  Most recent Cr stable. Continue Entresto 49-51 twice daily.   Continue carvedilol, Imdur for BP support with below recommendations. Ordered carvedilol 12.5 mg x1 for now. Increased to carvedilol 25 mg twice daily, starting at 1700 today. With ongoing elevated BP, increased dose Imdur 60mg  daily recommended.  Started on spironolactone 25 mg daily this a.m.  Continue.   Continue Lasix 40 mg daily daily. Continue to monitor I/os, daily standing weights. CHF education.  Essential Hypertension -- BP remains suboptimal.  Recommend up titration of antihypertensives as outlined above.  Goal BP 130/80 or lower.  Mild to moderate nonobstructive CAD --No chest pain.  S/p LHC and echo as above.  Post catheterization precautions and follow-up discussed with patient understanding.  We will also provide post-cath  recommendations in AVS. Continue medical management with ASA, carvedilol.  Recommend check LDL now or as OP +/- addition of statin if LDL above 70 and for aggressive risk factor modification.  Lifestyle changes recommended.  Tobacco use --Complete cessation recommended.   For questions or updates, please contact [facility] Please consult [email or web address] for contact info under        Signed, , PA-C  12/11/2020, 2:34 PM

## 2020-12-11 NOTE — Plan of Care (Signed)
  Problem: Education: Goal: Knowledge of General Education information will improve Description: Including pain rating scale, medication(s)/side effects and non-pharmacologic comfort measures Outcome: Progressing   Problem: Health Behavior/Discharge Planning: Goal: Ability to manage health-related needs will improve Outcome: Progressing   Problem: Clinical Measurements: Goal: Ability to maintain clinical measurements within normal limits will improve Outcome: Progressing Goal: Will remain free from infection Outcome: Progressing Goal: Diagnostic test results will improve Outcome: Progressing Goal: Respiratory complications will improve Outcome: Progressing Goal: Cardiovascular complication will be avoided Outcome: Progressing   Problem: Activity: Goal: Risk for activity intolerance will decrease Outcome: Progressing   Problem: Nutrition: Goal: Adequate nutrition will be maintained Outcome: Progressing   Problem: Coping: Goal: Level of anxiety will decrease Outcome: Progressing   Problem: Elimination: Goal: Will not experience complications related to bowel motility Outcome: Progressing Goal: Will not experience complications related to urinary retention Outcome: Progressing   Problem: Pain Managment: Goal: General experience of comfort will improve Outcome: Progressing   Problem: Safety: Goal: Ability to remain free from injury will improve Outcome: Progressing   Problem: Skin Integrity: Goal: Risk for impaired skin integrity will decrease Outcome: Progressing   Problem: Cardiac: Goal: Ability to achieve and maintain adequate cardiopulmonary perfusion will improve Outcome: Progressing   Problem: Cardiovascular: Goal: Vascular access site(s) Level 0-1 will be maintained Outcome: Progressing

## 2020-12-12 LAB — BASIC METABOLIC PANEL
Anion gap: 7 (ref 5–15)
BUN: 25 mg/dL — ABNORMAL HIGH (ref 6–20)
CO2: 29 mmol/L (ref 22–32)
Calcium: 8.6 mg/dL — ABNORMAL LOW (ref 8.9–10.3)
Chloride: 104 mmol/L (ref 98–111)
Creatinine, Ser: 1.13 mg/dL — ABNORMAL HIGH (ref 0.44–1.00)
GFR, Estimated: 57 mL/min — ABNORMAL LOW (ref >60)
Glucose, Bld: 157 mg/dL — ABNORMAL HIGH (ref 70–99)
Potassium: 3.8 mmol/L (ref 3.5–5.1)
Sodium: 140 mmol/L (ref 135–145)

## 2020-12-12 MED ORDER — ATORVASTATIN CALCIUM 40 MG PO TABS: 40.0000 mg | ORAL_TABLET | Freq: Every day | ORAL | 0 refills | Status: DC

## 2020-12-12 MED ORDER — SPIRONOLACTONE 25 MG PO TABS: 25.0000 mg | ORAL_TABLET | Freq: Every day | ORAL | 0 refills | Status: DC

## 2020-12-12 MED ORDER — FUROSEMIDE 40 MG PO TABS: 40.0000 mg | ORAL_TABLET | Freq: Every day | ORAL | 0 refills | Status: DC

## 2020-12-12 MED ORDER — SACUBITRIL-VALSARTAN 49-51 MG PO TABS: 1.0000 | ORAL_TABLET | Freq: Two times a day (BID) | ORAL | 0 refills | Status: DC

## 2020-12-12 MED ORDER — CARVEDILOL 25 MG PO TABS: 25.0000 mg | ORAL_TABLET | Freq: Two times a day (BID) | ORAL | 0 refills | Status: DC

## 2020-12-12 MED ORDER — ISOSORBIDE MONONITRATE ER 60 MG PO TB24: 60.0000 mg | ORAL_TABLET | Freq: Every day | ORAL | 0 refills | Status: DC

## 2020-12-12 NOTE — Progress Notes (Signed)
Pt discharged to home. Dc instructions and education given with her AVS. Advised re: new meds and ff up appointments. No other questions at this point.

## 2020-12-12 NOTE — Discharge Summary (Signed)
THAI HEMRICK ZOX:[id number] DOB: [birth year] DOA: 12/07/2020  PCP: Patient, No Pcp Per (Inactive)  Admit date: 12/07/2020 Discharge date: 12/12/2020  Admitted From: Home Disposition: Home  Recommendations for Outpatient Follow-up:  Follow up with PCP in 1 week Please obtain BMP/CBC in one week Please follow up cardiology in 1 week     Discharge Condition:Stable CODE STATUS: Full Diet recommendation: Heart Healthy  Brief/Interim Summary: Per WJX:BJYNW D Prentiss is a 55 y.o. female with medical history significant for history of tobacco use, and no prior medical diagnosis and not taking any medications, presented to the emergency department via EMS for chief concerns of shortness of breath.Per note, EMS reports that patient had sudden onset of shortness of breath, and had denied any pain.  Patient was given Solu-Medrol 125 mg IV, albuterol, duo nebs on route. On admission it was thought her acute shortness of breath could be presumed from new heart failure exacerbation versus COPD exacerbation versus pneumonia.  However her procalcitonin was less than 0.10 and echo revealed EF of 35 to 40% and she also had elevated troponin.  She was then treated for acute systolic heart failure.  Antibiotics and IV steroids were discontinued for COPD exacerbation as she was not in COPD exacerbation.     # acute SHF- CHF appears to be new diagnosis  Echo EF 35 to 40% BNP elevated Cardiology was consulted Initially was volume overloaded and treated with IV Lasix Due to her multiple risk factors for CAD she underwent ischemic work-up, status post cardiac cath found with moderate RCA and left circumflex disease. Antibiotics and steroid therapy were discontinued on admission She was started on Entresto, Lasix, Coreg and Aldactone She is euvolemic and clinically improved and okay for discharge today.   Nonischemic cardiomyopathy EF 35 to 40% Etiology likely uncontrolled hypertension Please see above for  management    #Sepsis ruled out  Procalcitonin less than 0.1  No need for antibiotics  Blood cultures NTD. BCX negative Antibiotics were discontinued leukocytosis improved.  Likely reactive/stress     AKI- prerenal from diuresis   Now stable F/u as outpatient   #  Hypertensive urgency/emergency- Improved. 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       Discharge Diagnoses:  Active Problems:   Shortness of breath   Tobacco use   Acute CHF (congestive heart failure) (HCC)   Elevated troponin   Acute exacerbation of CHF (congestive heart failure) (HCC)   Hypertensive urgency    Discharge Instructions  Discharge Instructions     Call MD for:  severe uncontrolled pain   Complete by: As directed    Diet - low sodium heart healthy   Complete by: As directed    Discharge instructions   Complete by: As directed    Stop smoking Taking meds as directed Follow-up with cardiology in 1 week need blood work Need to follow-up with your primary care if he can in 1 week for blood work   Increase activity slowly   Complete by: As directed       Allergies as of 12/12/2020  No Known Allergies      Medication List     STOP taking these medications    diphenhydrAMINE 25 mg capsule Commonly known as: BENADRYL       TAKE these medications    acetaminophen 500 MG tablet Commonly known as: TYLENOL Take 500-1,000 mg by mouth every 6 (six) hours as needed for mild pain or moderate pain.   aspirin EC 81 MG tablet Take 81 mg by mouth daily.   atorvastatin 40 MG  tablet Commonly known as: LIPITOR Take 1 tablet (40 mg total) by mouth daily. Start taking on: December 13, 2020   carvedilol 25 MG tablet Commonly known as: COREG Take 1 tablet (25 mg total) by mouth 2 (two) times daily with a meal.   furosemide 40 MG tablet Commonly known as: LASIX Take 1 tablet (40 mg total) by mouth daily. Start taking on: December 13, 2020   isosorbide mononitrate 60 MG 24 hr tablet Commonly known as: IMDUR Take 1 tablet (60 mg total) by mouth daily. Start taking on: December 13, 2020   sacubitril-valsartan 49-51 MG Commonly known as: ENTRESTO Take 1 tablet by mouth 2 (two) times daily.   spironolactone 25 MG tablet Commonly known as: ALDACTONE Take 1 tablet (25 mg total) by mouth daily. Start taking on: December 13, 2020        Follow-up Information     Agbor-Etang, Arlys John, MD Follow up in 1 week(s).   Specialties: Cardiology, Radiology Why: needs blood work Contact information: [address] [place] [address] [phone]                No Known Allergies  Consultations: Cardiology   Procedures/Studies: CARDIAC CATHETERIZATION  Result Date: 12/10/2020   Prox RCA to Mid RCA lesion is 40% stenosed.   Mid RCA to Dist RCA lesion is 30% stenosed.   Prox Cx to Mid Cx lesion is 40% stenosed. 1.  Mild to moderate nonobstructive coronary artery disease 2.  Left ventricular angiography was not performed.  EF was moderately reduced by echo. 3.  High normal left ventricular end-diastolic pressure of 12 mmHg. Recommendations: Recommend medical therapy for nonischemic cardiomyopathy which is likely due to hypertensive heart disease.  I increase the dose of carvedilol.  Uptitrate Entresto as tolerated by blood pressure. No need for IV diuretics and we will plan on starting furosemide 40 mg by mouth tomorrow.   DG Chest Portable 1 View  Result Date: 12/07/2020 CLINICAL DATA:  sob EXAM: PORTABLE CHEST 1 VIEW COMPARISON:  None. FINDINGS: The  heart appears enlarged. No definite large pleural effusion. No pneumothorax. Increased interstitial opacities. No lobar consolidation. No acute osseous abnormality. IMPRESSION: The heart appears enlarged with increased interstitial opacities suggestive of pulmonary edema. Electronically Signed   By: Olive Bass M.D.   On: 12/07/2020 11:37   ECHOCARDIOGRAM COMPLETE  Result Date: 12/07/2020    ECHOCARDIOGRAM REPORT   Patient Name:   LOANY NEUROTH Date of Exam: 12/07/2020 Medical Rec #:  [id number]      Height:       61.0 in Accession #:    [id number]     Weight:       160.0 lb Date of Birth:  [birth year]      BSA:          1.718 m Patient Age:    54 years       BP:           169/97 mmHg Patient Gender: F  HR:           89 bpm. Exam Location:  [facility] Procedure: 2D Echo, Cardiac Doppler and Color Doppler Indications:     Dyspnea R06.00                  Elevated troponin  History:         Patient has no prior history of Echocardiogram examinations. No                  past medical history on file.  Sonographer:     Cristela Blue Referring Phys:  [id number] AMY N COX Diagnosing Phys: Yvonne Kendall MD  Sonographer Comments: Suboptimal apical window. IMPRESSIONS  1. Left ventricular ejection fraction, by estimation, is 35 to 40%. The left ventricle has moderately decreased function. The left ventricle demonstrates global hypokinesis. There is moderate left ventricular hypertrophy. Left ventricular diastolic parameters are consistent with Grade I diastolic dysfunction (impaired relaxation). Elevated left atrial pressure.  2. Right ventricular systolic function is normal. The right ventricular size is normal. Tricuspid regurgitation signal is inadequate for assessing PA pressure.  3. Left atrial size was mildly dilated.  4. Right atrial size was mildly dilated.  5. The mitral valve is abnormal. Mild mitral valve regurgitation. No evidence of mitral stenosis.  6. The aortic valve was not well visualized. Aortic  valve regurgitation is trivial. No aortic stenosis is present.  7. The inferior vena cava is normal in size with greater than 50% respiratory variability, suggesting right atrial pressure of 3 mmHg. FINDINGS  Left Ventricle: Left ventricular ejection fraction, by estimation, is 35 to 40%. The left ventricle has moderately decreased function. The left ventricle demonstrates global hypokinesis. The left ventricular internal cavity size was normal in size. There is moderate left ventricular hypertrophy. Left ventricular diastolic parameters are consistent with Grade I diastolic dysfunction (impaired relaxation). Elevated left atrial pressure. Right Ventricle: The right ventricular size is normal. No increase in right ventricular wall thickness. Right ventricular systolic function is normal. Tricuspid regurgitation signal is inadequate for assessing PA pressure. Left Atrium: Left atrial size was mildly dilated. Right Atrium: Right atrial size was mildly dilated. Pericardium: Trivial pericardial effusion is present. Mitral Valve: The mitral valve is abnormal. There is mild thickening of the mitral valve leaflet(s). Mild mitral valve regurgitation. No evidence of mitral valve stenosis. Tricuspid Valve: The tricuspid valve is not well visualized. Tricuspid valve regurgitation is not demonstrated. Aortic Valve: The aortic valve was not well visualized. Aortic valve regurgitation is trivial. No aortic stenosis is present. Aortic valve mean gradient measures 7.3 mmHg. Aortic valve peak gradient measures 12.2 mmHg. Aortic valve area, by VTI measures 1.65 cm. Pulmonic Valve: The pulmonic valve was not well visualized. Pulmonic valve regurgitation is trivial. No evidence of pulmonic stenosis. Aorta: The aortic root is normal in size and structure. Pulmonary Artery: The pulmonary artery is not well seen. Venous: The inferior vena cava is normal in size with greater than 50% respiratory variability, suggesting right atrial  pressure of 3 mmHg. IAS/Shunts: The interatrial septum was not well visualized.  LEFT VENTRICLE PLAX 2D LVIDd:         4.90 cm      Diastology LVIDs:         4.28 cm      LV e' medial:    3.59 cm/s LV PW:         1.51 cm      LV E/e' medial:  22.1 LV IVS:  1.32 cm      LV e' lateral:   4.68 cm/s LVOT diam:     2.00 cm      LV E/e' lateral: 16.9 LV SV:         47 LV SV Index:   27 LVOT Area:     3.14 cm  LV Volumes (MOD) LV vol d, MOD A2C: 113.0 ml LV vol d, MOD A4C: 94.0 ml LV vol s, MOD A2C: 68.6 ml LV vol s, MOD A4C: 61.1 ml LV SV MOD A2C:     44.4 ml LV SV MOD A4C:     94.0 ml LV SV MOD BP:      46.4 ml RIGHT VENTRICLE RV Basal diam:  3.20 cm RV S prime:     16.90 cm/s TAPSE (M-mode): 4.8 cm LEFT ATRIUM             Index       RIGHT ATRIUM           Index LA diam:        4.40 cm 2.56 cm/m  RA Area:     21.50 cm LA Vol (A2C):   64.3 ml 37.43 ml/m RA Volume:   68.30 ml  39.76 ml/m LA Vol (A4C):   60.4 ml 35.16 ml/m LA Biplane Vol: 66.1 ml 38.48 ml/m  AORTIC VALVE                    PULMONIC VALVE AV Area (Vmax):    1.42 cm     PV Vmax:        1.08 m/s AV Area (Vmean):   1.45 cm     PV Peak grad:   4.7 mmHg AV Area (VTI):     1.65 cm     RVOT Peak grad: 8 mmHg AV Vmax:           174.33 cm/s AV Vmean:          124.667 cm/s AV VTI:            0.285 m AV Peak Grad:      12.2 mmHg AV Mean Grad:      7.3 mmHg LVOT Vmax:         79.00 cm/s LVOT Vmean:        57.400 cm/s LVOT VTI:          0.150 m LVOT/AV VTI ratio: 0.53  AORTA Ao Root diam: 2.67 cm MITRAL VALVE MV Area (PHT): 5.02 cm     SHUNTS MV Decel Time: 151 msec     Systemic VTI:  0.15 m MV E velocity: 79.30 cm/s   Systemic Diam: 2.00 cm MV A velocity: 111.00 cm/s MV E/A ratio:  0.71 Cristal Deer End MD Electronically signed by Yvonne Kendall MD Signature Date/Time: 12/07/2020/5:27:30 PM    Final       Subjective: No sob, cp or HA  Discharge Exam: Vitals:   12/12/20 0918 12/12/20 1140  BP: (!) 145/75 132/64  Pulse:    Resp: 19 20  Temp:   99 F (37.2 C)  SpO2:  94%   Vitals:   12/12/20 0700 12/12/20 0750 12/12/20 0918 12/12/20 1140  BP:  (!) 161/86 (!) 145/75 132/64  Pulse:  71    Resp: (!) 24 (!) 23 19 20   Temp:  98.8 F (37.1 C)  99 F (37.2 C)  TempSrc:  Oral  Oral  SpO2:  95%  94%  Weight:      Height:  General: Pt is alert, awake, not in acute distress Cardiovascular: RRR, S1/S2 +, no rubs, no gallops Respiratory: CTA bilaterally, no wheezing, no rhonchi Abdominal: Soft, NT, ND, bowel sounds + Extremities: no edema, no cyanosis    The results of significant diagnostics from this hospitalization (including imaging, microbiology, ancillary and laboratory) are listed below for reference.     Microbiology: Recent Results (from the past 240 hour(s))  Resp Panel by RT-PCR (Flu A&B, Covid) Nasopharyngeal Swab     Status: None   Collection Time: 12/07/20 11:42 AM   Specimen: Nasopharyngeal Swab; Nasopharyngeal(NP) swabs in vial transport medium  Result Value Ref Range Status   SARS Coronavirus 2 by RT PCR NEGATIVE NEGATIVE Final    Comment: (NOTE) SARS-CoV-2 target nucleic acids are NOT DETECTED.  The SARS-CoV-2 RNA is generally detectable in upper respiratory specimens during the acute phase of infection. The lowest concentration of SARS-CoV-2 viral copies this assay can detect is 138 copies/mL. A negative result does not preclude SARS-Cov-2 infection and should not be used as the sole basis for treatment or other patient management decisions. A negative result may occur with  improper specimen collection/handling, submission of specimen other than nasopharyngeal swab, presence of viral mutation(s) within the areas targeted by this assay, and inadequate number of viral copies(<138 copies/mL). A negative result must be combined with clinical observations, patient history, and epidemiological information. The expected result is Negative.  Fact Sheet for Patients:   BloggerCourse.com  Fact Sheet for Healthcare Providers:  SeriousBroker.it  This test is no t yet approved or cleared by the [place] FDA and  has been authorized for detection and/or diagnosis of SARS-CoV-2 by FDA under an Emergency Use Authorization (EUA). This EUA will remain  in effect (meaning this test can be used) for the duration of the COVID-19 declaration under Section 564(b)(1) of the Act, 21 U.S.C.section 360bbb-3(b)(1), unless the authorization is terminated  or revoked sooner.       Influenza A by PCR NEGATIVE NEGATIVE Final   Influenza B by PCR NEGATIVE NEGATIVE Final    Comment: (NOTE) The Xpert Xpress SARS-CoV-2/FLU/RSV plus assay is intended as an aid in the diagnosis of influenza from Nasopharyngeal swab specimens and should not be used as a sole basis for treatment. Nasal washings and aspirates are unacceptable for Xpert Xpress SARS-CoV-2/FLU/RSV testing.  Fact Sheet for Patients: BloggerCourse.com  Fact Sheet for Healthcare Providers: SeriousBroker.it  This test is not yet approved or cleared by the [place] FDA and has been authorized for detection and/or diagnosis of SARS-CoV-2 by FDA under an Emergency Use Authorization (EUA). This EUA will remain in effect (meaning this test can be used) for the duration of the COVID-19 declaration under Section 564(b)(1) of the Act, 21 U.S.C. section 360bbb-3(b)(1), unless the authorization is terminated or revoked.  Performed at [facility], [address] [place], [place] [address]   CULTURE, BLOOD (ROUTINE X 2) w Reflex to ID Panel     Status: None (Preliminary result)   Collection Time: 12/07/20  2:33 PM   Specimen: BLOOD  Result Value Ref Range Status   Specimen Description BLOOD LEFT ANTECUBITAL  Final   Special Requests   Final    BOTTLES DRAWN AEROBIC AND ANAEROBIC Blood Culture  results may not be optimal due to an excessive volume of blood received in culture bottles   Culture   Final    NO GROWTH 4 DAYS Performed at [facility], [address], [place], [place] [address]  Report Status PENDING  Incomplete  CULTURE, BLOOD (ROUTINE X 2) w Reflex to ID Panel     Status: None (Preliminary result)   Collection Time: 12/07/20  2:37 PM   Specimen: BLOOD  Result Value Ref Range Status   Specimen Description BLOOD BLOOD RIGHT HAND  Final   Special Requests   Final    BOTTLES DRAWN AEROBIC AND ANAEROBIC Blood Culture adequate volume   Culture   Final    NO GROWTH 4 DAYS Performed at [facility], [address] [place], [place] [address]    Report Status PENDING  Incomplete  MRSA Next Gen by PCR, Nasal     Status: None   Collection Time: 12/07/20  4:20 PM   Specimen: Nasal Mucosa; Nasal Swab  Result Value Ref Range Status   MRSA by PCR Next Gen NOT DETECTED NOT DETECTED Final    Comment: (NOTE) The GeneXpert MRSA Assay (FDA approved for NASAL specimens only), is one component of a comprehensive MRSA colonization surveillance program. It is not intended to diagnose MRSA infection nor to guide or monitor treatment for MRSA infections. Test performance is not FDA approved in patients less than 57 years old. Performed at [facility], [address] [place], [place] [address]   Urine Culture     Status: None   Collection Time: 12/09/20  5:11 AM   Specimen: Urine, Clean Catch  Result Value Ref Range Status   Specimen Description   Final    URINE, CLEAN CATCH Performed at [facility], [address] [place], [place] [address]    Special Requests   Final    NONE Performed at [facility], [address] [place], [place] [address]    Culture   Final    NO GROWTH Performed at [facility], [address] [place], [place] [address]    Report Status 12/10/2020 FINAL  Final     Labs: BNP (last 3  results) Recent Labs    12/07/20 1111  BNP 952.8*   Basic Metabolic Panel: Recent Labs  Lab 12/08/20 0437 12/08/20 1836 12/09/20 0511 12/10/20 0608 12/11/20 0622 12/12/20 0621  NA 140  --  140 138 141 140  K 3.6  --  3.6 3.5 4.0 3.8  CL 104  --  101 99 104 104  CO2 28  --  29 32 28 29  GLUCOSE 205*  --  170* 152* 156* 157*  BUN 20  --  18 29* 32* 25*  CREATININE 1.02*  --  1.03* 1.39* 1.14* 1.13*  CALCIUM 8.6*  --  9.0 8.6* 8.6* 8.6*  MG  --  1.7  --   --  2.2  --    Liver Function Tests: Recent Labs  Lab 12/07/20 1111  AST 76*  ALT 59*  ALKPHOS 99  BILITOT 1.0  PROT 8.7*  ALBUMIN 4.0   No results for input(s): LIPASE, AMYLASE in the last 168 hours. No results for input(s): AMMONIA in the last 168 hours. CBC: Recent Labs  Lab 12/07/20 1111 12/08/20 0437  WBC 12.4* 10.8*  NEUTROABS 8.4*  --   HGB 17.3* 15.0  HCT 51.4* 43.4  MCV 98.5 94.6  PLT 232 245   Cardiac Enzymes: No results for input(s): CKTOTAL, CKMB, CKMBINDEX, TROPONINI in the last 168 hours. BNP: Invalid input(s): POCBNP CBG: Recent Labs  Lab 12/07/20 1359  GLUCAP 245*   D-Dimer No results for input(s): DDIMER in the last 72 hours. Hgb A1c  No results for input(s): HGBA1C in the last 72 hours. Lipid Profile Recent Labs    12/11/20 0622  CHOL 205*  HDL 34*  LDLCALC 138*  TRIG 164*  CHOLHDL 6.0   Thyroid function studies No results for input(s): TSH, T4TOTAL, T3FREE, THYROIDAB in the last 72 hours.  Invalid input(s): FREET3 Anemia work up No results for input(s): VITAMINB12, FOLATE, FERRITIN, TIBC, IRON, RETICCTPCT in the last 72 hours. Urinalysis    Component Value Date/Time   COLORURINE STRAW (A) 12/09/2020 0511   APPEARANCEUR CLEAR (A) 12/09/2020 0511   LABSPEC 1.006 12/09/2020 0511   PHURINE 7.0 12/09/2020 0511   GLUCOSEU 50 (A) 12/09/2020 0511   HGBUR SMALL (A) 12/09/2020 0511   BILIRUBINUR NEGATIVE 12/09/2020 0511   KETONESUR NEGATIVE 12/09/2020 0511   PROTEINUR 30  (A) 12/09/2020 0511   NITRITE NEGATIVE 12/09/2020 0511   LEUKOCYTESUR NEGATIVE 12/09/2020 0511   Sepsis Labs Invalid input(s): PROCALCITONIN,  WBC,  LACTICIDVEN Microbiology Recent Results (from the past 240 hour(s))  Resp Panel by RT-PCR (Flu A&B, Covid) Nasopharyngeal Swab     Status: None   Collection Time: 12/07/20 11:42 AM   Specimen: Nasopharyngeal Swab; Nasopharyngeal(NP) swabs in vial transport medium  Result Value Ref Range Status   SARS Coronavirus 2 by RT PCR NEGATIVE NEGATIVE Final    Comment: (NOTE) SARS-CoV-2 target nucleic acids are NOT DETECTED.  The SARS-CoV-2 RNA is generally detectable in upper respiratory specimens during the acute phase of infection. The lowest concentration of SARS-CoV-2 viral copies this assay can detect is 138 copies/mL. A negative result does not preclude SARS-Cov-2 infection and should not be used as the sole basis for treatment or other patient management decisions. A negative result may occur with  improper specimen collection/handling, submission of specimen other than nasopharyngeal swab, presence of viral mutation(s) within the areas targeted by this assay, and inadequate number of viral copies(<138 copies/mL). A negative result must be combined with clinical observations, patient history, and epidemiological information. The expected result is Negative.  Fact Sheet for Patients:  BloggerCourse.com  Fact Sheet for Healthcare Providers:  SeriousBroker.it  This test is no t yet approved or cleared by the [place] FDA and  has been authorized for detection and/or diagnosis of SARS-CoV-2 by FDA under an Emergency Use Authorization (EUA). This EUA will remain  in effect (meaning this test can be used) for the duration of the COVID-19 declaration under Section 564(b)(1) of the Act, 21 U.S.C.section 360bbb-3(b)(1), unless the authorization is terminated  or revoked sooner.        Influenza A by PCR NEGATIVE NEGATIVE Final   Influenza B by PCR NEGATIVE NEGATIVE Final    Comment: (NOTE) The Xpert Xpress SARS-CoV-2/FLU/RSV plus assay is intended as an aid in the diagnosis of influenza from Nasopharyngeal swab specimens and should not be used as a sole basis for treatment. Nasal washings and aspirates are unacceptable for Xpert Xpress SARS-CoV-2/FLU/RSV testing.  Fact Sheet for Patients: BloggerCourse.com  Fact Sheet for Healthcare Providers: SeriousBroker.it  This test is not yet approved or cleared by the [place] FDA and has been authorized for detection and/or diagnosis of SARS-CoV-2 by FDA under an Emergency Use Authorization (EUA). This EUA will remain in effect (meaning this test can be used) for the duration of the COVID-19 declaration under Section 564(b)(1) of the Act, 21 U.S.C. section 360bbb-3(b)(1), unless the authorization is terminated or revoked.  Performed at [facility], [address] [place], [place] [address]   CULTURE, BLOOD (ROUTINE  X 2) w Reflex to ID Panel     Status: None (Preliminary result)   Collection Time: 12/07/20  2:33 PM   Specimen: BLOOD  Result Value Ref Range Status   Specimen Description BLOOD LEFT ANTECUBITAL  Final   Special Requests   Final    BOTTLES DRAWN AEROBIC AND ANAEROBIC Blood Culture results may not be optimal due to an excessive volume of blood received in culture bottles   Culture   Final    NO GROWTH 4 DAYS Performed at [facility], [address] [place], [place] [address]    Report Status PENDING  Incomplete  CULTURE, BLOOD (ROUTINE X 2) w Reflex to ID Panel     Status: None (Preliminary result)   Collection Time: 12/07/20  2:37 PM   Specimen: BLOOD  Result Value Ref Range Status   Specimen Description BLOOD BLOOD RIGHT HAND  Final   Special Requests   Final    BOTTLES DRAWN AEROBIC AND ANAEROBIC Blood Culture adequate  volume   Culture   Final    NO GROWTH 4 DAYS Performed at [facility], [address] [place], [place] [address]    Report Status PENDING  Incomplete  MRSA Next Gen by PCR, Nasal     Status: None   Collection Time: 12/07/20  4:20 PM   Specimen: Nasal Mucosa; Nasal Swab  Result Value Ref Range Status   MRSA by PCR Next Gen NOT DETECTED NOT DETECTED Final    Comment: (NOTE) The GeneXpert MRSA Assay (FDA approved for NASAL specimens only), is one component of a comprehensive MRSA colonization surveillance program. It is not intended to diagnose MRSA infection nor to guide or monitor treatment for MRSA infections. Test performance is not FDA approved in patients less than 47 years old. Performed at [facility], [address], [place], [place] [address]   Urine Culture     Status: None   Collection Time: 12/09/20  5:11 AM   Specimen: Urine, Clean Catch  Result Value Ref Range Status   Specimen Description   Final    URINE, CLEAN CATCH Performed at [facility], [address] [place], [place] [address]    Special Requests   Final    NONE Performed at [facility], [address] [place], [place] [address]    Culture   Final    NO GROWTH Performed at [facility], [address] [place], [place] [address]    Report Status 12/10/2020 FINAL  Final     Time coordinating discharge: Over 30 minutes  SIGNED:   Lynn Ito, MD  [facility] 12/12/2020, 12:16 PM Pager   If 7PM-7AM, please contact night-coverage [email or web address] Password [email or web address]

## 2020-12-12 NOTE — Progress Notes (Signed)
Progress Note  Patient Name: Sharon Delgado Date of Encounter: 12/12/2020  [facility] Cardiologist: Agbor-Etang  Subjective   Denies chest pain or shortness of breath.  Feels well, no acute events overnight.  Inpatient Medications    Scheduled Meds:  aspirin EC  81 mg Oral Daily   atorvastatin  40 mg Oral Daily   carvedilol  25 mg Oral BID WC   Chlorhexidine Gluconate Cloth  6 each Topical Daily   enoxaparin (LOVENOX) injection  40 mg Subcutaneous QHS   furosemide  40 mg Oral Daily   ipratropium-albuterol  3 mL Nebulization Once   ipratropium-albuterol  3 mL Nebulization Once   isosorbide mononitrate  60 mg Oral Daily   sacubitril-valsartan  1 tablet Oral BID   sodium chloride flush  3 mL Intravenous Q12H   spironolactone  25 mg Oral Daily   Continuous Infusions:  sodium chloride     niCARDipine Stopped (12/09/20 0316)   PRN Meds: sodium chloride, acetaminophen, labetalol, ondansetron **OR** ondansetron (ZOFRAN) IV, sodium chloride flush   Vital Signs    Vitals:   12/12/20 0700 12/12/20 0750 12/12/20 0918 12/12/20 1140  BP:  (!) 161/86 (!) 145/75 132/64  Pulse:  71    Resp: (!) 24 (!) 23 19 20   Temp:  98.8 F (37.1 C)  99 F (37.2 C)  TempSrc:  Oral  Oral  SpO2:  95%  94%  Weight:      Height:        Intake/Output Summary (Last 24 hours) at 12/12/2020 1529 Last data filed at 12/12/2020 1131 Gross per 24 hour  Intake 483 ml  Output 150 ml  Net 333 ml   Last 3 Weights 12/11/2020 12/10/2020 12/09/2020  Weight (lbs) 163 lb 5.8 oz 160 lb 7.9 oz 160 lb 4.4 oz  Weight (kg) 74.1 kg 72.8 kg 72.7 kg      Telemetry    Sinus rhythm- Personally Reviewed  ECG    No new EKG obtained- Personally Reviewed  Physical Exam   GEN: No acute distress.   Neck: No JVD Cardiac: RRR, no murmurs, rubs, or gallops.  Respiratory: Clear to auscultation bilaterally. GI: Soft, nontender, non-distended  MS: No edema; No deformity. Neuro:  Nonfocal  Psych: Normal affect    Labs    High Sensitivity Troponin:   Recent Labs  Lab 12/07/20 1111 12/07/20 1437 12/07/20 1756 12/07/20 1947  TROPONINIHS 60* 105* 101* 96*      Chemistry Recent Labs  Lab 12/07/20 1111 12/08/20 0437 12/10/20 0608 12/11/20 0622 12/12/20 0621  NA 141   < > 138 141 140  K 4.1   < > 3.5 4.0 3.8  CL 106   < > 99 104 104  CO2 26   < > 32 28 29  GLUCOSE 180*   < > 152* 156* 157*  BUN 19   < > 29* 32* 25*  CREATININE 1.27*   < > 1.39* 1.14* 1.13*  CALCIUM 9.0   < > 8.6* 8.6* 8.6*  PROT 8.7*  --   --   --   --   ALBUMIN 4.0  --   --   --   --   AST 76*  --   --   --   --   ALT 59*  --   --   --   --   ALKPHOS 99  --   --   --   --   BILITOT 1.0  --   --   --   --  GFRNONAA 50*   < > 45* 56* 57*  ANIONGAP 9   < > 7 9 7    < > = values in this interval not displayed.     Hematology Recent Labs  Lab 12/07/20 1111 12/08/20 0437  WBC 12.4* 10.8*  RBC 5.22* 4.59  HGB 17.3* 15.0  HCT 51.4* 43.4  MCV 98.5 94.6  MCH 33.1 32.7  MCHC 33.7 34.6  RDW 13.3 13.1  PLT 232 245    BNP Recent Labs  Lab 12/07/20 1111  BNP 952.8*     DDimer No results for input(s): DDIMER in the last 168 hours.   Radiology    No results found.  Cardiac Studies   Echo EF 35 to 40%  Patient Profile     55 y.o. female with history of hypertension, 25+ years smoking history presenting with worsening shortness of breath diagnosed with nonischemic cardiomyopathy, nonobstructive CAD.  Assessment & Plan    1.  Nonischemic cardiomyopathy, EF 35 to 40% -Likely due to hypertension -Continue Coreg, Aldactone, Entresto at current doses. -Continue Lasix 40 mg daily, continue Imdur   2.  Nonobstructive CAD (moderate RCA and left circumflex disease) -aspirin, Lipitor.   3.  Hypertension -BP much improved. -Coreg, Entresto, Aldactone, Imdur as above.  4. Smoking -Cessation advised.   -Can be discharged on current cardiac medications.  Close follow-up as outpatient advised.  Smoking  cessation strongly advised.   Greater than 50% was spent in counseling and coordination of care with patient Total encounter time 35 minutes  Signed, 57, MD  12/12/2020, 3:29 PM

## 2020-12-14 ENCOUNTER — Telehealth: Payer: Self-pay | Admitting: Cardiology

## 2020-12-14 ENCOUNTER — Encounter: Payer: Self-pay | Admitting: Cardiovascular Disease

## 2020-12-14 LAB — CULTURE, BLOOD (ROUTINE X 2)
Culture: NO GROWTH
Culture: NO GROWTH
Special Requests: ADEQUATE

## 2020-12-14 NOTE — Telephone Encounter (Signed)
-----   Message from Lennon Alstrom, PA-C sent at 12/11/2020  3:21 PM EDT ----- Regarding: Hospital follow-up Hello,  This patient was admitted and seen at [facility] for hypertensive heart disease.  We are expecting discharge over the holiday weekend (9/3-9/5).  Can you please call and arrange / schedule for follow-up with Dr. Azucena Cecil or APP within the next couple of weeks?  Thank you! Signed, Lennon Alstrom, PA-C 12/11/2020, 3:21 PM Pager [phone]

## 2020-12-14 NOTE — Telephone Encounter (Signed)
LVM to schedule appt

## 2020-12-16 NOTE — Progress Notes (Signed)
Patient ID: Sharon Delgado, female    DOB: 05/20/1965, 55 y.o.   MRN: [id number]  HPI  Sharon Delgado is a 55 y/o female with a history of HTN, recent tobacco use and heart failure.   Echo report from 12/07/20 reviewed and showed an EF of 35-40% along with mild MR.   LHC done 12/10/20 and showed: Prox RCA to Mid RCA lesion is 40% stenosed.   Mid RCA to Dist RCA lesion is 30% stenosed.   Prox Cx to Mid Cx lesion is 40% stenosed.   1.  Mild to moderate nonobstructive coronary artery disease 2.  Left ventricular angiography was not performed.  EF was moderately reduced by echo. 3.  High normal left ventricular end-diastolic pressure of 12 mmHg.  Admitted 12/07/20 due to new onset HF. Cardiology consult obtained. Initially given IV lasix and then transitioned to oral medications. Elevated troponin thought to be due to demand ischemia. Cath completed. Discharged after 5 days.   She presents today with a chief complaint of a new patient visit. She says that she hasn't experienced any symptoms since her recent admission. She specifically denies any difficulty sleeping, dizziness, abdominal distention, palpitations, pedal edema, chest pain, shortness of breath, cough or fatigue.   Does not have scales.   Quit smoking during recent admission and hasn't smoked since she's been home.   Did not pick up the entresto because she said the pharmacy told her it would cost her $700 and she couldn't afford that.   Past Medical History:  Diagnosis Date   CHF (congestive heart failure) (HCC)    Hypertension     Past Surgical History:  Procedure Laterality Date   LEFT HEART CATH AND CORONARY ANGIOGRAPHY N/A 12/10/2020   Procedure: LEFT HEART CATH AND CORONARY ANGIOGRAPHY;  Surgeon: Iran Ouch, MD;  Location: [facility] INVASIVE CV LAB;  Service: Cardiovascular;  Laterality: N/A;   NO PAST SURGERIES     Family History  Problem Relation Age of Onset   Diabetes Mother    Hypertension Mother    COPD Father     Alcoholism Father    Social History   Tobacco Use   Smoking status: Former    Types: Cigarettes, Cigars    Quit date: 12/06/2020    Years since quitting: 0.0   Smokeless tobacco: Never  Substance Use Topics   Alcohol use: Not Currently   No Known Allergies Prior to Admission medications   Medication Sig Start Date End Date Taking? Authorizing Provider  acetaminophen (TYLENOL) 500 MG tablet Take 500-1,000 mg by mouth every 6 (six) hours as needed for mild pain or moderate pain.   Yes [provider]  aspirin EC 81 MG tablet Take 81 mg by mouth daily.   Yes [provider]  atorvastatin (LIPITOR) 40 MG tablet Take 1 tablet (40 mg total) by mouth daily. 12/13/20 01/12/21 Yes Lynn Ito, MD  carvedilol (COREG) 25 MG tablet Take 1 tablet (25 mg total) by mouth 2 (two) times daily with a meal. 12/12/20 01/11/21 Yes Amery, Freddi Che, MD  furosemide (LASIX) 40 MG tablet Take 1 tablet (40 mg total) by mouth daily. 12/13/20 01/12/21 Yes Lynn Ito, MD  isosorbide mononitrate (IMDUR) 60 MG 24 hr tablet Take 1 tablet (60 mg total) by mouth daily. 12/13/20 01/12/21 Yes Lynn Ito, MD  spironolactone (ALDACTONE) 25 MG tablet Take 1 tablet (25 mg total) by mouth daily. 12/13/20 01/12/21 Yes Lynn Ito, MD  sacubitril-valsartan (ENTRESTO) 49-51 MG Take 1 tablet by  mouth 2 (two) times daily. Patient not taking: Reported on 12/17/2020 12/12/20 01/11/21  Lynn Ito, MD    Review of Systems  Constitutional:  Negative for appetite change and fatigue.  HENT:  Negative for congestion, postnasal drip and sore throat.   Eyes: Negative.   Respiratory:  Negative for cough, chest tightness and shortness of breath.   Cardiovascular:  Negative for chest pain, palpitations and leg swelling.  Gastrointestinal:  Negative for abdominal distention and abdominal pain.  Endocrine: Negative.   Genitourinary: Negative.   Musculoskeletal:  Negative for back pain and neck pain.  Skin: Negative.    Allergic/Immunologic: Negative.   Neurological:  Negative for dizziness and light-headedness.  Hematological:  Negative for adenopathy. Does not bruise/bleed easily.  Psychiatric/Behavioral:  Negative for dysphoric mood and sleep disturbance (sleeping on 1 pillow). The patient is not nervous/anxious.    Vitals:   12/17/20 0819  BP: (!) 158/83  Pulse: 60  Resp: 20  SpO2: 97%  Weight: 164 lb 2 oz (74.4 kg)  Height: 5\' 1"  (1.549 m)   Wt Readings from Last 3 Encounters:  12/17/20 164 lb 2 oz (74.4 kg)  12/11/20 163 lb 5.8 oz (74.1 kg)  07/22/16 160 lb (72.6 kg)   Lab Results  Component Value Date   CREATININE 1.13 (H) 12/12/2020   CREATININE 1.14 (H) 12/11/2020   CREATININE 1.39 (H) 12/10/2020   Physical Exam Vitals and nursing note reviewed.  Constitutional:      Appearance: Normal appearance.  HENT:     Head: Normocephalic and atraumatic.  Cardiovascular:     Rate and Rhythm: Normal rate and regular rhythm.  Pulmonary:     Effort: Pulmonary effort is normal. No respiratory distress.     Breath sounds: No wheezing or rales.  Abdominal:     General: There is no distension.     Palpations: Abdomen is soft.     Tenderness: There is no abdominal tenderness.  Musculoskeletal:     Cervical back: Normal range of motion and neck supple.     Right lower leg: No edema.     Left lower leg: No edema.  Skin:    General: Skin is warm and dry.  Neurological:     General: No focal deficit present.     Mental Status: She is alert and oriented to person, place, and time.  Psychiatric:        Mood and Affect: Mood normal.        Behavior: Behavior normal.        Thought Content: Thought content normal.    Assessment & Plan:  1: Chronic heart failure with reduced ejection fraction- - NYHA class I - euvolemic today - scales given so that she can start weighing every morning; instructed to call for an overnight weight gain of > 2 pounds or a weekly weight gain of > 5 pounds -  not adding salt since her recent admission but prior, she did use salt; explained that it can take awhile for her taste buds to adapt - low sodium cookbook provided and discussed that she needed to keep her daily sodium intake to ~ 2000mg  / day - on GDMT of carvedilol and spironolactone - was prescribed entresto at discharge but she didn't pick it up because she was told it would cost her $700; 30 day voucher given to patient today and [employer] patient assistance forms filled out today for her - did not have [facility] cardiology f/u scheduled so this was scheduled for  12/24/20; will ask them to check BMP at that visit - drinks ~ 20-30 ounces of fluids daily because she was trying to not drink too much; explained that she could have ~ 60 ounces of fluid daily - works as a Education administrator; 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   Return in 1 month or sooner for any questions/problems before then.

## 2020-12-17 ENCOUNTER — Encounter: Payer: Self-pay | Admitting: Family

## 2020-12-17 ENCOUNTER — Ambulatory Visit: Payer: Medicaid Other | Attending: Family | Admitting: Family

## 2020-12-17 ENCOUNTER — Other Ambulatory Visit: Payer: Self-pay

## 2020-12-17 VITALS — BP 158/83 | HR 60 | Resp 20 | Ht 61.0 in | Wt 164.1 lb

## 2020-12-17 DIAGNOSIS — Z7982 Long term (current) use of aspirin: Secondary | ICD-10-CM | POA: Insufficient documentation

## 2020-12-17 DIAGNOSIS — Z87891 Personal history of nicotine dependence: Secondary | ICD-10-CM | POA: Insufficient documentation

## 2020-12-17 DIAGNOSIS — I10 Essential (primary) hypertension: Secondary | ICD-10-CM

## 2020-12-17 DIAGNOSIS — I5022 Chronic systolic (congestive) heart failure: Secondary | ICD-10-CM | POA: Insufficient documentation

## 2020-12-17 DIAGNOSIS — I2510 Atherosclerotic heart disease of native coronary artery without angina pectoris: Secondary | ICD-10-CM | POA: Insufficient documentation

## 2020-12-17 DIAGNOSIS — Z720 Tobacco use: Secondary | ICD-10-CM

## 2020-12-17 DIAGNOSIS — Z7901 Long term (current) use of anticoagulants: Secondary | ICD-10-CM | POA: Insufficient documentation

## 2020-12-17 DIAGNOSIS — Z79899 Other long term (current) drug therapy: Secondary | ICD-10-CM | POA: Insufficient documentation

## 2020-12-17 DIAGNOSIS — I110 Hypertensive heart disease with heart failure: Secondary | ICD-10-CM | POA: Insufficient documentation

## 2020-12-17 NOTE — Patient Instructions (Addendum)
Begin weighing daily and call for an overnight weight gain of > 2 pounds or a weekly weight gain of >5  pounds.    Drink around 60 ounces of fluid daily   Take entresto coupon to pharmacy and pick up prescription free of charge   Low-Sodium Eating Plan Sodium, which is an element that makes up salt, helps you maintain a healthy balance of fluids in your body. Too much sodium can increase your blood pressure and cause fluid and waste to be held in your body. Your health care provider or dietitian may recommend following this plan if you have high blood pressure (hypertension), kidney disease, liver disease, or heart failure. Eating less sodium can help lower your blood pressure, reduce swelling, and protect your heart, liver, and kidneys. What are tips for following this plan? Reading food labels The Nutrition Facts label lists the amount of sodium in one serving of the food. If you eat more than one serving, you must multiply the listed amount of sodium by the number of servings. Choose foods with less than 140 mg of sodium per serving. Avoid foods with 300 mg of sodium or more per serving. Shopping  Look for lower-sodium products, often labeled as "low-sodium" or "no salt added." Always check the sodium content, even if foods are labeled as "unsalted" or "no salt added." Buy fresh foods. Avoid canned foods and pre-made or frozen meals. Avoid canned, cured, or processed meats. Buy breads that have less than 80 mg of sodium per slice. Cooking  Eat more home-cooked food and less restaurant, buffet, and fast food. Avoid adding salt when cooking. Use salt-free seasonings or herbs instead of table salt or sea salt. Check with your health care provider or pharmacist before using salt substitutes. Cook with plant-based oils, such as canola, sunflower, or olive oil. Meal planning When eating at a restaurant, ask that your food be prepared with less salt or no salt, if possible. Avoid dishes  labeled as brined, pickled, cured, smoked, or made with soy sauce, miso, or teriyaki sauce. Avoid foods that contain MSG (monosodium glutamate). MSG is sometimes added to [place] food, bouillon, and some canned foods. Make meals that can be grilled, baked, poached, roasted, or steamed. These are generally made with less sodium. General information Most people on this plan should limit their sodium intake to 2,000 mg (milligrams) of sodium each day. What foods should I eat? Fruits Fresh, frozen, or canned fruit. Fruit juice. Vegetables Fresh or frozen vegetables. "No salt added" canned vegetables. "No salt added" tomato sauce and paste. Low-sodium or reduced-sodium tomato and vegetable juice. Grains Low-sodium cereals, including oats, puffed wheat and rice, and shredded wheat. Low-sodium crackers. Unsalted rice. Unsalted pasta. Low-sodium bread. Whole-grain breads and whole-grain pasta. Meats and other proteins Fresh or frozen (no salt added) meat, poultry, seafood, and fish. Low-sodium canned tuna and salmon. Unsalted nuts. Dried peas, beans, and lentils without added salt. Unsalted canned beans. Eggs. Unsalted nut butters. Dairy Milk. Soy milk. Cheese that is naturally low in sodium, such as ricotta cheese, fresh mozzarella, or Swiss cheese. Low-sodium or reduced-sodium cheese. Cream cheese. Yogurt. Seasonings and condiments Fresh and dried herbs and spices. Salt-free seasonings. Low-sodium mustard and ketchup. Sodium-free salad dressing. Sodium-free light mayonnaise. Fresh or refrigerated horseradish. Lemon juice. Vinegar. Other foods Homemade, reduced-sodium, or low-sodium soups. Unsalted popcorn and pretzels. Low-salt or salt-free chips. The items listed above may not be a complete list of foods and beverages you can eat. Contact a dietitian for more  information. What foods should I avoid? Vegetables Sauerkraut, pickled vegetables, and relishes. Olives. [place] fries. Onion rings. Regular  canned vegetables (not low-sodium or reduced-sodium). Regular canned tomato sauce and paste (not low-sodium or reduced-sodium). Regular tomato and vegetable juice (not low-sodium or reduced-sodium). Frozen vegetables in sauces. Grains Instant hot cereals. Bread stuffing, pancake, and biscuit mixes. Croutons. Seasoned rice or pasta mixes. Noodle soup cups. Boxed or frozen macaroni and cheese. Regular salted crackers. Self-rising flour. Meats and other proteins Meat or fish that is salted, canned, smoked, spiced, or pickled. Precooked or cured meat, such as sausages or meat loaves. Sharon Delgado. Ham. Pepperoni. Hot dogs. Corned beef. Chipped beef. Salt pork. Jerky. Pickled herring. Anchovies and sardines. Regular canned tuna. Salted nuts. Dairy Processed cheese and cheese spreads. Hard cheeses. Cheese curds. Blue cheese. Feta cheese. String cheese. Regular cottage cheese. Buttermilk. Canned milk. Fats and oils Salted butter. Regular margarine. Ghee. Bacon fat. Seasonings and condiments Onion salt, garlic salt, seasoned salt, table salt, and sea salt. Canned and packaged gravies. Worcestershire sauce. Tartar sauce. Barbecue sauce. Teriyaki sauce. Soy sauce, including reduced-sodium. Steak sauce. Fish sauce. Oyster sauce. Cocktail sauce. Horseradish that you find on the shelf. Regular ketchup and mustard. Meat flavorings and tenderizers. Bouillon cubes. Hot sauce. Pre-made or packaged marinades. Pre-made or packaged taco seasonings. Relishes. Regular salad dressings. Salsa. Other foods Salted popcorn and pretzels. Corn chips and puffs. Potato and tortilla chips. Canned or dried soups. Pizza. Frozen entrees and pot pies. The items listed above may not be a complete list of foods and beverages you should avoid. Contact a dietitian for more information. Summary Eating less sodium can help lower your blood pressure, reduce swelling, and protect your heart, liver, and kidneys. Most people on this plan should limit  their sodium intake to 1,500-2,000 mg (milligrams) of sodium each day. Canned, boxed, and frozen foods are high in sodium. Restaurant foods, fast foods, and pizza are also very high in sodium. You also get sodium by adding salt to food. Try to cook at home, eat more fresh fruits and vegetables, and eat less fast food and canned, processed, or prepared foods. This information is not intended to replace advice given to you by your health care provider. Make sure you discuss any questions you have with your health care provider. Document Revised: 05/02/2019 Document Reviewed: 02/26/2019 Elsevier Patient Education  2022 [employer].

## 2020-12-24 ENCOUNTER — Ambulatory Visit (INDEPENDENT_AMBULATORY_CARE_PROVIDER_SITE_OTHER): Payer: Self-pay | Admitting: Cardiology

## 2020-12-24 ENCOUNTER — Encounter: Payer: Self-pay | Admitting: Cardiology

## 2020-12-24 ENCOUNTER — Other Ambulatory Visit: Payer: Self-pay

## 2020-12-24 VITALS — BP 130/66 | HR 53 | Ht 61.0 in | Wt 169.0 lb

## 2020-12-24 DIAGNOSIS — E7800 Pure hypercholesterolemia, unspecified: Secondary | ICD-10-CM

## 2020-12-24 DIAGNOSIS — I2510 Atherosclerotic heart disease of native coronary artery without angina pectoris: Secondary | ICD-10-CM

## 2020-12-24 DIAGNOSIS — I10 Essential (primary) hypertension: Secondary | ICD-10-CM

## 2020-12-24 DIAGNOSIS — I428 Other cardiomyopathies: Secondary | ICD-10-CM

## 2020-12-24 MED ORDER — ENTRESTO 97-103 MG PO TABS: 1.0000 | ORAL_TABLET | Freq: Two times a day (BID) | ORAL | Status: DC

## 2020-12-24 MED ORDER — ISOSORBIDE MONONITRATE ER 30 MG PO TB24: 30.0000 mg | ORAL_TABLET | Freq: Every day | ORAL | 5 refills | Status: DC

## 2020-12-24 NOTE — Progress Notes (Signed)
Cardiology Office Note:    Date:  12/24/2020   ID:  Sharon Delgado, DOB Nov 10, 1965, MRN [id number]  PCP:  Patient, No Pcp Per (Inactive)   [facility] Providers Cardiologist:  Debbe Odea, MD     Referring MD: No ref. provider found   Chief Complaint  Patient presents with   Other    Hospital follow up -- Meds reviewed verbally with patient.     History of Present Illness:    Sharon Delgado is a 55 y.o. female with a hx of CAD (lhc 12/2020 mod RCA, Lcx), NICM EF 35-40%, hypertension, former smoker x25+ years who presents for follow-up.  Patient seen in the hospital due to worsening shortness of breath.  Diagnosed with cardiomyopathy, left heart cath revealed nonobstructive CAD.  Started on GDMT including Coreg, Aldactone, Entresto, Lasix.  She takes medications as prescribed, shortness of breath is improved.  Has not smoked for the past 2 weeks, does not plan on smoking in the future.  Blood pressures have been controlled with systolics usually in the 130s.  States her breathing is better, denies edema.  Prior notes Echo 11/2020 EF 35 to 40%. Left heart cath 12/2020 nonobstructive CAD, proximal to mid RCA 40%, left circumflex 40%.  Past Medical History:  Diagnosis Date   CHF (congestive heart failure) (HCC)    Hypertension     Past Surgical History:  Procedure Laterality Date   LEFT HEART CATH AND CORONARY ANGIOGRAPHY N/A 12/10/2020   Procedure: LEFT HEART CATH AND CORONARY ANGIOGRAPHY;  Surgeon: Iran Ouch, MD;  Location: [facility] INVASIVE CV LAB;  Service: Cardiovascular;  Laterality: N/A;   NO PAST SURGERIES      Current Medications: Current Meds  Medication Sig   acetaminophen (TYLENOL) 500 MG tablet Take 500-1,000 mg by mouth every 6 (six) hours as needed for mild pain or moderate pain.   aspirin EC 81 MG tablet Take 81 mg by mouth daily.   atorvastatin (LIPITOR) 40 MG tablet Take 1 tablet (40 mg total) by mouth daily.   carvedilol (COREG) 25 MG tablet Take  1 tablet (25 mg total) by mouth 2 (two) times daily with a meal.   furosemide (LASIX) 40 MG tablet Take 1 tablet (40 mg total) by mouth daily.   sacubitril-valsartan (ENTRESTO) 97-103 MG Take 1 tablet by mouth 2 (two) times daily.   spironolactone (ALDACTONE) 25 MG tablet Take 1 tablet (25 mg total) by mouth daily.   [DISCONTINUED] isosorbide mononitrate (IMDUR) 60 MG 24 hr tablet Take 1 tablet (60 mg total) by mouth daily.   [DISCONTINUED] sacubitril-valsartan (ENTRESTO) 49-51 MG Take 1 tablet by mouth 2 (two) times daily.     Allergies:   Patient has no known allergies.   Social History   Socioeconomic History   Marital status: Single    Spouse name: Not on file   Number of children: Not on file   Years of education: Not on file   Highest education level: Not on file  Occupational History   Not on file  Tobacco Use   Smoking status: Former    Types: Cigarettes, Cigars    Quit date: 12/06/2020    Years since quitting: 0.0   Smokeless tobacco: Never  Vaping Use   Vaping Use: Never used  Substance and Sexual Activity   Alcohol use: Not Currently   Drug use: No   Sexual activity: Not on file  Other Topics Concern   Not on file  Social History Narrative  Not on file   Social Determinants of Health   Financial Resource Strain: Not on file  Food Insecurity: Not on file  Transportation Needs: Not on file  Physical Activity: Not on file  Stress: Not on file  Social Connections: Not on file     Family History: The patient's family history includes Alcoholism in her father; COPD in her father; Diabetes in her mother; Hypertension in her mother.  ROS:   Please see the history of present illness.     All other systems reviewed and are negative.  EKGs/Labs/Other Studies Reviewed:    The following studies were reviewed today:   EKG:  EKG is  ordered today.  The ekg ordered today demonstrates sinus bradycardia, heart rate 53  Recent Labs: 12/07/2020: ALT 59; B  Natriuretic Peptide 952.8 12/08/2020: Hemoglobin 15.0; Platelets 245 12/11/2020: Magnesium 2.2 12/12/2020: BUN 25; Creatinine, Ser 1.13; Potassium 3.8; Sodium 140  Recent Lipid Panel    Component Value Date/Time   CHOL 205 (H) 12/11/2020 0622   CHOL 157 07/11/2013 0613   TRIG 164 (H) 12/11/2020 0622   TRIG 131 07/11/2013 0613   HDL 34 (L) 12/11/2020 0622   HDL 30 (L) 07/11/2013 0613   CHOLHDL 6.0 12/11/2020 0622   VLDL 33 12/11/2020 0622   VLDL 26 07/11/2013 0613   LDLCALC 138 (H) 12/11/2020 0622   LDLCALC 101 (H) 07/11/2013 0613     Risk Assessment/Calculations:          Physical Exam:    VS:  BP 130/66 (BP Location: Left Arm, Patient Position: Sitting, Cuff Size: Normal)   Pulse (!) 53   Ht 5\' 1"  (1.549 m)   Wt 169 lb (76.7 kg)   SpO2 95%   BMI 31.93 kg/m     Wt Readings from Last 3 Encounters:  12/24/20 169 lb (76.7 kg)  12/17/20 164 lb 2 oz (74.4 kg)  12/11/20 163 lb 5.8 oz (74.1 kg)     GEN:  Well nourished, well developed in no acute distress HEENT: Normal NECK: No JVD; No carotid bruits LYMPHATICS: No lymphadenopathy CARDIAC: RRR, no murmurs, rubs, gallops RESPIRATORY:  Clear to auscultation without rales, wheezing or rhonchi  ABDOMEN: Soft, non-tender, non-distended MUSCULOSKELETAL:  No edema; No deformity  SKIN: Warm and dry NEUROLOGIC:  Alert and oriented x 3 PSYCHIATRIC:  Normal affect   ASSESSMENT:    1. NICM (nonischemic cardiomyopathy) (HCC)   2. Coronary artery disease involving native coronary artery of native heart without angina pectoris   3. Primary hypertension   4. Pure hypercholesterolemia    PLAN:    In order of problems listed above:  Nonischemic cardiomyopathy, EF 35 to 40% describes NYHA class II-III symptoms.  She is euvolemic.  Increase Entresto to 97-103.  If cost is an issue with Entresto, plan to use ARB.  Continue Coreg 25 twice daily, Aldactone. Nonobstructive CAD moderate RCA and left circumflex disease.  Denies chest  pain.  Continue aspirin, Lipitor, reduce Imdur to 30 mg. Hypertension, BP controlled.  Coreg, Entresto, Aldactone as above. Hyperlipidemia, statin 40 mg daily.  Follow-up in 6 weeks for medication adjustments, BP.     Medication Adjustments/Labs and Tests Ordered: Current medicines are reviewed at length with the patient today.  Concerns regarding medicines are outlined above.  Orders Placed This Encounter  Procedures   EKG 12-Lead    Meds ordered this encounter  Medications   sacubitril-valsartan (ENTRESTO) 97-103 MG    Sig: Take 1 tablet by mouth 2 (two) times  daily.    Dispense:  60 tablet   isosorbide mononitrate (IMDUR) 30 MG 24 hr tablet    Sig: Take 1 tablet (30 mg total) by mouth daily.    Dispense:  30 tablet    Refill:  5     Patient Instructions  Medication Instructions:   Your physician has recommended you make the following change in your medication:   IN 2 WEEKS-    DECREASE your IMDUR (Isosorbide) to 30 MG once a day.  2.    INCREASE your Entresto to 97-103 MG twice a day.  *If you need a refill on your cardiac medications before your next appointment, please call your pharmacy*   Lab Work: None Ordered If you have labs (blood work) drawn today and your tests are completely normal, you will receive your results only by: MyChart Message (if you have MyChart) OR A paper copy in the mail If you have any lab test that is abnormal or we need to change your treatment, we will call you to review the results.   Testing/Procedures: None ordered   Follow-Up: At [facility], you and your health needs are our priority.  As part of our continuing mission to provide you with exceptional heart care, we have created designated Provider Care Teams.  These Care Teams include your primary Cardiologist (physician) and Advanced Practice Providers (APPs -  Physician Assistants and Nurse Practitioners) who all work together to provide you with the care you need, when  you need it.  We recommend signing up for the patient portal called "MyChart".  Sign up information is provided on this After Visit Summary.  MyChart is used to connect with patients for Virtual Visits (Telemedicine).  Patients are able to view lab/test results, encounter notes, upcoming appointments, etc.  Non-urgent messages can be sent to your provider as well.   To learn more about what you can do with MyChart, go to [email or web address].    Your next appointment:   6 week(s)  The format for your next appointment:   In Person  Provider:   Debbe Odea, MD  ONLY   Other Instructions    Signed, Debbe Odea, MD  12/24/2020 12:56 PM    [facility]

## 2020-12-24 NOTE — Patient Instructions (Signed)
Medication Instructions:   Your physician has recommended you make the following change in your medication:   IN 2 WEEKS-    DECREASE your IMDUR (Isosorbide) to 30 MG once a day.  2.    INCREASE your Entresto to 97-103 MG twice a day.  *If you need a refill on your cardiac medications before your next appointment, please call your pharmacy*   Lab Work: None Ordered If you have labs (blood work) drawn today and your tests are completely normal, you will receive your results only by: MyChart Message (if you have MyChart) OR A paper copy in the mail If you have any lab test that is abnormal or we need to change your treatment, we will call you to review the results.   Testing/Procedures: None ordered   Follow-Up: At [facility], you and your health needs are our priority.  As part of our continuing mission to provide you with exceptional heart care, we have created designated Provider Care Teams.  These Care Teams include your primary Cardiologist (physician) and Advanced Practice Providers (APPs -  Physician Assistants and Nurse Practitioners) who all work together to provide you with the care you need, when you need it.  We recommend signing up for the patient portal called "MyChart".  Sign up information is provided on this After Visit Summary.  MyChart is used to connect with patients for Virtual Visits (Telemedicine).  Patients are able to view lab/test results, encounter notes, upcoming appointments, etc.  Non-urgent messages can be sent to your provider as well.   To learn more about what you can do with MyChart, go to [email or web address].    Your next appointment:   6 week(s)  The format for your next appointment:   In Person  Provider:   Debbe Odea, MD  ONLY   Other Instructions

## 2020-12-29 ENCOUNTER — Telehealth: Payer: Self-pay | Admitting: Family

## 2020-12-29 NOTE — Telephone Encounter (Signed)
Called and notified patient she was approved for patient assistance for entresto thru [employer] till 12/2021 and how to call and set up mail order for medications. Cardiology raised the dosage to 97-103 and we are faxing an updated perscription to [employer].    Sharon Delgado, NT

## 2020-12-31 NOTE — Telephone Encounter (Signed)
Attempted to schedule no ans no vm  

## 2021-01-14 NOTE — Progress Notes (Signed)
Patient ID: Sharon Delgado, female    DOB: 1965-05-13, 55 y.o.   MRN: [id number]  HPI  Sharon Delgado is a 55 y/o female with a history of HTN, recent tobacco use and heart failure.   Echo report from 12/07/20 reviewed and showed an EF of 35-40% along with mild MR.   LHC done 12/10/20 and showed: Prox RCA to Mid RCA lesion is 40% stenosed.   Mid RCA to Dist RCA lesion is 30% stenosed.   Prox Cx to Mid Cx lesion is 40% stenosed.   1.  Mild to moderate nonobstructive coronary artery disease 2.  Left ventricular angiography was not performed.  EF was moderately reduced by echo. 3.  High normal left ventricular end-diastolic pressure of 12 mmHg.  Admitted 12/07/20 due to new onset HF. Cardiology consult obtained. Initially given IV lasix and then transitioned to oral medications. Elevated troponin thought to be due to demand ischemia. Cath completed. Discharged after 5 days.   She presents today for a follow-up visit with a chief complaint of minimal fatigue upon moderate exertion. 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 She has noticed a gradual weight gain along with this. He denies any difficulty sleeping, dizziness, abdominal distention, palpitations, pedal edema, chest pain, shortness of breath or cough.   Hasn't taken any of her medications yet today and needs new prescriptions for everything except entresto as she gets that through the mail through [employer] patient assistance.   Past Medical History:  Diagnosis Date   CHF (congestive heart failure) (HCC)    Hypertension     Past Surgical History:  Procedure Laterality Date   LEFT HEART CATH AND CORONARY ANGIOGRAPHY N/A 12/10/2020   Procedure: LEFT HEART CATH AND CORONARY ANGIOGRAPHY;  Surgeon: Iran Ouch, MD;  Location: [facility] INVASIVE CV LAB;  Service: Cardiovascular;  Laterality: N/A;   NO PAST SURGERIES     Family History  Problem Relation Age of Onset    Diabetes Mother    Hypertension Mother    COPD Father    Alcoholism Father    Social History   Tobacco Use   Smoking status: Former    Types: Cigarettes, Cigars    Quit date: 12/06/2020    Years since quitting: 0.1   Smokeless tobacco: Never  Substance Use Topics   Alcohol use: Not Currently   No Known Allergies  Prior to Admission medications   Medication Sig Start Date End Date Taking? Authorizing Provider  acetaminophen (TYLENOL) 500 MG tablet Take 500-1,000 mg by mouth every 6 (six) hours as needed for mild pain or moderate pain.   Yes [provider]  aspirin EC 81 MG tablet Take 81 mg by mouth daily.   Yes [provider]  sacubitril-valsartan (ENTRESTO) 97-103 MG Take 1 tablet by mouth 2 (two) times daily. 12/24/20  Yes Agbor-Etang, Arlys John, MD  atorvastatin (LIPITOR) 40 MG tablet Take 1 tablet (40 mg total) by mouth daily. 01/17/21 02/16/21  Delma Freeze, FNP  carvedilol (COREG) 25 MG tablet Take 1 tablet (25 mg total) by mouth 2 (two) times daily with a meal. 01/17/21 02/16/21  Delma Freeze, FNP  furosemide (LASIX) 40 MG tablet Take 1 tablet (40 mg total) by mouth daily. 01/17/21 02/16/21  Delma Freeze, FNP  isosorbide mononitrate (IMDUR) 30 MG 24 hr tablet Take 1 tablet (30 mg total) by mouth daily. 01/17/21 02/16/21  Delma Freeze, FNP  spironolactone (ALDACTONE) 25 MG tablet Take 1 tablet (25 mg total) by mouth daily. 01/17/21 02/16/21  Delma Freeze, FNP    Review of Systems  Constitutional:  Positive for fatigue. Negative for appetite change.  HENT:  Negative for congestion, postnasal drip and sore throat.   Eyes: Negative.   Respiratory:  Negative for cough, chest tightness and shortness of breath.   Cardiovascular:  Negative for chest pain, palpitations and leg swelling.  Gastrointestinal:  Negative for abdominal distention and abdominal pain.  Endocrine: Negative.   Genitourinary: Negative.   Musculoskeletal:  Negative for back pain and  neck pain.  Skin: Negative.   Allergic/Immunologic: Negative.   Neurological:  Negative for dizziness and light-headedness.  Hematological:  Negative for adenopathy. Does not bruise/bleed easily.  Psychiatric/Behavioral:  Negative for dysphoric mood and sleep disturbance (sleeping on 1 pillow). The patient is not nervous/anxious.    Vitals:   01/17/21 0859  BP: (!) 180/90  Pulse: (!) 57  Resp: 18  SpO2: 99%  Weight: 174 lb 4 oz (79 kg)  Height: 5\' 1"  (1.549 m)   Wt Readings from Last 3 Encounters:  01/17/21 174 lb 4 oz (79 kg)  12/24/20 169 lb (76.7 kg)  12/17/20 164 lb 2 oz (74.4 kg)   Lab Results  Component Value Date   CREATININE 1.13 (H) 12/12/2020   CREATININE 1.14 (H) 12/11/2020   CREATININE 1.39 (H) 12/10/2020    Physical Exam Vitals and nursing note reviewed.  Constitutional:      Appearance: Normal appearance.  HENT:     Head: Normocephalic and atraumatic.  Cardiovascular:     Rate and Rhythm: Normal rate and regular rhythm.  Pulmonary:     Effort: Pulmonary effort is normal. No respiratory distress.     Breath sounds: No wheezing or rales.  Abdominal:     General: There is no distension.     Palpations: Abdomen is soft.     Tenderness: There is no abdominal tenderness.  Musculoskeletal:     Cervical back: Normal range of motion and neck supple.     Right lower leg: No edema.     Left lower leg: No edema.  Skin:    General: Skin is warm and dry.  Neurological:     General: No focal deficit present.     Mental Status: She is alert and oriented to person, place, and time.  Psychiatric:        Mood and Affect: Mood normal.        Behavior: Behavior normal.        Thought Content: Thought content normal.    Assessment & Plan:  1: Chronic heart failure with reduced ejection fraction- - NYHA class II - euvolemic today - weighing daily; reminded to call for an overnight weight gain of > 2 pounds or a weekly weight gain of > 5 pounds - weight up 10  pounds since last here 1 month ago; admits that she's not as active since she is no longer working as a 02/09/2021 - not adding salt and is looking at food labels; reminded to keep her daily sodium intake to ~ 2000mg  / day - on GDMT of carvedilol, entresto and spironolactone - consider adding SLGT2 but would need patient assistance  - saw cardiology (Agbor-Etang) 12/24/20 with titration of entresto to 97/103mg  BID; returns later this month - has gotten approval through patient assitance to get her entresto at no cost - BNP 12/07/20 was 952.8  2: HTN-  -  BP elevated but she hasn't taken any of her medications yet today - checks her BP at home and log shows readings of 140-150's/70's - needs to get established with PCP; [facility] good option and they a pharmacy on site - BMP 12/12/20 reviewed and showed sodium 140, potassium 3.8, creatinine 1.13 and GFR 57 - will check BMP today since she's on spironolactone and entresto  3: Tobacco use- - hasn't smoked in ~ 1 month - congratulated on that and continued cessation was encouraged   Medication bottles reviewed.   Return in 2 months or sooner for any questions/problems before then.

## 2021-01-17 ENCOUNTER — Other Ambulatory Visit
Admission: RE | Admit: 2021-01-17 | Discharge: 2021-01-17 | Disposition: A | Discharge status: Home / Self Care | Payer: Medicaid Other | Source: Ambulatory Visit | Attending: Family | Admitting: Family

## 2021-01-17 ENCOUNTER — Encounter: Payer: Self-pay | Admitting: Family

## 2021-01-17 ENCOUNTER — Other Ambulatory Visit: Payer: Self-pay

## 2021-01-17 ENCOUNTER — Ambulatory Visit: Payer: Medicaid Other | Attending: Family | Admitting: Family

## 2021-01-17 VITALS — BP 180/90 | HR 57 | Resp 18 | Ht 61.0 in | Wt 174.2 lb

## 2021-01-17 DIAGNOSIS — I2510 Atherosclerotic heart disease of native coronary artery without angina pectoris: Secondary | ICD-10-CM | POA: Insufficient documentation

## 2021-01-17 DIAGNOSIS — Z79899 Other long term (current) drug therapy: Secondary | ICD-10-CM | POA: Insufficient documentation

## 2021-01-17 DIAGNOSIS — Z87891 Personal history of nicotine dependence: Secondary | ICD-10-CM | POA: Insufficient documentation

## 2021-01-17 DIAGNOSIS — I5022 Chronic systolic (congestive) heart failure: Secondary | ICD-10-CM | POA: Insufficient documentation

## 2021-01-17 DIAGNOSIS — Z720 Tobacco use: Secondary | ICD-10-CM

## 2021-01-17 DIAGNOSIS — I10 Essential (primary) hypertension: Secondary | ICD-10-CM

## 2021-01-17 DIAGNOSIS — I110 Hypertensive heart disease with heart failure: Secondary | ICD-10-CM | POA: Insufficient documentation

## 2021-01-17 DIAGNOSIS — Z8249 Family history of ischemic heart disease and other diseases of the circulatory system: Secondary | ICD-10-CM | POA: Insufficient documentation

## 2021-01-17 DIAGNOSIS — I428 Other cardiomyopathies: Secondary | ICD-10-CM | POA: Insufficient documentation

## 2021-01-17 DIAGNOSIS — Z7982 Long term (current) use of aspirin: Secondary | ICD-10-CM | POA: Insufficient documentation

## 2021-01-17 LAB — BASIC METABOLIC PANEL
Anion gap: 7 (ref 5–15)
BUN: 25 mg/dL — ABNORMAL HIGH (ref 6–20)
CO2: 27 mmol/L (ref 22–32)
Calcium: 9 mg/dL (ref 8.9–10.3)
Chloride: 105 mmol/L (ref 98–111)
Creatinine, Ser: 1.27 mg/dL — ABNORMAL HIGH (ref 0.44–1.00)
GFR, Estimated: 49 mL/min — ABNORMAL LOW (ref >60)
Glucose, Bld: 92 mg/dL (ref 70–99)
Potassium: 4.2 mmol/L (ref 3.5–5.1)
Sodium: 139 mmol/L (ref 135–145)

## 2021-01-17 MED ORDER — CARVEDILOL 25 MG PO TABS: 25.0000 mg | ORAL_TABLET | Freq: Two times a day (BID) | ORAL | 5 refills | Status: DC

## 2021-01-17 MED ORDER — ATORVASTATIN CALCIUM 40 MG PO TABS: 40.0000 mg | ORAL_TABLET | Freq: Every day | ORAL | 5 refills | Status: DC

## 2021-01-17 MED ORDER — SPIRONOLACTONE 25 MG PO TABS: 25.0000 mg | ORAL_TABLET | Freq: Every day | ORAL | 5 refills | Status: DC

## 2021-01-17 MED ORDER — ISOSORBIDE MONONITRATE ER 30 MG PO TB24: 30.0000 mg | ORAL_TABLET | Freq: Every day | ORAL | 5 refills | Status: DC

## 2021-01-17 MED ORDER — FUROSEMIDE 40 MG PO TABS: 40.0000 mg | ORAL_TABLET | Freq: Every day | ORAL | 5 refills | Status: DC

## 2021-01-17 NOTE — Patient Instructions (Signed)
Continue weighing daily and call for an overnight weight gain of > 2 pounds or a weekly weight gain of >5 pounds. 

## 2021-02-04 ENCOUNTER — Ambulatory Visit (INDEPENDENT_AMBULATORY_CARE_PROVIDER_SITE_OTHER): Payer: Self-pay | Admitting: Cardiology

## 2021-02-04 ENCOUNTER — Other Ambulatory Visit: Payer: Self-pay

## 2021-02-04 ENCOUNTER — Encounter: Payer: Self-pay | Admitting: Cardiology

## 2021-02-04 VITALS — BP 140/68 | HR 53 | Ht 61.0 in | Wt 174.0 lb

## 2021-02-04 DIAGNOSIS — E7800 Pure hypercholesterolemia, unspecified: Secondary | ICD-10-CM

## 2021-02-04 DIAGNOSIS — I428 Other cardiomyopathies: Secondary | ICD-10-CM

## 2021-02-04 DIAGNOSIS — I10 Essential (primary) hypertension: Secondary | ICD-10-CM

## 2021-02-04 DIAGNOSIS — I2510 Atherosclerotic heart disease of native coronary artery without angina pectoris: Secondary | ICD-10-CM

## 2021-02-04 MED ORDER — AMLODIPINE BESYLATE 5 MG PO TABS: 5.0000 mg | ORAL_TABLET | Freq: Every day | ORAL | 5 refills | Status: DC

## 2021-02-04 NOTE — Progress Notes (Signed)
Cardiology Office Note:    Date:  02/04/2021   ID:  Sharon Delgado, DOB Jul 25, 1965, MRN [id number]  PCP:  Patient, No Pcp Per (Inactive)   [facility] Providers Cardiologist:  Debbe Odea, MD     Referring MD: No ref. provider found   Chief Complaint  Patient presents with   Other    6 week follow up -- Meds reviewed verbally with patient.     History of Present Illness:    Sharon Delgado is a 55 y.o. female with a hx of CAD (lhc 12/2020 mod RCA, Lcx), NICM EF 35-40%, hypertension, former smoker x25+ years who presents for follow-up.  Is being seen for nonischemic cardiac currently on medication titration.  Tolerating current doses of Coreg 25 twice daily, Aldactone, Entresto.  Denies edema, breathing is better.  Her blood pressure at home ranges in the 130s to 150s systolic.  She otherwise feels well, takes her medications as prescribed.  Has been smoke-free ever since previous admission for heart failure.  Prior notes Echo 11/2020 EF 35 to 40%. Left heart cath 12/2020 nonobstructive CAD, proximal to mid RCA 40%, left circumflex 40%.  Past Medical History:  Diagnosis Date   CHF (congestive heart failure) (HCC)    Hypertension     Past Surgical History:  Procedure Laterality Date   LEFT HEART CATH AND CORONARY ANGIOGRAPHY N/A 12/10/2020   Procedure: LEFT HEART CATH AND CORONARY ANGIOGRAPHY;  Surgeon: Iran Ouch, MD;  Location: [facility] INVASIVE CV LAB;  Service: Cardiovascular;  Laterality: N/A;   NO PAST SURGERIES      Current Medications: Current Meds  Medication Sig   acetaminophen (TYLENOL) 500 MG tablet Take 500-1,000 mg by mouth every 6 (six) hours as needed for mild pain or moderate pain.   amLODipine (NORVASC) 5 MG tablet Take 1 tablet (5 mg total) by mouth daily.   aspirin EC 81 MG tablet Take 81 mg by mouth daily.   atorvastatin (LIPITOR) 40 MG tablet Take 1 tablet (40 mg total) by mouth daily.   carvedilol (COREG) 25 MG tablet Take 1 tablet (25 mg  total) by mouth 2 (two) times daily with a meal.   furosemide (LASIX) 40 MG tablet Take 1 tablet (40 mg total) by mouth daily.   isosorbide mononitrate (IMDUR) 30 MG 24 hr tablet Take 1 tablet (30 mg total) by mouth daily.   sacubitril-valsartan (ENTRESTO) 97-103 MG Take 1 tablet by mouth 2 (two) times daily.   spironolactone (ALDACTONE) 25 MG tablet Take 1 tablet (25 mg total) by mouth daily.     Allergies:   Patient has no known allergies.   Social History   Socioeconomic History   Marital status: Single    Spouse name: Not on file   Number of children: Not on file   Years of education: Not on file   Highest education level: Not on file  Occupational History   Not on file  Tobacco Use   Smoking status: Former    Types: Cigarettes, Cigars    Quit date: 12/06/2020    Years since quitting: 0.1   Smokeless tobacco: Never  Vaping Use   Vaping Use: Never used  Substance and Sexual Activity   Alcohol use: Not Currently   Drug use: No   Sexual activity: Not on file  Other Topics Concern   Not on file  Social History Narrative   Not on file   Social Determinants of Health   Financial Resource Strain: Not on file  Food Insecurity: Not on file  Transportation Needs: Not on file  Physical Activity: Not on file  Stress: Not on file  Social Connections: Not on file     Family History: The patient's family history includes Alcoholism in her father; COPD in her father; Diabetes in her mother; Hypertension in her mother.  ROS:   Please see the history of present illness.     All other systems reviewed and are negative.  EKGs/Labs/Other Studies Reviewed:    The following studies were reviewed today:   EKG:  EKG not  ordered today.    Recent Labs: 12/07/2020: ALT 59; B Natriuretic Peptide 952.8 12/08/2020: Hemoglobin 15.0; Platelets 245 12/11/2020: Magnesium 2.2 01/17/2021: BUN 25; Creatinine, Ser 1.27; Potassium 4.2; Sodium 139  Recent Lipid Panel    Component Value  Date/Time   CHOL 205 (H) 12/11/2020 0622   CHOL 157 07/11/2013 0613   TRIG 164 (H) 12/11/2020 0622   TRIG 131 07/11/2013 0613   HDL 34 (L) 12/11/2020 0622   HDL 30 (L) 07/11/2013 0613   CHOLHDL 6.0 12/11/2020 0622   VLDL 33 12/11/2020 0622   VLDL 26 07/11/2013 0613   LDLCALC 138 (H) 12/11/2020 0622   LDLCALC 101 (H) 07/11/2013 0613     Risk Assessment/Calculations:          Physical Exam:    VS:  BP 140/68 (BP Location: Left Arm, Patient Position: Sitting, Cuff Size: Normal)   Pulse (!) 53   Ht 5\' 1"  (1.549 m)   Wt 174 lb (78.9 kg)   SpO2 97%   BMI 32.88 kg/m     Wt Readings from Last 3 Encounters:  02/04/21 174 lb (78.9 kg)  01/17/21 174 lb 4 oz (79 kg)  12/24/20 169 lb (76.7 kg)     GEN:  Well nourished, well developed in no acute distress HEENT: Normal NECK: No JVD; No carotid bruits LYMPHATICS: No lymphadenopathy CARDIAC: RRR, no murmurs, rubs, gallops RESPIRATORY:  Clear to auscultation without rales, wheezing or rhonchi  ABDOMEN: Soft, non-tender, non-distended MUSCULOSKELETAL:  No edema; No deformity  SKIN: Warm and dry NEUROLOGIC:  Alert and oriented x 3 PSYCHIATRIC:  Normal affect   ASSESSMENT:    1. NICM (nonischemic cardiomyopathy) (HCC)   2. Coronary artery disease involving native coronary artery of native heart without angina pectoris   3. Primary hypertension   4. Pure hypercholesterolemia     PLAN:    In order of problems listed above:  Nonischemic cardiomyopathy, EF 35 to 40% describes NYHA class II-III symptoms.  She is euvolemic.  Continue Entresto to 97-103, Coreg 25 mg twice daily, Aldactone 25 mg daily.  Lasix 40 mg daily.  We will add Norvasc for better BP control.  Her cardiomyopathy is likely secondary to uncontrolled hypertension.  Recheck echocardiogram in 3 months.  Follow-up after. Nonobstructive CAD moderate RCA and left circumflex disease.  Denies chest pain.  Continue aspirin, Lipitor, reduce Imdur to 30 mg. Hypertension,  BP elevated.  Start Norvasc 5 mg daily, continue.  Coreg, Entresto, Aldactone as above. Hyperlipidemia, statin 40 mg daily.  Follow-up in 3 months after repeat echocardiogram.     Medication Adjustments/Labs and Tests Ordered: Current medicines are reviewed at length with the patient today.  Concerns regarding medicines are outlined above.  Orders Placed This Encounter  Procedures   ECHOCARDIOGRAM COMPLETE     Meds ordered this encounter  Medications   amLODipine (NORVASC) 5 MG tablet    Sig: Take 1 tablet (5 mg total)  by mouth daily.    Dispense:  30 tablet    Refill:  5      Patient Instructions  Medication Instructions:   Your physician has recommended you make the following change in your medication:   START taking Amlodipine 5 MG once a day.  *If you need a refill on your cardiac medications before your next appointment, please call your pharmacy*   Lab Work: None ordered If you have labs (blood work) drawn today and your tests are completely normal, you will receive your results only by: MyChart Message (if you have MyChart) OR A paper copy in the mail If you have any lab test that is abnormal or we need to change your treatment, we will call you to review the results.   Testing/Procedures:  Your physician has requested that you have an echocardiogram in 3 months. Echocardiography is a painless test that uses sound waves to create images of your heart. It provides your doctor with information about the size and shape of your heart and how well your heart's chambers and valves are working. This procedure takes approximately one hour. There are no restrictions for this procedure.    Follow-Up: At [facility], you and your health needs are our priority.  As part of our continuing mission to provide you with exceptional heart care, we have created designated Provider Care Teams.  These Care Teams include your primary Cardiologist (physician) and Advanced  Practice Providers (APPs -  Physician Assistants and Nurse Practitioners) who all work together to provide you with the care you need, when you need it.  We recommend signing up for the patient portal called "MyChart".  Sign up information is provided on this After Visit Summary.  MyChart is used to connect with patients for Virtual Visits (Telemedicine).  Patients are able to view lab/test results, encounter notes, upcoming appointments, etc.  Non-urgent messages can be sent to your provider as well.   To learn more about what you can do with MyChart, go to [email or web address].    Your next appointment:   3 month(s)  The format for your next appointment:   In Person  Provider:   You may see Debbe Odea, MD or one of the following Advanced Practice Providers on your designated Care Team:   Nicolasa Ducking, NP Eula Listen, PA-C Marisue Ivan, PA-C Cadence [place], [place]   Other Instructions        Signed, Debbe Odea, MD  02/04/2021 9:38 AM    [facility]

## 2021-02-04 NOTE — Patient Instructions (Signed)
Medication Instructions:   Your physician has recommended you make the following change in your medication:   START taking Amlodipine 5 MG once a day.  *If you need a refill on your cardiac medications before your next appointment, please call your pharmacy*   Lab Work: None ordered If you have labs (blood work) drawn today and your tests are completely normal, you will receive your results only by: MyChart Message (if you have MyChart) OR A paper copy in the mail If you have any lab test that is abnormal or we need to change your treatment, we will call you to review the results.   Testing/Procedures:  Your physician has requested that you have an echocardiogram in 3 months. Echocardiography is a painless test that uses sound waves to create images of your heart. It provides your doctor with information about the size and shape of your heart and how well your heart's chambers and valves are working. This procedure takes approximately one hour. There are no restrictions for this procedure.    Follow-Up: At [facility], you and your health needs are our priority.  As part of our continuing mission to provide you with exceptional heart care, we have created designated Provider Care Teams.  These Care Teams include your primary Cardiologist (physician) and Advanced Practice Providers (APPs -  Physician Assistants and Nurse Practitioners) who all work together to provide you with the care you need, when you need it.  We recommend signing up for the patient portal called "MyChart".  Sign up information is provided on this After Visit Summary.  MyChart is used to connect with patients for Virtual Visits (Telemedicine).  Patients are able to view lab/test results, encounter notes, upcoming appointments, etc.  Non-urgent messages can be sent to your provider as well.   To learn more about what you can do with MyChart, go to [email or web address].    Your next appointment:   3  month(s)  The format for your next appointment:   In Person  Provider:   You may see Debbe Odea, MD or one of the following Advanced Practice Providers on your designated Care Team:   Nicolasa Ducking, NP Eula Listen, PA-C Marisue Ivan, PA-C Cadence [place]ans[place], [place]   Other Instructions

## 2021-02-18 ENCOUNTER — Telehealth: Payer: Self-pay | Admitting: Cardiology

## 2021-02-18 NOTE — Telephone Encounter (Signed)
Recieved request from : [facility]  Processing pending patient call back. Lmov to discuss.   Placed in Forms Book

## 2021-03-22 NOTE — Progress Notes (Signed)
Patient ID: Sharon Delgado, female    DOB: 09-24-1965, 55 y.o.   MRN: [id number]  HPI  Sharon Delgado is a 55 y/o female with a history of HTN, recent tobacco use and heart failure.   Echo report from 12/07/20 reviewed and showed an EF of 35-40% along with mild MR.   LHC done 12/10/20 and showed: Prox RCA to Mid RCA lesion is 40% stenosed.   Mid RCA to Dist RCA lesion is 30% stenosed.   Prox Cx to Mid Cx lesion is 40% stenosed.   1.  Mild to moderate nonobstructive coronary artery disease 2.  Left ventricular angiography was not performed.  EF was moderately reduced by echo. 3.  High normal left ventricular end-diastolic pressure of 12 mmHg.  Admitted 12/07/20 due to new onset HF. Cardiology consult obtained. Initially given IV lasix and then transitioned to oral medications. Elevated troponin thought to be due to demand ischemia. Cath completed. Discharged after 5 days.   She presents today for a follow-up visit with a chief complaint of minimal shortness of breath upon moderate exertion. She describes this as chronic in nature having been present for several years. She has associated fatigue and gradual weight gain along with this. She denies any difficulty sleeping, dizziness, abdominal distention, palpitations, pedal edema, chest pain or cough.   Has not been smoking for the last 3 months and has noticed a gradual weight gain.   Has not taken any of her medications yet today  Past Medical History:  Diagnosis Date   CHF (congestive heart failure) (HCC)    Hypertension     Past Surgical History:  Procedure Laterality Date   LEFT HEART CATH AND CORONARY ANGIOGRAPHY N/A 12/10/2020   Procedure: LEFT HEART CATH AND CORONARY ANGIOGRAPHY;  Surgeon: Iran Ouch, MD;  Location: [facility] INVASIVE CV LAB;  Service: Cardiovascular;  Laterality: N/A;   NO PAST SURGERIES     Family History  Problem Relation Age of Onset   Diabetes Mother    Hypertension Mother    COPD Father    Alcoholism  Father    Social History   Tobacco Use   Smoking status: Former    Types: Cigarettes, Cigars    Quit date: 12/06/2020    Years since quitting: 0.2   Smokeless tobacco: Never  Substance Use Topics   Alcohol use: Not Currently   No Known Allergies  Prior to Admission medications   Medication Sig Start Date End Date Taking? Authorizing Provider  acetaminophen (TYLENOL) 500 MG tablet Take 500-1,000 mg by mouth every 6 (six) hours as needed for mild pain or moderate pain.   Yes [provider]  amLODipine (NORVASC) 5 MG tablet Take 1 tablet (5 mg total) by mouth daily. 02/04/21 05/05/21 Yes Agbor-Etang, Arlys John, MD  aspirin EC 81 MG tablet Take 81 mg by mouth daily.   Yes [provider]  atorvastatin (LIPITOR) 40 MG tablet Take 1 tablet (40 mg total) by mouth daily. 01/17/21  Yes Clarisa Kindred A, FNP  carvedilol (COREG) 25 MG tablet Take 1 tablet (25 mg total) by mouth 2 (two) times daily with a meal. 01/17/21  Yes Clarisa Kindred A, FNP  furosemide (LASIX) 40 MG tablet Take 1 tablet (40 mg total) by mouth daily. 01/17/21  Yes Clarisa Kindred A, FNP  isosorbide mononitrate (IMDUR) 30 MG 24 hr tablet Take 1 tablet (30 mg total) by mouth daily. 01/17/21  Yes Raneem Mendolia A, FNP  sacubitril-valsartan (ENTRESTO) 97-103 MG Take 1  tablet by mouth 2 (two) times daily. 12/24/20  Yes Debbe Odea, MD  spironolactone (ALDACTONE) 25 MG tablet Take 1 tablet (25 mg total) by mouth daily. 01/17/21  Yes Delma Freeze, FNP   Review of Systems  Constitutional:  Positive for fatigue. Negative for appetite change.  HENT:  Negative for congestion, postnasal drip and sore throat.   Eyes: Negative.   Respiratory:  Positive for shortness of breath ("at times"). Negative for cough and chest tightness.   Cardiovascular:  Negative for chest pain, palpitations and leg swelling.  Gastrointestinal:  Negative for abdominal distention and abdominal pain.  Endocrine: Negative.   Genitourinary:  Negative.   Musculoskeletal:  Negative for back pain and neck pain.  Skin: Negative.   Allergic/Immunologic: Negative.   Neurological:  Negative for dizziness and light-headedness.  Hematological:  Negative for adenopathy. Does not bruise/bleed easily.  Psychiatric/Behavioral:  Negative for dysphoric mood and sleep disturbance (sleeping on 1 pillow). The patient is not nervous/anxious.    Vitals:   03/23/21 0919  BP: (!) 181/70  Pulse: 60  Resp: 18  SpO2: 100%  Weight: 186 lb (84.4 kg)  Height: 5\' 1"  (1.549 m)   Wt Readings from Last 3 Encounters:  03/23/21 186 lb (84.4 kg)  02/04/21 174 lb (78.9 kg)  01/17/21 174 lb 4 oz (79 kg)   Lab Results  Component Value Date   CREATININE 1.27 (H) 01/17/2021   CREATININE 1.13 (H) 12/12/2020   CREATININE 1.14 (H) 12/11/2020    Physical Exam Vitals and nursing note reviewed.  Constitutional:      Appearance: Normal appearance.  HENT:     Head: Normocephalic and atraumatic.  Cardiovascular:     Rate and Rhythm: Normal rate and regular rhythm.  Pulmonary:     Effort: Pulmonary effort is normal. No respiratory distress.     Breath sounds: No wheezing or rales.  Abdominal:     General: There is no distension.     Palpations: Abdomen is soft.     Tenderness: There is no abdominal tenderness.  Musculoskeletal:     Cervical back: Normal range of motion and neck supple.     Right lower leg: No edema.     Left lower leg: No edema.  Skin:    General: Skin is warm and dry.  Neurological:     General: No focal deficit present.     Mental Status: She is alert and oriented to person, place, and time.  Psychiatric:        Mood and Affect: Mood normal.        Behavior: Behavior normal.        Thought Content: Thought content normal.    Assessment & Plan:  1: Chronic heart failure with reduced ejection fraction- - NYHA class II - euvolemic today - weighing daily; reminded to call for an overnight weight gain of > 2 pounds or a  weekly weight gain of > 5 pounds - weight up 12 pounds since last here 2 months ago - not adding salt and is looking at food labels although admits that she's really not sticking to a low sodium diet; low sodium cookbook provided and encouraged her to pick low sodium food items - on GDMT of carvedilol, entresto and spironolactone - consider adding SLGT2 but would need patient assistance; will apply for jardiance patient assistance today and if she gets approved, will plan on starting this - saw cardiology (Agbor-Etang) 02/04/21 - BNP 12/07/20 was 952.8  2: HTN-  -  BP elevated today (181/70) but she hasn't taken any of her medications yet today - checks her BP at home and says that her SBP runs 12's-150's; instructed to bring her BP log to her appointments and to take her medications prior to her appointments - needs to get established with PCP; [facility] so advised to get PCP that is local and convenient for her - BMP 01/17/21 reviewed and showed sodium 139, potassium 4.2, creatinine 1.27 and GFR 49  3: Tobacco use- - hasn't smoked in ~ 3 months - congratulated on that and continued cessation was encouraged - has gained weight since she quit smoking; discussed ways to increase her activity such as walking in place on commercials   Medication bottles reviewed.   Return in 2 months or sooner for any questions/problems before then.

## 2021-03-23 ENCOUNTER — Other Ambulatory Visit: Payer: Self-pay

## 2021-03-23 ENCOUNTER — Encounter: Payer: Self-pay | Admitting: Family

## 2021-03-23 ENCOUNTER — Ambulatory Visit: Payer: Medicaid Other | Attending: Family | Admitting: Family

## 2021-03-23 VITALS — BP 181/70 | HR 60 | Resp 18 | Ht 61.0 in | Wt 186.0 lb

## 2021-03-23 DIAGNOSIS — I428 Other cardiomyopathies: Secondary | ICD-10-CM

## 2021-03-23 DIAGNOSIS — I5022 Chronic systolic (congestive) heart failure: Secondary | ICD-10-CM | POA: Insufficient documentation

## 2021-03-23 DIAGNOSIS — I10 Essential (primary) hypertension: Secondary | ICD-10-CM

## 2021-03-23 DIAGNOSIS — Z79899 Other long term (current) drug therapy: Secondary | ICD-10-CM | POA: Insufficient documentation

## 2021-03-23 DIAGNOSIS — Z7182 Exercise counseling: Secondary | ICD-10-CM | POA: Insufficient documentation

## 2021-03-23 DIAGNOSIS — I110 Hypertensive heart disease with heart failure: Secondary | ICD-10-CM | POA: Insufficient documentation

## 2021-03-23 DIAGNOSIS — Z87891 Personal history of nicotine dependence: Secondary | ICD-10-CM | POA: Insufficient documentation

## 2021-03-23 DIAGNOSIS — I2510 Atherosclerotic heart disease of native coronary artery without angina pectoris: Secondary | ICD-10-CM | POA: Insufficient documentation

## 2021-03-23 DIAGNOSIS — Z720 Tobacco use: Secondary | ICD-10-CM

## 2021-03-23 NOTE — Patient Instructions (Addendum)
Continue weighing daily and call for an overnight weight gain of 3 pounds or more or a weekly weight gain of more than 5 pounds.    Bring blood pressure readings with you to your appointments   Take your medication before you come to your next appointment.    Get a primary care doctor in [facility]

## 2021-05-03 ENCOUNTER — Ambulatory Visit (INDEPENDENT_AMBULATORY_CARE_PROVIDER_SITE_OTHER): Payer: Self-pay

## 2021-05-03 ENCOUNTER — Other Ambulatory Visit: Payer: Self-pay

## 2021-05-03 DIAGNOSIS — I428 Other cardiomyopathies: Secondary | ICD-10-CM

## 2021-05-03 LAB — ECHOCARDIOGRAM COMPLETE
AR max vel: 2.39 cm2
AV Area VTI: 2.47 cm2
AV Area mean vel: 2.39 cm2
AV Mean grad: 5 mmHg
AV Peak grad: 11.7 mmHg
Ao pk vel: 1.71 m/s
Area-P 1/2: 2.83 cm2
Calc EF: 53.1 %
P 1/2 time: 595 msec
S' Lateral: 3.3 cm
Single Plane A2C EF: 54.3 %
Single Plane A4C EF: 55.6 %

## 2021-05-09 ENCOUNTER — Other Ambulatory Visit: Payer: Self-pay

## 2021-05-09 ENCOUNTER — Encounter: Payer: Self-pay | Admitting: Cardiology

## 2021-05-09 ENCOUNTER — Ambulatory Visit (INDEPENDENT_AMBULATORY_CARE_PROVIDER_SITE_OTHER): Payer: Self-pay | Admitting: Cardiology

## 2021-05-09 VITALS — BP 110/70 | HR 69 | Ht 61.0 in | Wt 193.0 lb

## 2021-05-09 DIAGNOSIS — I10 Essential (primary) hypertension: Secondary | ICD-10-CM

## 2021-05-09 DIAGNOSIS — I428 Other cardiomyopathies: Secondary | ICD-10-CM

## 2021-05-09 DIAGNOSIS — I2510 Atherosclerotic heart disease of native coronary artery without angina pectoris: Secondary | ICD-10-CM

## 2021-05-09 DIAGNOSIS — E7800 Pure hypercholesterolemia, unspecified: Secondary | ICD-10-CM

## 2021-05-09 MED ORDER — FUROSEMIDE 40 MG PO TABS
40.0000 mg | ORAL_TABLET | Freq: Every day | ORAL | 5 refills | Status: AC | PRN
Start: 2021-05-09 — End: ?

## 2021-05-09 NOTE — Patient Instructions (Signed)
Medication Instructions:   Your physician has recommended you make the following change in your medication:   ONLY take your Furosemide (Lasix) as needed for swelling.   *If you need a refill on your cardiac medications before your next appointment, please call your pharmacy*   Lab Work: None Ordered If you have labs (blood work) drawn today and your tests are completely normal, you will receive your results only by: MyChart Message (if you have MyChart) OR A paper copy in the mail If you have any lab test that is abnormal or we need to change your treatment, we will call you to review the results.   Testing/Procedures: None ordered   Follow-Up: At [facility], you and your health needs are our priority.  As part of our continuing mission to provide you with exceptional heart care, we have created designated Provider Care Teams.  These Care Teams include your primary Cardiologist (physician) and Advanced Practice Providers (APPs -  Physician Assistants and Nurse Practitioners) who all work together to provide you with the care you need, when you need it.  We recommend signing up for the patient portal called "MyChart".  Sign up information is provided on this After Visit Summary.  MyChart is used to connect with patients for Virtual Visits (Telemedicine).  Patients are able to view lab/test results, encounter notes, upcoming appointments, etc.  Non-urgent messages can be sent to your provider as well.   To learn more about what you can do with MyChart, go to [email or web address].    Your next appointment:   6 month(s)  The format for your next appointment:   In Person  Provider:   You may see Debbe Odea, MD or one of the following Advanced Practice Providers on your designated Care Team:   Nicolasa Ducking, NP Eula Listen, PA-C Cadence Fr[place], [place]    Other Instructions

## 2021-05-09 NOTE — Progress Notes (Signed)
Cardiology Office Note:    Date:  05/09/2021   ID:  SARELY STRACENER, DOB [birth year], MRN [id number]  PCP:  Patient, No Pcp Per (Inactive)   [facility] Providers Cardiologist:  Debbe Odea, MD     Referring MD: No ref. provider found   Chief Complaint  Patient presents with   Follow-up    History of Present Illness:    Sharon Delgado is a 56 y.o. female with a hx of CAD (lhc 12/2020 mod RCA, Lcx), NICM EF 35-40%, hypertension, former smoker x25+ years who presents for follow-up.  Is being seen for nonischemic cardiac currently on medication titration.  Tolerating current doses of Coreg 25 twice daily, Aldactone, Entresto.  Denies edema, breathing is better.  Norvasc 5 mg daily was added for better BP control.  Repeat echocardiogram was performed to evaluate ejection fraction on optimal medical therapy.  Denies edema, shortness of breath, she takes medications as prescribed.    Prior notes Echo 11/2020 EF 35 to 40%. Left heart cath 12/2020 nonobstructive CAD, proximal to mid RCA 40%, left circumflex 40%.  Past Medical History:  Diagnosis Date   CHF (congestive heart failure) (HCC)    Hypertension     Past Surgical History:  Procedure Laterality Date   LEFT HEART CATH AND CORONARY ANGIOGRAPHY N/A 12/10/2020   Procedure: LEFT HEART CATH AND CORONARY ANGIOGRAPHY;  Surgeon: Iran Ouch, MD;  Location: [facility] INVASIVE CV LAB;  Service: Cardiovascular;  Laterality: N/A;   NO PAST SURGERIES      Current Medications: Current Meds  Medication Sig   acetaminophen (TYLENOL) 500 MG tablet Take 500-1,000 mg by mouth every 6 (six) hours as needed for mild pain or moderate pain.   amLODipine (NORVASC) 5 MG tablet Take 1 tablet (5 mg total) by mouth daily.   aspirin EC 81 MG tablet Take 81 mg by mouth daily.   atorvastatin (LIPITOR) 40 MG tablet Take 1 tablet (40 mg total) by mouth daily.   carvedilol (COREG) 25 MG tablet Take 1 tablet (25 mg total) by mouth 2 (two) times daily  with a meal.   isosorbide mononitrate (IMDUR) 30 MG 24 hr tablet Take 1 tablet (30 mg total) by mouth daily.   sacubitril-valsartan (ENTRESTO) 97-103 MG Take 1 tablet by mouth 2 (two) times daily.   spironolactone (ALDACTONE) 25 MG tablet Take 1 tablet (25 mg total) by mouth daily.   [DISCONTINUED] furosemide (LASIX) 40 MG tablet Take 1 tablet (40 mg total) by mouth daily.     Allergies:   Patient has no known allergies.   Social History   Socioeconomic History   Marital status: Single    Spouse name: Not on file   Number of children: Not on file   Years of education: Not on file   Highest education level: Not on file  Occupational History   Not on file  Tobacco Use   Smoking status: Former    Types: Cigarettes, Cigars    Quit date: 12/06/2020    Years since quitting: 0.4   Smokeless tobacco: Never  Vaping Use   Vaping Use: Never used  Substance and Sexual Activity   Alcohol use: Not Currently    Alcohol/week: 15.0 standard drinks    Types: 15 Cans of beer per week   Drug use: No   Sexual activity: Not on file  Other Topics Concern   Not on file  Social History Narrative   Not on file   Social Determinants of Health  Financial Resource Strain: Not on file  Food Insecurity: Not on file  Transportation Needs: Not on file  Physical Activity: Not on file  Stress: Not on file  Social Connections: Not on file     Family History: The patient's family history includes Alcoholism in her father; COPD in her father; Diabetes in her mother; Hypertension in her mother.  ROS:   Please see the history of present illness.     All other systems reviewed and are negative.  EKGs/Labs/Other Studies Reviewed:    The following studies were reviewed today:   EKG:  EKG not  ordered today.    Recent Labs: 12/07/2020: ALT 59; B Natriuretic Peptide 952.8 12/08/2020: Hemoglobin 15.0; Platelets 245 12/11/2020: Magnesium 2.2 01/17/2021: BUN 25; Creatinine, Ser 1.27; Potassium 4.2;  Sodium 139  Recent Lipid Panel    Component Value Date/Time   CHOL 205 (H) 12/11/2020 0622   CHOL 157 07/11/2013 0613   TRIG 164 (H) 12/11/2020 0622   TRIG 131 07/11/2013 0613   HDL 34 (L) 12/11/2020 0622   HDL 30 (L) 07/11/2013 0613   CHOLHDL 6.0 12/11/2020 0622   VLDL 33 12/11/2020 0622   VLDL 26 07/11/2013 0613   LDLCALC 138 (H) 12/11/2020 0622   LDLCALC 101 (H) 07/11/2013 0613     Risk Assessment/Calculations:          Physical Exam:    VS:  BP 110/70 (BP Location: Left Arm, Patient Position: Sitting, Cuff Size: Large)    Pulse 69    Ht 5\' 1"  (1.549 m)    Wt 193 lb (87.5 kg)    SpO2 96%    BMI 36.47 kg/m     Wt Readings from Last 3 Encounters:  05/09/21 193 lb (87.5 kg)  03/23/21 186 lb (84.4 kg)  02/04/21 174 lb (78.9 kg)     GEN:  Well nourished, well developed in no acute distress HEENT: Normal NECK: No JVD; No carotid bruits LYMPHATICS: No lymphadenopathy CARDIAC: RRR, no murmurs, rubs, gallops RESPIRATORY:  Clear to auscultation without rales, wheezing or rhonchi  ABDOMEN: Soft, non-tender, non-distended MUSCULOSKELETAL:  No edema; No deformity  SKIN: Warm and dry NEUROLOGIC:  Alert and oriented x 3 PSYCHIATRIC:  Normal affect   ASSESSMENT:    1. NICM (nonischemic cardiomyopathy) (HCC)   2. Coronary artery disease involving native coronary artery of native heart without angina pectoris   3. Primary hypertension   4. Pure hypercholesterolemia     PLAN:    In order of problems listed above:  Nonischemic cardiomyopathy, likely from hypertension.  Initial EF 35 to 40%, last EF 04/2021 EF 60-65%. describes NYHA class II symptoms.  She is euvolemic.  Continue Entresto to 97-103, Coreg 25 mg twice daily, Aldactone 25 mg daily.  Take Lasix 40 mg as needed.     Nonobstructive CAD moderate RCA and left circumflex disease.  Denies chest pain.  Continue aspirin, Lipitor, Imdur to 30 mg. Hypertension, BP controlled.  Continue Norvasc 5 mg daily, Coreg, Entresto,  Aldactone. Hyperlipidemia, Lipitor 40 mg daily.  Follow-up in 6 months.      Medication Adjustments/Labs and Tests Ordered: Current medicines are reviewed at length with the patient today.  Concerns regarding medicines are outlined above.  No orders of the defined types were placed in this encounter.    Meds ordered this encounter  Medications   furosemide (LASIX) 40 MG tablet    Sig: Take 1 tablet (40 mg total) by mouth daily as needed. For swelling.  Dispense:  30 tablet    Refill:  5    NO NEED TO REFILL at this time. Changing instructions to PRN.      Patient Instructions  Medication Instructions:   Your physician has recommended you make the following change in your medication:   ONLY take your Furosemide (Lasix) as needed for swelling.   *If you need a refill on your cardiac medications before your next appointment, please call your pharmacy*   Lab Work: None Ordered If you have labs (blood work) drawn today and your tests are completely normal, you will receive your results only by: MyChart Message (if you have MyChart) OR A paper copy in the mail If you have any lab test that is abnormal or we need to change your treatment, we will call you to review the results.   Testing/Procedures: None ordered   Follow-Up: At [facility], you and your health needs are our priority.  As part of our continuing mission to provide you with exceptional heart care, we have created designated Provider Care Teams.  These Care Teams include your primary Cardiologist (physician) and Advanced Practice Providers (APPs -  Physician Assistants and Nurse Practitioners) who all work together to provide you with the care you need, when you need it.  We recommend signing up for the patient portal called "MyChart".  Sign up information is provided on this After Visit Summary.  MyChart is used to connect with patients for Virtual Visits (Telemedicine).  Patients are able to view lab/test  results, encounter notes, upcoming appointments, etc.  Non-urgent messages can be sent to your provider as well.   To learn more about what you can do with MyChart, go to [email or web address].    Your next appointment:   6 month(s)  The format for your next appointment:   In Person  Provider:   You may see Debbe Odea, MD or one of the following Advanced Practice Providers on your designated Care Team:   Nicolasa Ducking, NP Eula Listen, PA-C Cadence Fransico Michael, [place]    Other Instructions     Signed, Debbe Odea, MD  05/09/2021 10:00 AM    [facility]

## 2021-05-23 NOTE — Progress Notes (Signed)
Patient ID: Sharon Delgado, female    DOB: 1966-01-23, 56 y.o.   MRN: [id number]  HPI  Sharon Delgado is a 56 y/o female with a history of HTN, recent tobacco use and heart failure.   Echo report from 05/03/21 reviewed and showed an EF of 60-65%. Echo report from 12/07/20 reviewed and showed an EF of 35-40% along with mild MR.   LHC done 12/10/20 and showed: Prox RCA to Mid RCA lesion is 40% stenosed.   Mid RCA to Dist RCA lesion is 30% stenosed.   Prox Cx to Mid Cx lesion is 40% stenosed.   1.  Mild to moderate nonobstructive coronary artery disease 2.  Left ventricular angiography was not performed.  EF was moderately reduced by echo. 3.  High normal left ventricular end-diastolic pressure of 12 mmHg.  Admitted 12/07/20 due to new onset HF. Cardiology consult obtained. Initially given IV lasix and then transitioned to oral medications. Elevated troponin thought to be due to demand ischemia. Cath completed. Discharged after 5 days.   She presents today for a follow-up visit with a chief complaint of minimal shortness of breath with little exertion. Describes this as chronic in nature. She has associated fatigue, wheezing, occasional light-headedness and gradual weight gain along with this. She denies any difficulty sleeping, abdominal distention, palpitations, pedal edema, chest pain or cough.   Continues to not smoke.   Past Medical History:  Diagnosis Date   CHF (congestive heart failure) (HCC)    Hypertension     Past Surgical History:  Procedure Laterality Date   LEFT HEART CATH AND CORONARY ANGIOGRAPHY N/A 12/10/2020   Procedure: LEFT HEART CATH AND CORONARY ANGIOGRAPHY;  Surgeon: Iran Ouch, MD;  Location: [facility] INVASIVE CV LAB;  Service: Cardiovascular;  Laterality: N/A;   NO PAST SURGERIES     Family History  Problem Relation Age of Onset   Diabetes Mother    Hypertension Mother    COPD Father    Alcoholism Father    Social History   Tobacco Use   Smoking status:  Former    Types: Cigarettes, Cigars    Quit date: 12/06/2020    Years since quitting: 0.4   Smokeless tobacco: Never  Substance Use Topics   Alcohol use: Not Currently    Alcohol/week: 15.0 standard drinks    Types: 15 Cans of beer per week   No Known Allergies  Prior to Admission medications   Medication Sig Start Date End Date Taking? Authorizing Provider  acetaminophen (TYLENOL) 500 MG tablet Take 500-1,000 mg by mouth every 6 (six) hours as needed for mild pain or moderate pain.   Yes [provider]  amLODipine (NORVASC) 5 MG tablet Take 1 tablet (5 mg total) by mouth daily. 02/04/21  Yes Debbe Odea, MD  aspirin EC 81 MG tablet Take 81 mg by mouth daily.   Yes [provider]  atorvastatin (LIPITOR) 40 MG tablet Take 1 tablet (40 mg total) by mouth daily. 01/17/21  Yes Clarisa Kindred A, FNP  carvedilol (COREG) 25 MG tablet Take 1 tablet (25 mg total) by mouth 2 (two) times daily with a meal. 01/17/21  Yes Clarisa Kindred A, FNP  furosemide (LASIX) 40 MG tablet Take 1 tablet (40 mg total) by mouth daily as needed. For swelling. 05/09/21 06/08/21 Yes Agbor-Etang, Arlys John, MD  isosorbide mononitrate (IMDUR) 30 MG 24 hr tablet Take 1 tablet (30 mg total) by mouth daily. 01/17/21  Yes Tyus Kallam, Jarold Song, FNP  sacubitril-valsartan (ENTRESTO)  97-103 MG Take 1 tablet by mouth 2 (two) times daily. 12/24/20  Yes Debbe Odea, MD  spironolactone (ALDACTONE) 25 MG tablet Take 1 tablet (25 mg total) by mouth daily. 01/17/21  Yes Delma Freeze, FNP    Review of Systems  Constitutional:  Positive for fatigue. Negative for appetite change.  HENT:  Negative for congestion, postnasal drip and sore throat.   Eyes: Negative.   Respiratory:  Positive for shortness of breath ("at times") and wheezing. Negative for cough and chest tightness.   Cardiovascular:  Negative for chest pain, palpitations and leg swelling.  Gastrointestinal:  Negative for abdominal distention and abdominal  pain.  Endocrine: Negative.   Genitourinary: Negative.   Musculoskeletal:  Negative for back pain and neck pain.  Skin: Negative.   Allergic/Immunologic: Negative.   Neurological:  Positive for light-headedness (at times). Negative for dizziness.  Hematological:  Negative for adenopathy. Does not bruise/bleed easily.  Psychiatric/Behavioral:  Negative for dysphoric mood and sleep disturbance (sleeping on 1 pillow). The patient is not nervous/anxious.    Vitals:   05/25/21 0841  BP: (!) 143/65  Pulse: (!) 52  Resp: 16  SpO2: 100%  Weight: 197 lb (89.4 kg)  Height: 5\' 1"  (1.549 m)   Wt Readings from Last 3 Encounters:  05/25/21 197 lb (89.4 kg)  05/09/21 193 lb (87.5 kg)  03/23/21 186 lb (84.4 kg)   Lab Results  Component Value Date   CREATININE 1.27 (H) 01/17/2021   CREATININE 1.13 (H) 12/12/2020   CREATININE 1.14 (H) 12/11/2020   Physical Exam Vitals and nursing note reviewed.  Constitutional:      Appearance: Normal appearance.  HENT:     Head: Normocephalic and atraumatic.  Cardiovascular:     Rate and Rhythm: Normal rate and regular rhythm.  Pulmonary:     Effort: Pulmonary effort is normal. No respiratory distress.     Breath sounds: No wheezing or rales.  Abdominal:     General: There is no distension.     Palpations: Abdomen is soft.     Tenderness: There is no abdominal tenderness.  Musculoskeletal:     Cervical back: Normal range of motion and neck supple.     Right lower leg: No edema.     Left lower leg: No edema.  Skin:    General: Skin is warm and dry.  Neurological:     General: No focal deficit present.     Mental Status: She is alert and oriented to person, place, and time.  Psychiatric:        Mood and Affect: Mood normal.        Behavior: Behavior normal.        Thought Content: Thought content normal.    Assessment & Plan:  1: Chronic heart failure with now preserved ejection fraction without structural changes- - NYHA class II -  euvolemic today - weighing daily; reminded to call for an overnight weight gain of > 2 pounds or a weekly weight gain of > 5 pounds - weight up 11 pounds since last here 2 months ago; encouraged her to incorporate increased activity in her day - not adding salt and is looking at food labels although admits that eating low sodium foods remains a challenge - on GDMT of carvedilol, entresto and spironolactone - will add jardiance 10mg  daily and patient assistance forms filled out today; 30 day voucher provided - will check BMP at next visit - saw cardiology (Agbor-Etang) 05/09/21 - taking furosemide PRN and  says the last time she took was ~ 1 week ago - BNP 12/07/20 was 952.8 - PharmD reconciled medications with the patient  2: HTN-  - BP mildly elevated (143/65) - needs to get established with PCP; living in [facility] so advised to get PCP that is local and convenient for her - BMP 01/17/21 reviewed and showed sodium 139, potassium 4.2, creatinine 1.27 and GFR 49  3: Tobacco use- - hasn't smoked in ~ 4 months - congratulated on that and continued cessation was encouraged - has gained weight since she quit smoking; discussed ways to increase her activity such as walking in place on commercials and parking farther from the door when shopping   Medication bottles reviewed.   Return in 1 month, sooner if needed.

## 2021-05-25 ENCOUNTER — Telehealth: Payer: Self-pay | Admitting: Family

## 2021-05-25 ENCOUNTER — Encounter: Payer: Self-pay | Admitting: Pharmacist

## 2021-05-25 ENCOUNTER — Ambulatory Visit: Payer: Medicaid Other | Attending: Family | Admitting: Family

## 2021-05-25 ENCOUNTER — Encounter: Payer: Self-pay | Admitting: Family

## 2021-05-25 ENCOUNTER — Other Ambulatory Visit: Payer: Self-pay

## 2021-05-25 VITALS — BP 143/65 | HR 52 | Resp 16 | Ht 61.0 in | Wt 197.0 lb

## 2021-05-25 DIAGNOSIS — R0602 Shortness of breath: Secondary | ICD-10-CM | POA: Insufficient documentation

## 2021-05-25 DIAGNOSIS — Z7182 Exercise counseling: Secondary | ICD-10-CM | POA: Insufficient documentation

## 2021-05-25 DIAGNOSIS — Z720 Tobacco use: Secondary | ICD-10-CM

## 2021-05-25 DIAGNOSIS — Z87891 Personal history of nicotine dependence: Secondary | ICD-10-CM | POA: Insufficient documentation

## 2021-05-25 DIAGNOSIS — Z79899 Other long term (current) drug therapy: Secondary | ICD-10-CM | POA: Insufficient documentation

## 2021-05-25 DIAGNOSIS — R42 Dizziness and giddiness: Secondary | ICD-10-CM | POA: Insufficient documentation

## 2021-05-25 DIAGNOSIS — Z09 Encounter for follow-up examination after completed treatment for conditions other than malignant neoplasm: Secondary | ICD-10-CM | POA: Insufficient documentation

## 2021-05-25 DIAGNOSIS — I10 Essential (primary) hypertension: Secondary | ICD-10-CM

## 2021-05-25 DIAGNOSIS — I509 Heart failure, unspecified: Secondary | ICD-10-CM | POA: Insufficient documentation

## 2021-05-25 DIAGNOSIS — I110 Hypertensive heart disease with heart failure: Secondary | ICD-10-CM | POA: Insufficient documentation

## 2021-05-25 DIAGNOSIS — Z7901 Long term (current) use of anticoagulants: Secondary | ICD-10-CM | POA: Insufficient documentation

## 2021-05-25 DIAGNOSIS — I5032 Chronic diastolic (congestive) heart failure: Secondary | ICD-10-CM

## 2021-05-25 DIAGNOSIS — I2510 Atherosclerotic heart disease of native coronary artery without angina pectoris: Secondary | ICD-10-CM | POA: Insufficient documentation

## 2021-05-25 MED ORDER — EMPAGLIFLOZIN 10 MG PO TABS: 10.0000 mg | ORAL_TABLET | Freq: Every day | ORAL | 5 refills | Status: DC

## 2021-05-25 NOTE — Progress Notes (Signed)
[facility] - HEART FAILURE CLINIC - PHARMACIST COUNSELING NOTE  *HFrEF*  Guideline-Directed Medical Therapy/Evidence Based Medicine  ACE/ARB/ARNI: Sacubitril-valsartan 97-103 mg twice daily Beta Blocker: Carvedilol 25 mg twice daily Aldosterone Antagonist: Spironolactone 25 mg daily Diuretic: Furosemide 40 mg daily  PRN SGLT2i:  Jardiance 10mg  daily ?? - waiting for patient assistance.  Adherence Assessment  Do you ever forget to take your medication? [] Yes [x] No  Do you ever skip doses due to side effects? [] Yes [x] No  Do you have trouble affording your medicines? [x] Yes [x] No  Are you ever unable to pick up your medication due to transportation difficulties? [] Yes [x] No  Do you ever stop taking your medications because you don't believe they are helping? [] Yes [x] No  Do you check your weight daily? [x] Yes [] No   Adherence strategy: pill box  Barriers to obtaining medications: Jardiance - will complete patient assistance form today  Vital signs: HR 52, BP 143/65, weight (pounds) 197 lbs  ECHO: Date 12/07/20, EF 35-40%, notes left ventricle has moderately decreased function. The left ventricle demonstrates global hypokinesis. There is moderate left ventricular hypertrophy.  Cath: Date 12/10/20, notes mild to moderate nonobstructive coronary artery disease.No angiography performed.  BMP Latest Ref Rng & Units 01/17/2021 12/12/2020 12/11/2020  Glucose 70 - 99 mg/dL 92 ) )  BUN 6 - 20 mg/dL ) ) )  Creatinine 0.44 - 1.00 mg/dL ) ) )  Sodium 135 - 145 mmol/L 139 140 141  Potassium 3.5 - 5.1 mmol/L 4.2 3.8 4.0  Chloride 98 - 111 mmol/L 105 104 104  CO2 22 - 32 mmol/L 27 29 28   Calcium 8.9 - 10.3 mg/dL 9.0 12/09/20) 02/09/21)    Past Medical History:  Diagnosis Date   CHF (congestive heart failure) (HCC)    Hypertension     ASSESSMENT 56 year old female who presents to the HF clinic for follow up. PMH includes CAD (lhc  12/2020 mod RCA, Lcx), NICM EF 35-40%, hypertension, former smoker x25+.  Patient denies problems with current medication and reports compliance. Denies fever, dizziness, increased fatigue, or urinary issues. Plan was to start SGLT2i if patient assistance was approved , but patient never completed form. Counseling on strategies to decrease salt in diet provided during visit.  Recent ED Visit (past 6 months): Date - 12/12/2020, [facility] - SOB (new dx HF)  PLAN  Work on decreasing sodium in diet by limiting use of sauces, avoid adding salt to already cooked meals, and read labels prior to product selection. Add Jardiance 10mg  daily to current regimen. Continue to weight every day and use furosemide PRN.  Time spent: 15 minutes  Jashawn Floyd Rodriguez-Guzman PharmD, BCPS 05/25/2021 9:01 AM   Current Outpatient Medications:    acetaminophen (TYLENOL) 500 MG tablet, Take 500-1,000 mg by mouth every 6 (six) hours as needed for mild pain or moderate pain., Disp: , Rfl:    amLODipine (NORVASC) 5 MG tablet, Take 1 tablet (5 mg total) by mouth daily., Disp: 30 tablet, Rfl: 5   aspirin EC 81 MG tablet, Take 81 mg by mouth daily., Disp: , Rfl:    atorvastatin (LIPITOR) 40 MG tablet, Take 1 tablet (40 mg total) by mouth daily., Disp: 30 tablet, Rfl: 5   carvedilol (COREG) 25 MG tablet, Take 1 tablet (25 mg total) by mouth 2 (two) times daily with a meal., Disp: 60 tablet, Rfl: 5   empagliflozin (JARDIANCE) 10 MG TABS tablet, Take 1 tablet (10 mg total) by mouth daily before breakfast., Disp: 30  tablet, Rfl: 5   furosemide (LASIX) 40 MG tablet, Take 1 tablet (40 mg total) by mouth daily as needed. For swelling., Disp: 30 tablet, Rfl: 5   isosorbide mononitrate (IMDUR) 30 MG 24 hr tablet, Take 1 tablet (30 mg total) by mouth daily., Disp: 30 tablet, Rfl: 5   sacubitril-valsartan (ENTRESTO) 97-103 MG, Take 1 tablet by mouth 2 (two) times daily., Disp: 60 tablet, Rfl:    spironolactone (ALDACTONE) 25 MG tablet, Take 1  tablet (25 mg total) by mouth daily., Disp: 30 tablet, Rfl: 5  MEDICATION ADHERENCES TIPS AND STRATEGIES Taking medication as prescribed improves patient outcomes in heart failure (reduces hospitalizations, improves symptoms, increases survival) Side effects of medications can be managed by decreasing doses, switching agents, stopping drugs, or adding additional therapy. Please let someone in the Heart Failure Clinic know if you have having bothersome side effects so we can modify your regimen. Do not alter your medication regimen without talking to [place].  Medication reminders can help patients remember to take drugs on time. If you are missing or forgetting doses you can try linking behaviors, using pill boxes, or an electronic reminder like an alarm on your phone or an app. Some people can also get automated phone calls as medication reminders.

## 2021-05-25 NOTE — Patient Instructions (Addendum)
Continue weighing daily and call for an overnight weight gain of 3 pounds or more or a weekly weight gain of more than 5 pounds.    Begin jardiance as 1 tablet every morning

## 2021-05-25 NOTE — Telephone Encounter (Signed)
Faxed Jardiance patient assistance application for approval on 2/15   [place], [place]

## 2021-06-22 NOTE — Progress Notes (Signed)
? Patient ID: Sharon Delgado, female    DOB: [birth year], 56 y.o.   MRN: [id number] ? ?HPI ? ?Sharon Delgado is a 56 y/o female with a history of HTN, recent tobacco use and heart failure.  ? ?Echo report from 05/03/21 reviewed and showed an EF of 60-65%. Echo report from 12/07/20 reviewed and showed an EF of 35-40% along with mild MR.  ? ?LHC done 12/10/20 and showed: ?Prox RCA to Mid RCA lesion is 40% stenosed. ?  Mid RCA to Dist RCA lesion is 30% stenosed. ?  Prox Cx to Mid Cx lesion is 40% stenosed. ?  ?1.  Mild to moderate nonobstructive coronary artery disease ?2.  Left ventricular angiography was not performed.  EF was moderately reduced by echo. ?3.  High normal left ventricular end-diastolic pressure of 12 mmHg. ? ?Has not been admitted or been in the ED in the last 6 months.  ? ?She presents today for a follow-up visit with a chief complaint of minimal fatigue upon moderate exertion. She describes this as chronic in nature. She has associated wheezing along with this. She denies any difficulty sleeping, dizziness, cough, shortness of breath, chest pain, pedal edema, palpitations, abdominal distention or weight gain.  ? ?Initially after starting jardiance, she developed N/V, diarrhea and abdominal pain. She stopped taking it until her symptoms resolved, restarted it and has had no further occurrence of GI symptoms.  ? ?Past Medical History:  ?Diagnosis Date  ? CHF (congestive heart failure) (HCC)   ? Hypertension   ? ? ?Past Surgical History:  ?Procedure Laterality Date  ? LEFT HEART CATH AND CORONARY ANGIOGRAPHY N/A 12/10/2020  ? Procedure: LEFT HEART CATH AND CORONARY ANGIOGRAPHY;  Surgeon: Iran Ouch, MD;  Location: [facility] INVASIVE CV LAB;  Service: Cardiovascular;  Laterality: N/A;  ? NO PAST SURGERIES    ? ?Family History  ?Problem Relation Age of Onset  ? Diabetes Mother   ? Hypertension Mother   ? COPD Father   ? Alcoholism Father   ? ?Social History  ? ?Tobacco Use  ? Smoking status: Former  ?  Types:  Cigarettes, Cigars  ?  Quit date: 12/06/2020  ?  Years since quitting: 0.5  ? Smokeless tobacco: Never  ?Substance Use Topics  ? Alcohol use: Not Currently  ?  Alcohol/week: 15.0 standard drinks  ?  Types: 15 Cans of beer per week  ? ?No Known Allergies ? ?Prior to Admission medications   ?Medication Sig Start Date End Date Taking? Authorizing Provider  ?acetaminophen (TYLENOL) 500 MG tablet Take 500-1,000 mg by mouth every 6 (six) hours as needed for mild pain or moderate pain.   Yes [provider]  ?amLODipine (NORVASC) 5 MG tablet Take 1 tablet (5 mg total) by mouth daily. 02/04/21  Yes Debbe Odea, MD  ?aspirin EC 81 MG tablet Take 81 mg by mouth daily.   Yes [provider]  ?atorvastatin (LIPITOR) 40 MG tablet Take 1 tablet (40 mg total) by mouth daily. 01/17/21  Yes Delma Freeze, FNP  ?carvedilol (COREG) 25 MG tablet Take 1 tablet (25 mg total) by mouth 2 (two) times daily with a meal. 01/17/21  Yes Delma Freeze, FNP  ?empagliflozin (JARDIANCE) 10 MG TABS tablet Take 1 tablet (10 mg total) by mouth daily before breakfast. 05/25/21  Yes Delma Freeze, FNP  ?furosemide (LASIX) 40 MG tablet Take 1 tablet (40 mg total) by mouth daily as needed. For swelling. 05/09/21  Yes Debbe Odea, MD  ?  isosorbide mononitrate (IMDUR) 30 MG 24 hr tablet Take 1 tablet (30 mg total) by mouth daily. 01/17/21  Yes Delma Freeze, FNP  ?sacubitril-valsartan (ENTRESTO) 97-103 MG Take 1 tablet by mouth 2 (two) times daily. 12/24/20  Yes Debbe Odea, MD  ?spironolactone (ALDACTONE) 25 MG tablet Take 1 tablet (25 mg total) by mouth daily. 01/17/21  Yes Delma Freeze, FNP  ? ?Review of Systems  ?Constitutional:  Positive for fatigue. Negative for appetite change.  ?HENT:  Negative for congestion, postnasal drip and sore throat.   ?Eyes: Negative.   ?Respiratory:  Positive for wheezing. Negative for cough, chest tightness and shortness of breath.   ?Cardiovascular:  Negative for chest pain,  palpitations and leg swelling.  ?Gastrointestinal:  Negative for abdominal distention and abdominal pain.  ?Endocrine: Negative.   ?Genitourinary: Negative.   ?Musculoskeletal:  Negative for back pain and neck pain.  ?Skin: Negative.   ?Allergic/Immunologic: Negative.   ?Neurological:  Negative for dizziness and light-headedness.  ?Hematological:  Negative for adenopathy. Does not bruise/bleed easily.  ?Psychiatric/Behavioral:  Negative for dysphoric mood and sleep disturbance (sleeping on 1 pillow). The patient is not nervous/anxious.   ? ?Vitals:  ? 06/23/21 1302  ?BP: (!) 123/55  ?Pulse: (!) 51  ?Resp: 14  ?SpO2: 100%  ?Weight: 197 lb 6 oz (89.5 kg)  ?Height: 5\' 1"  (1.549 m)  ? ?Wt Readings from Last 3 Encounters:  ?06/23/21 197 lb 6 oz (89.5 kg)  ?05/25/21 197 lb (89.4 kg)  ?05/09/21 193 lb (87.5 kg)  ? ?Lab Results  ?Component Value Date  ? CREATININE 1.64 (H) 06/23/2021  ? CREATININE 1.27 (H) 01/17/2021  ? CREATININE 1.13 (H) 12/12/2020  ? ?Physical Exam ?Vitals and nursing note reviewed.  ?Constitutional:   ?   Appearance: Normal appearance.  ?HENT:  ?   Head: Normocephalic and atraumatic.  ?Cardiovascular:  ?   Rate and Rhythm: Regular rhythm. Bradycardia present.  ?Pulmonary:  ?   Effort: Pulmonary effort is normal. No respiratory distress.  ?   Breath sounds: No wheezing or rales.  ?Abdominal:  ?   General: There is no distension.  ?   Palpations: Abdomen is soft.  ?   Tenderness: There is no abdominal tenderness.  ?Musculoskeletal:  ?   Cervical back: Normal range of motion and neck supple.  ?   Right lower leg: No edema.  ?   Left lower leg: No edema.  ?Skin: ?   General: Skin is warm and dry.  ?Neurological:  ?   General: No focal deficit present.  ?   Mental Status: She is alert and oriented to person, place, and time.  ?Psychiatric:     ?   Mood and Affect: Mood normal.     ?   Behavior: Behavior normal.     ?   Thought Content: Thought content normal.  ? ?Assessment & Plan: ? ?1: Chronic heart  failure with preserved ejection fraction without structural changes- ?- NYHA class II ?- euvolemic today ?- weighing daily; reminded to call for an overnight weight gain of > 2 pounds or a weekly weight gain of > 5 pounds ?- weight unchanged since last here 1 month ago ?- not adding salt and is looking at food labels although admits that eating low sodium foods remains a challenge ?- on GDMT of carvedilol, entresto, jardiance and spironolactone ?- will check BMP today ?- saw cardiology (Agbor-Etang) 05/09/21 ?- taking furosemide PRN  ?- BNP 12/07/20 was 952.8 ? ?2: HTN-  ?-  BP looks good (123/55) ?- needs to get established with PCP; living in [facility] so advised to get PCP that is local and convenient for her ?- BMP 01/17/21 reviewed and showed sodium 139, potassium 4.2, creatinine 1.27 and GFR 49 ? ?3: Tobacco use- ?- hasn't smoked in ~ 5 months ?- congratulated on that and continued cessation was encouraged ? ? ?Medication bottles reviewed.  ? ?Return in 6 months, sooner if needed.  ? ? ? ? ? ? ?

## 2021-06-23 ENCOUNTER — Encounter: Payer: Self-pay | Admitting: Family

## 2021-06-23 ENCOUNTER — Other Ambulatory Visit: Payer: Self-pay

## 2021-06-23 ENCOUNTER — Other Ambulatory Visit
Admission: RE | Admit: 2021-06-23 | Discharge: 2021-06-23 | Disposition: A | Discharge status: Home / Self Care | Payer: Medicaid Other | Source: Ambulatory Visit | Attending: Family | Admitting: Family

## 2021-06-23 ENCOUNTER — Ambulatory Visit: Payer: Medicaid Other | Attending: Family | Admitting: Family

## 2021-06-23 VITALS — BP 123/55 | HR 51 | Resp 14 | Ht 61.0 in | Wt 197.4 lb

## 2021-06-23 DIAGNOSIS — I5032 Chronic diastolic (congestive) heart failure: Secondary | ICD-10-CM | POA: Insufficient documentation

## 2021-06-23 DIAGNOSIS — Z8249 Family history of ischemic heart disease and other diseases of the circulatory system: Secondary | ICD-10-CM | POA: Insufficient documentation

## 2021-06-23 DIAGNOSIS — Z7984 Long term (current) use of oral hypoglycemic drugs: Secondary | ICD-10-CM | POA: Insufficient documentation

## 2021-06-23 DIAGNOSIS — I10 Essential (primary) hypertension: Secondary | ICD-10-CM

## 2021-06-23 DIAGNOSIS — Z720 Tobacco use: Secondary | ICD-10-CM

## 2021-06-23 DIAGNOSIS — I2510 Atherosclerotic heart disease of native coronary artery without angina pectoris: Secondary | ICD-10-CM | POA: Insufficient documentation

## 2021-06-23 DIAGNOSIS — I110 Hypertensive heart disease with heart failure: Secondary | ICD-10-CM | POA: Insufficient documentation

## 2021-06-23 DIAGNOSIS — Z79899 Other long term (current) drug therapy: Secondary | ICD-10-CM | POA: Insufficient documentation

## 2021-06-23 DIAGNOSIS — Z87891 Personal history of nicotine dependence: Secondary | ICD-10-CM | POA: Insufficient documentation

## 2021-06-23 LAB — BASIC METABOLIC PANEL
Anion gap: 10 (ref 5–15)
BUN: 27 mg/dL — ABNORMAL HIGH (ref 6–20)
CO2: 25 mmol/L (ref 22–32)
Calcium: 9.5 mg/dL (ref 8.9–10.3)
Chloride: 102 mmol/L (ref 98–111)
Creatinine, Ser: 1.64 mg/dL — ABNORMAL HIGH (ref 0.44–1.00)
GFR, Estimated: 36 mL/min — ABNORMAL LOW (ref >60)
Glucose, Bld: 174 mg/dL — ABNORMAL HIGH (ref 70–99)
Potassium: 4.1 mmol/L (ref 3.5–5.1)
Sodium: 137 mmol/L (ref 135–145)

## 2021-06-23 NOTE — Patient Instructions (Signed)
Continue weighing daily and call for an overnight weight gain of 3 pounds or more or a weekly weight gain of more than 5 pounds.   If you have voicemail, please make sure your mailbox is cleaned out so that we may leave a message and please make sure to listen to any voicemails.     

## 2021-06-24 ENCOUNTER — Other Ambulatory Visit: Payer: Self-pay | Admitting: Family

## 2021-06-24 ENCOUNTER — Telehealth: Payer: Self-pay

## 2021-06-24 NOTE — Telephone Encounter (Addendum)
Relayed lab results and message below. Patient acknowledged and stated she had no questions at this time. She states she can come for blood work at [facility] on Tuesday, April 4th at 9:30 AM. Clarisa Kindred, FNP, notified. ?Suanne Marker, RN ?----- Message from Delma Freeze, FNP sent at 06/24/2021  9:53 AM EDT ----- ?Potassium level is normal. Kidney function has declined some since starting jardiance which can be normal. Would like to recheck it in a couple of weeks though. Please let us know date/time that will work for you.  ?

## 2021-06-24 NOTE — Progress Notes (Signed)
She will come for lab work at [facility] on [address]. April 4th at 9:30 AM

## 2021-06-26 ENCOUNTER — Other Ambulatory Visit: Payer: Self-pay | Admitting: Family

## 2021-06-27 ENCOUNTER — Other Ambulatory Visit: Payer: Self-pay | Admitting: *Deleted

## 2021-06-27 MED ORDER — AMLODIPINE BESYLATE 5 MG PO TABS: 5.0000 mg | ORAL_TABLET | Freq: Every day | ORAL | 5 refills | Status: DC

## 2021-07-12 ENCOUNTER — Telehealth: Payer: Self-pay | Admitting: Family

## 2021-07-12 ENCOUNTER — Other Ambulatory Visit
Admission: RE | Admit: 2021-07-12 | Discharge: 2021-07-12 | Disposition: A | Discharge status: Home / Self Care | Payer: Medicaid Other | Source: Ambulatory Visit | Attending: Family | Admitting: Family

## 2021-07-12 DIAGNOSIS — I509 Heart failure, unspecified: Secondary | ICD-10-CM | POA: Insufficient documentation

## 2021-07-12 LAB — BASIC METABOLIC PANEL
Anion gap: 9 (ref 5–15)
BUN: 32 mg/dL — ABNORMAL HIGH (ref 6–20)
CO2: 22 mmol/L (ref 22–32)
Calcium: 9.1 mg/dL (ref 8.9–10.3)
Chloride: 106 mmol/L (ref 98–111)
Creatinine, Ser: 1.46 mg/dL — ABNORMAL HIGH (ref 0.44–1.00)
GFR, Estimated: 42 mL/min — ABNORMAL LOW (ref >60)
Glucose, Bld: 216 mg/dL — ABNORMAL HIGH (ref 70–99)
Potassium: 4 mmol/L (ref 3.5–5.1)
Sodium: 137 mmol/L (ref 135–145)

## 2021-07-12 NOTE — Telephone Encounter (Signed)
Spoke with patient regarding BMP results obtained earlier today. Renal function is improving.  ? ?Advised that glucose was elevated at 216 and she said that she had eaten cereal for breakfast. Advised to keep an eye on her glucose and follow-up with PCP regarding this. She verbalized understanding.  ?

## 2021-08-11 ENCOUNTER — Encounter: Payer: Self-pay | Admitting: Family

## 2021-08-11 ENCOUNTER — Telehealth: Payer: Self-pay | Admitting: Family

## 2021-08-12 ENCOUNTER — Telehealth: Payer: Self-pay | Admitting: Family

## 2021-08-12 NOTE — Telephone Encounter (Signed)
Notified patient that she has been approved for [place] patient assistance for free until 08/2022 and instructions on how to set up delivery. ? ? ?Luisalberto Beegle, NT ?

## 2021-08-31 ENCOUNTER — Other Ambulatory Visit: Payer: Self-pay

## 2021-08-31 MED ORDER — ENTRESTO 97-103 MG PO TABS: 1.0000 | ORAL_TABLET | Freq: Two times a day (BID) | ORAL | 0 refills | Status: DC

## 2021-10-03 ENCOUNTER — Other Ambulatory Visit: Payer: Self-pay | Admitting: *Deleted

## 2021-10-03 MED ORDER — ISOSORBIDE MONONITRATE ER 30 MG PO TB24: 30.0000 mg | ORAL_TABLET | Freq: Every day | ORAL | 0 refills | Status: DC

## 2021-10-03 NOTE — Telephone Encounter (Signed)
Requested Prescriptions   Signed Prescriptions Disp Refills   isosorbide mononitrate (IMDUR) 30 MG 24 hr tablet 30 tablet 0    Sig: Take 1 tablet (30 mg total) by mouth daily.    Authorizing Provider: Debbe Odea    Ordering User: Kendrick Fries

## 2021-10-17 ENCOUNTER — Other Ambulatory Visit: Payer: Self-pay

## 2021-10-17 MED ORDER — ISOSORBIDE MONONITRATE ER 30 MG PO TB24: 30.0000 mg | ORAL_TABLET | Freq: Every day | ORAL | 0 refills | Status: DC

## 2021-11-15 ENCOUNTER — Telehealth: Payer: Self-pay

## 2021-11-15 NOTE — Telephone Encounter (Signed)
Notified patient that [employer] Patient assistance program for Sherryll Burger is ending on 12/29/21 and we will need her to come by the HF Clinic to fill out re-enrollment application and bring proof of income. Patient has appointment with [place] on 12/26/21 and she states she has enough medication left to fill out application when she is here for that appointment on 9/18.  Suanne Marker, RN

## 2021-12-05 ENCOUNTER — Ambulatory Visit: Payer: Self-pay | Admitting: Cardiology

## 2021-12-05 ENCOUNTER — Encounter: Payer: Self-pay | Admitting: Cardiology

## 2021-12-23 NOTE — Progress Notes (Unsigned)
Patient ID: Sharon Delgado, female    DOB: 13-Jul-1965, 56 y.o.   MRN: [id number]  HPI  Sharon Delgado is a 56 y/o female with a history of HTN, recent tobacco use and heart failure.   Echo report from 05/03/21 reviewed and showed an EF of 60-65%. Echo report from 12/07/20 reviewed and showed an EF of 35-40% along with mild MR.   LHC done 12/10/20 and showed: Prox RCA to Mid RCA lesion is 40% stenosed.   Mid RCA to Dist RCA lesion is 30% stenosed.   Prox Cx to Mid Cx lesion is 40% stenosed.   1.  Mild to moderate nonobstructive coronary artery disease 2.  Left ventricular angiography was not performed.  EF was moderately reduced by echo. 3.  High normal left ventricular end-diastolic pressure of 12 mmHg.  Has not been admitted or been in the ED in the last 6 months.   She presents today for a follow-up visit with a chief complaint of minimal shortness of breath. She describes this as chronic in nature. She denies any difficulty sleeping, dizziness, fatigue, cough, chest pain/pressure, nor lower extremity edema, abdominal distention.   Weight is up and down. Monitors weight at home weighing 3-4 times per week. Follows a low-sodium diet and mostly cooks at home.   No GI issues with Jardiance.   Past Medical History:  Diagnosis Date   CHF (congestive heart failure) (HCC)    Hypertension     Past Surgical History:  Procedure Laterality Date   LEFT HEART CATH AND CORONARY ANGIOGRAPHY N/A 12/10/2020   Procedure: LEFT HEART CATH AND CORONARY ANGIOGRAPHY;  Surgeon: Wellington Hampshire, MD;  Location: [facility];  Service: Cardiovascular;  Laterality: N/A;   NO PAST SURGERIES     Family History  Problem Relation Age of Onset   Diabetes Mother    Hypertension Mother    COPD Father    Alcoholism Father    Social History   Tobacco Use   Smoking status: Former    Types: Cigarettes, Cigars    Quit date: 12/06/2020    Years since quitting: 1.0   Smokeless tobacco: Never  Substance  Use Topics   Alcohol use: Not Currently    Alcohol/week: 15.0 standard drinks of alcohol    Types: 15 Cans of beer per week   No Known Allergies  Prior to Admission medications   Medication Sig Start Date End Date Taking? Authorizing Provider  acetaminophen (TYLENOL) 500 MG tablet Take 500-1,000 mg by mouth every 6 (six) hours as needed for mild pain or moderate pain.   Yes [provider]  aspirin EC 81 MG tablet Take 81 mg by mouth daily.   Yes [provider]  empagliflozin (JARDIANCE) 10 MG TABS tablet Take 1 tablet (10 mg total) by mouth daily before breakfast. 05/25/21  Yes Darylene Price A, FNP  furosemide (LASIX) 40 MG tablet Take 1 tablet (40 mg total) by mouth daily as needed. For swelling. 05/09/21  Yes Agbor-Etang, Aaron Edelman, MD  amLODipine (NORVASC) 5 MG tablet Take 1 tablet (5 mg total) by mouth daily. 12/26/21   Alisa Graff, FNP  atorvastatin (LIPITOR) 40 MG tablet Take 1 tablet (40 mg total) by mouth daily. 12/26/21   Alisa Graff, FNP  carvedilol (COREG) 25 MG tablet Take 1 tablet (25 mg total) by mouth 2 (two) times daily with a meal. 12/26/21   Alisa Graff, FNP  isosorbide mononitrate (IMDUR) 30 MG 24 hr tablet  Take 1 tablet (30 mg total) by mouth daily. Please keep appointment on 12/05/2021 for future refills. 12/26/21   Delma Freeze, FNP  sacubitril-valsartan (ENTRESTO) 97-103 MG Take 1 tablet by mouth 2 (two) times daily. 12/26/21   Delma Freeze, FNP  spironolactone (ALDACTONE) 25 MG tablet Take 1 tablet (25 mg total) by mouth daily. 12/26/21   Delma Freeze, FNP    Review of Systems  Constitutional:  Negative for appetite change and fatigue.  HENT:  Negative for congestion, postnasal drip and sore throat.   Eyes: Negative.   Respiratory:  Positive for shortness of breath. Negative for cough, chest tightness and wheezing.   Cardiovascular:  Negative for chest pain, palpitations and leg swelling.  Gastrointestinal:  Negative for abdominal  distention and abdominal pain.  Endocrine: Negative.   Genitourinary: Negative.   Musculoskeletal:  Negative for back pain and neck pain.  Skin: Negative.   Allergic/Immunologic: Negative.   Neurological:  Negative for dizziness and light-headedness.  Hematological:  Negative for adenopathy. Does not bruise/bleed easily.  Psychiatric/Behavioral:  Negative for dysphoric mood and sleep disturbance (sleeping on 1 pillow). The patient is not nervous/anxious.    Vitals:   12/26/21 0954  BP: (!) 183/83  Pulse: 60  Resp: 18  SpO2: 99%   Wt Readings from Last 3 Encounters:  12/26/21 209 lb 8 oz (95 kg)  06/23/21 197 lb 6 oz (89.5 kg)  05/25/21 197 lb (89.4 kg)    Lab Results  Component Value Date   CREATININE 1.46 (H) 07/12/2021   CREATININE 1.64 (H) 06/23/2021   CREATININE 1.27 (H) 01/17/2021    Physical Exam Vitals and nursing note reviewed.  Constitutional:      Appearance: Normal appearance.  HENT:     Head: Normocephalic and atraumatic.  Cardiovascular:     Rate and Rhythm: Normal rate and regular rhythm.  Pulmonary:     Effort: Pulmonary effort is normal. No respiratory distress.     Breath sounds: Examination of the right-upper field reveals wheezing. Wheezing present. No rales.  Abdominal:     General: There is no distension.     Palpations: Abdomen is soft.     Tenderness: There is no abdominal tenderness.  Musculoskeletal:     Cervical back: Normal range of motion and neck supple.     Right lower leg: No edema.     Left lower leg: No edema.  Skin:    General: Skin is warm and dry.  Neurological:     General: No focal deficit present.     Mental Status: She is alert and oriented to person, place, and time.  Psychiatric:        Mood and Affect: Mood normal.        Behavior: Behavior normal.        Thought Content: Thought content normal.    Assessment & Plan:  1: Chronic heart failure with preserved ejection fraction without structural changes- - NYHA  class II - euvolemic today - weighing daily; reminded to call for an overnight weight gain of > 2 pounds or a weekly weight gain of > 5 pounds - weight up 12 pounds since last here 6 months ago - not adding salt and is looking at food labels although admits that eating low sodium foods remains a challenge - on GDMT of carvedilol, entresto, jardiance and spironolactone - saw cardiology (Agbor-Etang) 05/09/21 - taking furosemide PRN  - BNP 12/07/20 was 952.8  2: HTN-  - BP  (  183/83) not taken meds today - needs to get established with PCP; living in [facility] so advised to get PCP that is local and convenient for her - BMP 07/12/21 reviewed and showed sodium 137, potassium 4.0, creatinine 1.46 and GFR 42 - Ordered BMP today  3: Tobacco use- - hasn't smoked in 1 year  - congratulated on that and continued cessation was encouraged   Medication bottles reviewed.   Return in 2 months, sooner if needed.

## 2021-12-26 ENCOUNTER — Ambulatory Visit (HOSPITAL_BASED_OUTPATIENT_CLINIC_OR_DEPARTMENT_OTHER): Payer: Medicaid Other | Admitting: Family

## 2021-12-26 ENCOUNTER — Encounter
Admission: RE | Admit: 2021-12-26 | Discharge: 2021-12-26 | Disposition: A | Discharge status: Home / Self Care | Payer: Medicaid Other | Source: Ambulatory Visit | Attending: Family | Admitting: Family

## 2021-12-26 ENCOUNTER — Telehealth: Payer: Self-pay | Admitting: Family

## 2021-12-26 ENCOUNTER — Encounter: Payer: Self-pay | Admitting: Family

## 2021-12-26 VITALS — BP 183/83 | HR 60 | Resp 18 | Ht 61.0 in | Wt 209.5 lb

## 2021-12-26 DIAGNOSIS — R0602 Shortness of breath: Secondary | ICD-10-CM | POA: Insufficient documentation

## 2021-12-26 DIAGNOSIS — Z720 Tobacco use: Secondary | ICD-10-CM

## 2021-12-26 DIAGNOSIS — I2510 Atherosclerotic heart disease of native coronary artery without angina pectoris: Secondary | ICD-10-CM | POA: Insufficient documentation

## 2021-12-26 DIAGNOSIS — I5032 Chronic diastolic (congestive) heart failure: Secondary | ICD-10-CM | POA: Insufficient documentation

## 2021-12-26 DIAGNOSIS — I10 Essential (primary) hypertension: Secondary | ICD-10-CM

## 2021-12-26 DIAGNOSIS — I110 Hypertensive heart disease with heart failure: Secondary | ICD-10-CM | POA: Insufficient documentation

## 2021-12-26 DIAGNOSIS — Z01812 Encounter for preprocedural laboratory examination: Secondary | ICD-10-CM | POA: Insufficient documentation

## 2021-12-26 DIAGNOSIS — Z87891 Personal history of nicotine dependence: Secondary | ICD-10-CM | POA: Insufficient documentation

## 2021-12-26 LAB — BASIC METABOLIC PANEL
Anion gap: 7 (ref 5–15)
BUN: 37 mg/dL — ABNORMAL HIGH (ref 6–20)
CO2: 25 mmol/L (ref 22–32)
Calcium: 9.1 mg/dL (ref 8.9–10.3)
Chloride: 105 mmol/L (ref 98–111)
Creatinine, Ser: 1.42 mg/dL — ABNORMAL HIGH (ref 0.44–1.00)
GFR, Estimated: 43 mL/min — ABNORMAL LOW (ref >60)
Glucose, Bld: 305 mg/dL — ABNORMAL HIGH (ref 70–99)
Potassium: 4 mmol/L (ref 3.5–5.1)
Sodium: 137 mmol/L (ref 135–145)

## 2021-12-26 MED ORDER — CARVEDILOL 25 MG PO TABS: 25.0000 mg | ORAL_TABLET | Freq: Two times a day (BID) | ORAL | 3 refills | Status: DC

## 2021-12-26 MED ORDER — AMLODIPINE BESYLATE 5 MG PO TABS: 5.0000 mg | ORAL_TABLET | Freq: Every day | ORAL | 3 refills | Status: DC

## 2021-12-26 MED ORDER — SPIRONOLACTONE 25 MG PO TABS: 25.0000 mg | ORAL_TABLET | Freq: Every day | ORAL | 3 refills | Status: DC

## 2021-12-26 MED ORDER — ATORVASTATIN CALCIUM 40 MG PO TABS: 40.0000 mg | ORAL_TABLET | Freq: Every day | ORAL | 3 refills | Status: DC

## 2021-12-26 MED ORDER — ENTRESTO 97-103 MG PO TABS: 1.0000 | ORAL_TABLET | Freq: Two times a day (BID) | ORAL | 3 refills | Status: DC

## 2021-12-26 MED ORDER — ISOSORBIDE MONONITRATE ER 30 MG PO TB24: 30.0000 mg | ORAL_TABLET | Freq: Every day | ORAL | 3 refills | Status: DC

## 2021-12-26 NOTE — Patient Instructions (Signed)
Continue weighing daily and call for an overnight weight gain of 3 pounds or more or a weekly weight gain of more than 5 pounds.  °

## 2021-12-26 NOTE — Telephone Encounter (Signed)
Spoke with patient regarding BMP results obtained earlier today.  Potassium and sodium are normal and renal function in her normal range. Glucose is high at 305. She says that she hadn't eaten anything yet today but did have coffee with sugar and creamer in it. Doesn't have a glucometer.   Advised to f/u with PCP to keep an eye on her glucose levels and she said that she would.

## 2022-01-15 IMAGING — DX DG CHEST 1V PORT
1 series · 1 of 1 positions shown · non-contrast
Comparison: None.

CLINICAL DATA: sob

EXAM:
PORTABLE CHEST 1 VIEW

[chest ap]
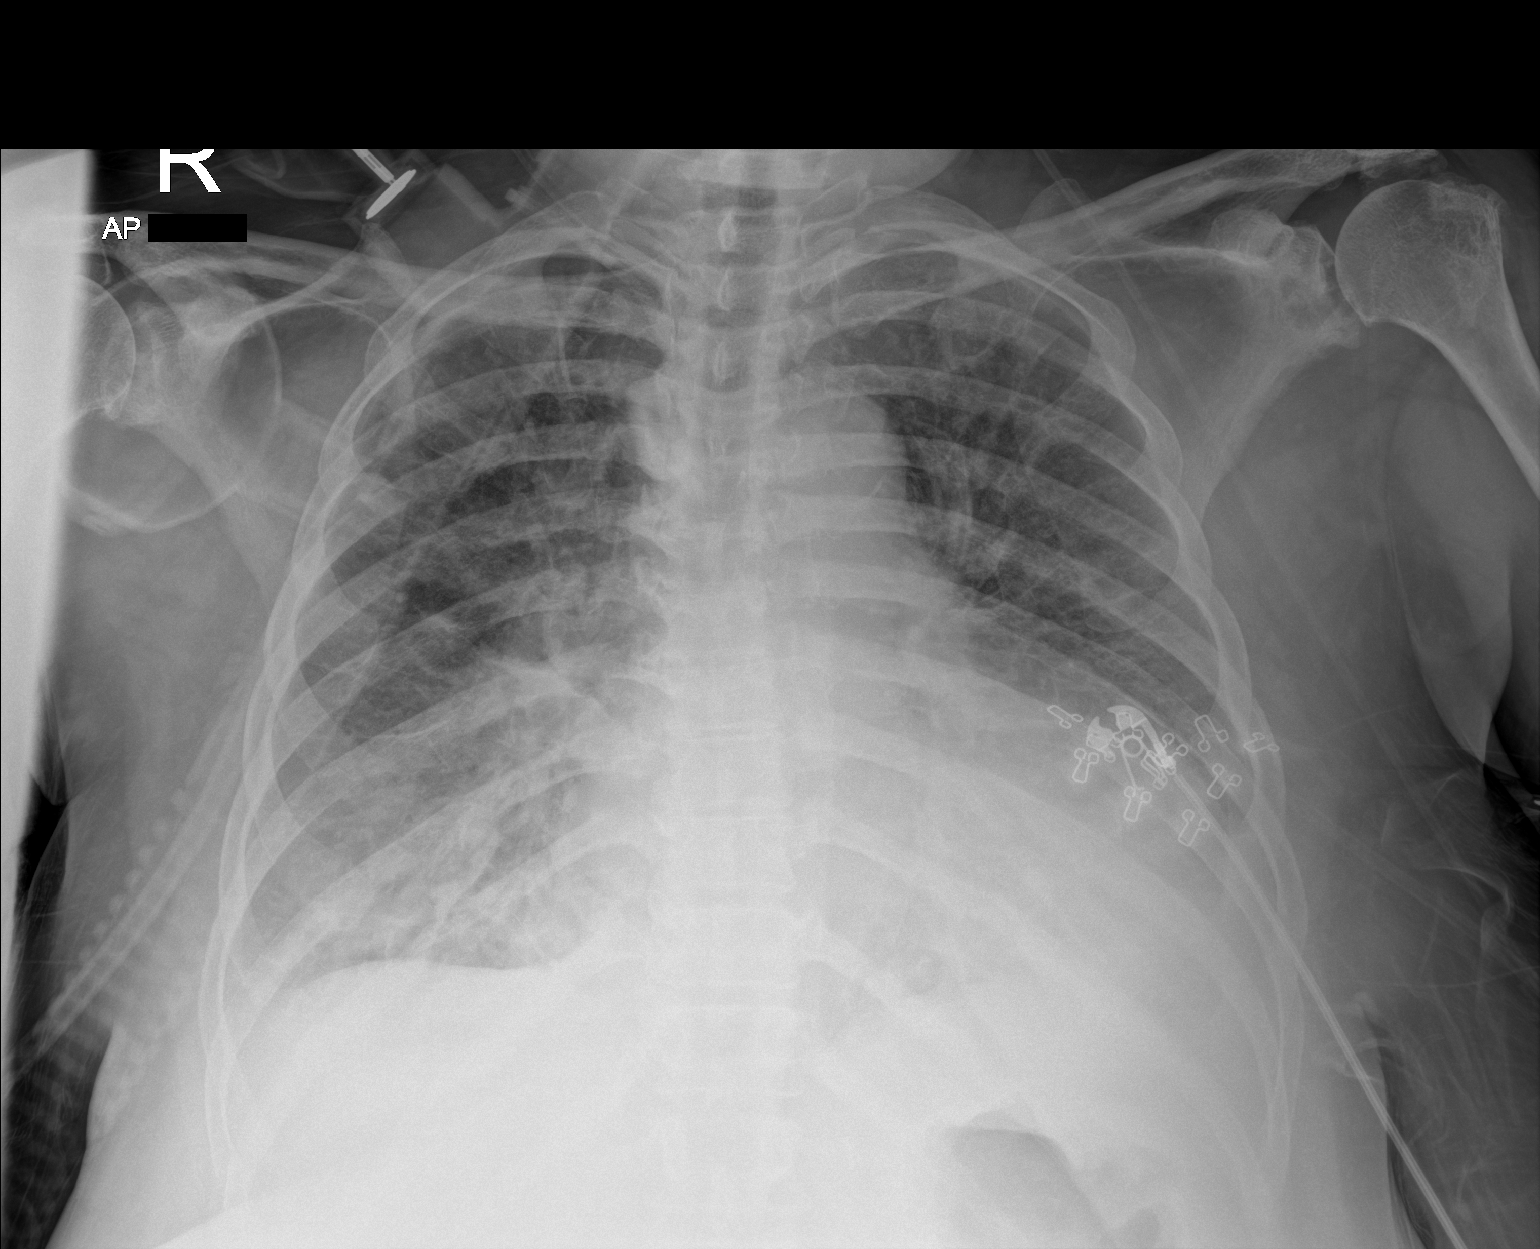

[1 of 1 positions shown; findings below may reference images not displayed]

FINDINGS: The heart appears enlarged. No definite large pleural effusion. No
pneumothorax. Increased interstitial opacities. No lobar
consolidation. No acute osseous abnormality.
IMPRESSION: The heart appears enlarged with increased interstitial opacities
suggestive of pulmonary edema.

## 2022-02-15 ENCOUNTER — Other Ambulatory Visit
Admission: RE | Admit: 2022-02-15 | Discharge: 2022-02-15 | Disposition: A | Discharge status: Home / Self Care | Payer: Medicaid Other | Source: Ambulatory Visit | Attending: Family | Admitting: Family

## 2022-02-15 ENCOUNTER — Telehealth: Payer: Self-pay | Admitting: Family

## 2022-02-15 ENCOUNTER — Encounter: Payer: Self-pay | Admitting: Family

## 2022-02-15 ENCOUNTER — Encounter: Payer: Self-pay | Admitting: Pharmacist

## 2022-02-15 ENCOUNTER — Ambulatory Visit: Payer: Medicaid Other | Attending: Family | Admitting: Family

## 2022-02-15 VITALS — BP 198/104 | HR 70 | Resp 18 | Ht 61.0 in | Wt 207.4 lb

## 2022-02-15 DIAGNOSIS — I10 Essential (primary) hypertension: Secondary | ICD-10-CM

## 2022-02-15 DIAGNOSIS — I5032 Chronic diastolic (congestive) heart failure: Secondary | ICD-10-CM | POA: Insufficient documentation

## 2022-02-15 DIAGNOSIS — Z720 Tobacco use: Secondary | ICD-10-CM

## 2022-02-15 LAB — BASIC METABOLIC PANEL
Anion gap: 9 (ref 5–15)
BUN: 25 mg/dL — ABNORMAL HIGH (ref 6–20)
CO2: 28 mmol/L (ref 22–32)
Calcium: 9.6 mg/dL (ref 8.9–10.3)
Chloride: 102 mmol/L (ref 98–111)
Creatinine, Ser: 1.4 mg/dL — ABNORMAL HIGH (ref 0.44–1.00)
GFR, Estimated: 44 mL/min — ABNORMAL LOW (ref >60)
Glucose, Bld: 322 mg/dL — ABNORMAL HIGH (ref 70–99)
Potassium: 3.8 mmol/L (ref 3.5–5.1)
Sodium: 139 mmol/L (ref 135–145)

## 2022-02-15 NOTE — Telephone Encounter (Signed)
Called to find out if patient has heard anything back or received anymore medications from [employer] for her entresto as we attempted to re certify with no response from [employer] on 12/26/21 and patient has not received medication or heard anything. I told her I would follow up with them and get back with her.   Kyeshia Zinn, NT

## 2022-02-15 NOTE — Telephone Encounter (Signed)
Spoke with [employer] and they stated her application was cancelled on 01/29/2022 as patient was not using services and last shipment was sent to patient back in may 2023. They reviewed the new application that we had sent on her behalf and they are still missing proof of income for entire household.   Lurie Mullane, NT

## 2022-02-15 NOTE — Progress Notes (Unsigned)
Patient ID: Sharon Delgado, female   DOB: [birth year], 56 y.o.   MRN: [id number]  [facility] - HEART FAILURE CLINIC - PHARMACIST COUNSELING NOTE  Guideline-Directed Medical Therapy/Evidence Based Medicine  *HFimpEF*  ACE/ARB/ARNI: Sacubitril-valsartan 97-103 mg twice daily Beta Blocker: Carvedilol 25 mg twice daily Aldosterone Antagonist: Spironolactone 25 mg daily Diuretic: Furosemide 40 mg daily SGLT2i: Empagliflozin 10 mg daily  Adherence Assessment  Do you ever forget to take your medication? [x] Yes [] No  Do you ever skip doses due to side effects? [] Yes [x] No  Do you have trouble affording your medicines? [] Yes [x] No  Are you ever unable to pick up your medication due to transportation difficulties? [] Yes [x] No  Do you ever stop taking your medications because you don't believe they are helping? [] Yes [x] No  Do you check your weight daily? [] Yes [x] No   Adherence strategy: none  Barriers to obtaining medications: none patient assistance)  Vital signs: HR 70, BP 198/104, weight (pounds) 207 lbs ECHO: Date 05/03/21, EF 60-65% Date 12/07/20, EF 35-40%, mild MR     Latest Ref Rng & Units 12/26/2021   10:44 AM 07/12/2021   10:31 AM 06/23/2021    1:48 PM  BMP  Glucose 70 - 99 mg/dL     BUN 6 - 20 mg/dL 37  32  27   Creatinine 0.44 - 1.00 mg/dL     Sodium 135 - 145 mmol/L 137  137  137   Potassium 3.5 - 5.1 mmol/L 4.0  4.0  4.1   Chloride 98 - 111 mmol/L 105  106  102   CO2 22 - 32 mmol/L 25  22  25    Calcium 8.9 - 10.3 mg/dL 9.1  9.1  9.5     Past Medical History:  Diagnosis Date   CHF (congestive heart failure) (HCC)    Hypertension     ASSESSMENT 56 year old female who presents to the HF clinic for follow up. PMH includes   No medication this morning and no carvedilol or Entresto yesterday evening.  PLAN  Increase amlodipine to 10mg  daily Resume all other mediation as previously prescribed Repeat  BMP today BP log for next OV. Bring home BP machine to determine if accurate with proper BP check technique.  Time spent: 20 minutes  Junie Avilla Rodriguez-Guzman PharmD, BCPS 02/15/2022 11:23 AM   Current Outpatient Medications:    amLODipine (NORVASC) 5 MG tablet, Take 1 tablet (5 mg total) by mouth daily., Disp: 90 tablet, Rfl: 3   aspirin EC 81 MG tablet, Take 81 mg by mouth daily., Disp: , Rfl:    atorvastatin (LIPITOR) 40 MG tablet, Take 1 tablet (40 mg total) by mouth daily., Disp: 90 tablet, Rfl: 3   carvedilol (COREG) 25 MG tablet, Take 1 tablet (25 mg total) by mouth 2 (two) times daily with a meal., Disp: 180 tablet, Rfl: 3   empagliflozin (JARDIANCE) 10 MG TABS tablet, Take 1 tablet (10 mg total) by mouth daily before breakfast., Disp: 30 tablet, Rfl: 5   furosemide (LASIX) 40 MG tablet, Take 1 tablet (40 mg total) by mouth daily as needed. For swelling., Disp: 30 tablet, Rfl: 5   isosorbide mononitrate (IMDUR) 30 MG 24 hr tablet, Take 1 tablet (30 mg total) by mouth daily. Please keep appointment on 12/05/2021 for future refills., Disp: 90 tablet, Rfl: 3   sacubitril-valsartan (ENTRESTO) 97-103 MG, Take 1 tablet by mouth 2 (two) times daily., Disp: 180 tablet, Rfl: 3  spironolactone (ALDACTONE) 25 MG tablet, Take 1 tablet (25 mg total) by mouth daily., Disp: 90 tablet, Rfl: 3    MEDICATION ADHERENCES TIPS AND STRATEGIES Taking medication as prescribed improves patient outcomes in heart failure (reduces hospitalizations, improves symptoms, increases survival) Side effects of medications can be managed by decreasing doses, switching agents, stopping drugs, or adding additional therapy. Please let someone in the Heart Failure Clinic know if you have having bothersome side effects so we can modify your regimen. Do not alter your medication regimen without talking to [place].  Medication reminders can help patients remember to take drugs on time. If you are missing or forgetting doses you can  try linking behaviors, using pill boxes, or an electronic reminder like an alarm on your phone or an app. Some people can also get automated phone calls as medication reminders.

## 2022-02-15 NOTE — Telephone Encounter (Signed)
Error

## 2022-02-15 NOTE — Patient Instructions (Addendum)
Continue weighing daily and call for an overnight weight gain of 3 pounds or more or a weekly weight gain of more than 5 pounds.   If you have voicemail, please make sure your mailbox is cleaned out so that we may leave a message and please make sure to listen to any voicemails.   Bring your blood pressure machine to your next appointment in 2 weeks and bring all your medications with you   Medication Changes:  Double your current amlodipine by taking 2 tablets daily. Total will be 10mg  daily  Lab Work: Labs done today, your results will be available in MyChart, we will contact you for abnormal readings.   Testing/Procedures:  None  Referrals:  None  Special Instructions // Education:  Do the following things EVERYDAY: Weigh yourself in the morning before breakfast. Write it down and keep it in a log. Take your medicines as prescribed Eat low salt foods--Limit salt (sodium) to 2000 mg per day.  Stay as active as you can everyday Limit all fluids for the day to less than 2 liters   Follow-Up in:   2 weeks    If you have any questions or concerns before your next appointment please send a message through [facility] or call our office at [phone]

## 2022-02-15 NOTE — Progress Notes (Signed)
Patient ID: Sharon Delgado, female    DOB: Jun 27, 1965, 56 y.o.   MRN: [id number]  HPI  Ms Breese is a 56 y/o female with a history of HTN, recent tobacco use and heart failure.   Echo report from 05/03/21 reviewed and showed an EF of 60-65%. Echo report from 12/07/20 reviewed and showed an EF of 35-40% along with mild MR.   LHC done 12/10/20 and showed: Prox RCA to Mid RCA lesion is 40% stenosed.   Mid RCA to Dist RCA lesion is 30% stenosed.   Prox Cx to Mid Cx lesion is 40% stenosed.   1.  Mild to moderate nonobstructive coronary artery disease 2.  Left ventricular angiography was not performed.  EF was moderately reduced by echo. 3.  High normal left ventricular end-diastolic pressure of 12 mmHg.  Has not been admitted or been in the ED in the last 6 months.   She presents today for a follow-up visit with a chief complaint of minimal fatigue with moderate exertion. Describes this as chronic in nature having been present for several years. She has associated shortness of breath (when climbing stairs) and light-headedness along with this. She denies any difficulty sleeping, abdominal distention, palpitations, pedal edema, chest pain, wheezing, cough or weight gain.   Did not take her entresto or carvedilol last night or any meds this morning. Says that she was rushing around this morning which is why she didn't take her meds this morning but she doesn't know what happened last night.   She says that she checks her BP at home and the top number is never higher than the 160's.   Past Medical History:  Diagnosis Date   CHF (congestive heart failure) (HCC)    Hypertension     Past Surgical History:  Procedure Laterality Date   LEFT HEART CATH AND CORONARY ANGIOGRAPHY N/A 12/10/2020   Procedure: LEFT HEART CATH AND CORONARY ANGIOGRAPHY;  Surgeon: Iran Ouch, MD;  Location: [facility] INV[facility]IVE CV [facility];  Service: Cardiovascular;  Laterality: N/A;   NO PAST SURGERIES     Family History   Problem Relation Age of Onset   Diabetes Mother    Hypertension Mother    COPD Father    Alcoholism Father    Social History   Tobacco Use   Smoking status: Former    Types: Cigarettes, Cigars    Quit date: 12/06/2020    Years since quitting: 1.1   Smokeless tobacco: Never  Substance Use Topics   Alcohol use: Not Currently    Alcohol/week: 15.0 standard drinks of alcohol    Types: 15 Cans of beer per week   No Known Allergies  Prior to Admission medications   Medication Sig Start Date End Date Taking? Authorizing Provider  amLODipine (NORVASC) 5 MG tablet Take 1 tablet (5 mg total) by mouth daily. 12/26/21  Yes Clarisa Kindred A, FNP  aspirin EC 81 MG tablet Take 81 mg by mouth daily.   Yes [provider]  atorvastatin (LIPITOR) 40 MG tablet Take 1 tablet (40 mg total) by mouth daily. 12/26/21  Yes Delma Freeze, FNP  carvedilol (COREG) 25 MG tablet Take 1 tablet (25 mg total) by mouth 2 (two) times daily with a meal. 12/26/21  Yes Clarisa Kindred A, FNP  empagliflozin (JARDIANCE) 10 MG TABS tablet Take 1 tablet (10 mg total) by mouth daily before breakfast. 05/25/21  Yes Clarisa Kindred A, FNP  furosemide (LASIX) 40 MG tablet Take 1 tablet (40 mg  total) by mouth daily as needed. For swelling. 05/09/21  Yes Debbe Odea, MD  isosorbide mononitrate (IMDUR) 30 MG 24 hr tablet Take 1 tablet (30 mg total) by mouth daily. Please keep appointment on 12/05/2021 for future refills. 12/26/21  Yes Taegen Lennox A, FNP  sacubitril-valsartan (ENTRESTO) 97-103 MG Take 1 tablet by mouth 2 (two) times daily. 12/26/21  Yes Clarisa Kindred A, FNP  spironolactone (ALDACTONE) 25 MG tablet Take 1 tablet (25 mg total) by mouth daily. 12/26/21  Yes Delma Freeze, FNP    Review of Systems  Constitutional:  Positive for fatigue. Negative for appetite change.  HENT:  Negative for congestion, postnasal drip and sore throat.   Eyes: Negative.   Respiratory:  Positive for shortness of breath (when  going up steps). Negative for cough, chest tightness and wheezing.   Cardiovascular:  Negative for chest pain, palpitations and leg swelling.  Gastrointestinal:  Negative for abdominal distention and abdominal pain.  Endocrine: Negative.   Genitourinary: Negative.   Musculoskeletal:  Negative for back pain and neck pain.  Skin: Negative.   Allergic/Immunologic: Negative.   Neurological:  Positive for light-headedness. Negative for dizziness.  Hematological:  Negative for adenopathy. Does not bruise/bleed easily.  Psychiatric/Behavioral:  Negative for dysphoric mood and sleep disturbance (sleeping on 1 pillow). The patient is not nervous/anxious.    Vitals:   02/15/22 0924 02/15/22 0938  BP: (!) 216/86 (!) 198/104  Pulse: 70   Resp: 18   SpO2: 99%    Wt Readings from Last 3 Encounters:  02/15/22 207 lb 6 oz (94.1 kg)  12/26/21 209 lb 8 oz (95 kg)  06/23/21 197 lb 6 oz (89.5 kg)    Lab Results  Component Value Date   CREATININE 1.42 (H) 12/26/2021   CREATININE 1.46 (H) 07/12/2021   CREATININE 1.64 (H) 06/23/2021    Physical Exam Vitals and nursing note reviewed.  Constitutional:      Appearance: Normal appearance.  HENT:     Head: Normocephalic and atraumatic.  Cardiovascular:     Rate and Rhythm: Normal rate and regular rhythm.  Pulmonary:     Effort: Pulmonary effort is normal. No respiratory distress.     Breath sounds: No wheezing or rales.  Abdominal:     General: There is no distension.     Palpations: Abdomen is soft.     Tenderness: There is no abdominal tenderness.  Musculoskeletal:        General: No tenderness.     Cervical back: Normal range of motion and neck supple.     Right lower leg: No edema.     Left lower leg: No edema.  Skin:    General: Skin is warm and dry.  Neurological:     General: No focal deficit present.     Mental Status: She is alert and oriented to person, place, and time.  Psychiatric:        Mood and Affect: Mood normal.         Behavior: Behavior normal.        Thought Content: Thought content normal.    Assessment & Plan:  1: Chronic heart failure with preserved ejection fraction without structural changes- - NYHA class II - euvolemic today - weighing daily; reminded to call for an overnight weight gain of > 2 pounds or a weekly weight gain of > 5 pounds - weight down 2 pounds since last here 2 months ago - not adding "much" salt and feels like she's doing  better with her salt usage; emphasized the importance of not adding any salt to her food - on GDMT of carvedilol, entresto, jardiance and spironolactone - saw cardiology (Agbor-Etang) 05/09/21 - taking furosemide PRN  - BNP 12/07/20 was 952.8 - PharmD reconciled medications with the patient  2: HTN-  - BP elevated even upon recheck - increase amlodipine to 10mg  daily; will need new RX for 10mg  tablet at next visit - she will take 2 of her 5mg  tablets daily until seen in 2 weeks - emphasized bringing her BP machine and her medications to her next appt so we can verify her BP monitor at home is correct; RN reviewed how t appropriately check her BP at home - needs to get established with PCP; living in [facility] so advised to get PCP that is local and convenient for her - BMP 07/12/21 reviewed and showed sodium 137, potassium 4.0, creatinine 1.46 and GFR 42 - check BMP today  3: Tobacco use- - hasn't smoked in 1 year  - congratulated on that and continued cessation was encouraged   Patient did not bring her medications nor a list. Each medication was verbally reviewed with the patient and she was encouraged to bring the bottles to every visit to confirm accuracy of list.  Return in 2 months, sooner if needed.

## 2022-03-01 ENCOUNTER — Ambulatory Visit: Payer: Medicaid Other | Attending: Family | Admitting: Family

## 2022-03-01 ENCOUNTER — Encounter: Payer: Self-pay | Admitting: Family

## 2022-03-01 VITALS — BP 190/90 | HR 71 | Resp 20 | Wt 209.0 lb

## 2022-03-01 DIAGNOSIS — I5032 Chronic diastolic (congestive) heart failure: Secondary | ICD-10-CM | POA: Insufficient documentation

## 2022-03-01 DIAGNOSIS — I110 Hypertensive heart disease with heart failure: Secondary | ICD-10-CM | POA: Insufficient documentation

## 2022-03-01 DIAGNOSIS — Z7984 Long term (current) use of oral hypoglycemic drugs: Secondary | ICD-10-CM | POA: Insufficient documentation

## 2022-03-01 DIAGNOSIS — Z720 Tobacco use: Secondary | ICD-10-CM

## 2022-03-01 DIAGNOSIS — Z87891 Personal history of nicotine dependence: Secondary | ICD-10-CM | POA: Insufficient documentation

## 2022-03-01 DIAGNOSIS — Z79899 Other long term (current) drug therapy: Secondary | ICD-10-CM | POA: Insufficient documentation

## 2022-03-01 DIAGNOSIS — R519 Headache, unspecified: Secondary | ICD-10-CM | POA: Insufficient documentation

## 2022-03-01 DIAGNOSIS — R0602 Shortness of breath: Secondary | ICD-10-CM | POA: Insufficient documentation

## 2022-03-01 DIAGNOSIS — I10 Essential (primary) hypertension: Secondary | ICD-10-CM

## 2022-03-01 DIAGNOSIS — R059 Cough, unspecified: Secondary | ICD-10-CM | POA: Insufficient documentation

## 2022-03-01 DIAGNOSIS — I2510 Atherosclerotic heart disease of native coronary artery without angina pectoris: Secondary | ICD-10-CM | POA: Insufficient documentation

## 2022-03-01 MED ORDER — AMLODIPINE BESYLATE 10 MG PO TABS: 10.0000 mg | ORAL_TABLET | Freq: Every day | ORAL | 5 refills | Status: DC

## 2022-03-01 MED ORDER — ENTRESTO 97-103 MG PO TABS: 1.0000 | ORAL_TABLET | Freq: Two times a day (BID) | ORAL | 3 refills | Status: DC

## 2022-03-01 NOTE — Progress Notes (Signed)
Patient ID: Sharon Delgado, female    DOB: [birth year], 57 y.o.   MRN: [id number]  HPI  Ms Ricci is a 56 y/o female with a history of HTN, recent tobacco use and heart failure.   Echo report from 05/03/21 reviewed and showed an EF of 60-65%. Echo report from 12/07/20 reviewed and showed an EF of 35-40% along with mild MR.   LHC done 12/10/20 and showed: Prox RCA to Mid RCA lesion is 40% stenosed.   Mid RCA to Dist RCA lesion is 30% stenosed.   Prox Cx to Mid Cx lesion is 40% stenosed.   1.  Mild to moderate nonobstructive coronary artery disease 2.  Left ventricular angiography was not performed.  EF was moderately reduced by echo. 3.  High normal left ventricular end-diastolic pressure of 12 mmHg.  Has not been admitted or been in the ED in the last 6 months.   She presents today for a follow-up visit with a chief complaint of minimal fatigue upon moderate exertion. Describes this as chronic in nature. She has associated cough, shortness of breath, headaches and slight weight gain along with this. She denies any dizziness, difficulty sleeping, abdominal distention, palpitations, pedal edema, chest pain or wheezing.   She says that she's been out of carvedilol, spironolactone, amlodipine and imdur for the last 2 weeks due to finances. Took her last dose of entresto this morning.   Getting jardiance through patient assistance and was previously getting entresto but it now has to be renewed and will need income information from patient.   Past Medical History:  Diagnosis Date   CHF (congestive heart failure) (HCC)    Hypertension     Past Surgical History:  Procedure Laterality Date   LEFT HEART CATH AND CORONARY ANGIOGRAPHY N/A 12/10/2020   Procedure: LEFT HEART CATH AND CORONARY ANGIOGRAPHY;  Surgeon: Iran Ouch, MD;  Location: [facility] INV[facility];  Service: Cardiovascular;  Laterality: N/A;   NO PAST SURGERIES     Family History  Problem Relation Age of Onset   Diabetes  Mother    Hypertension Mother    COPD Father    Alcoholism Father    Social History   Tobacco Use   Smoking status: Former    Types: Cigarettes, Cigars    Quit date: 12/06/2020    Years since quitting: 1.2   Smokeless tobacco: Never  Substance Use Topics   Alcohol use: Not Currently    Alcohol/week: 15.0 standard drinks of alcohol    Types: 15 Cans of beer per week   No Known Allergies  Prior to Admission medications   Medication Sig Start Date End Date Taking? Authorizing Provider  aspirin EC 81 MG tablet Take 81 mg by mouth daily.   Yes [provider]  atorvastatin (LIPITOR) 40 MG tablet Take 1 tablet (40 mg total) by mouth daily. 12/26/21  Yes Clarisa Kindred A, FNP  empagliflozin (JARDIANCE) 10 MG TABS tablet Take 1 tablet (10 mg total) by mouth daily before breakfast. 05/25/21  Yes Clarisa Kindred A, FNP  furosemide (LASIX) 40 MG tablet Take 1 tablet (40 mg total) by mouth daily as needed. For swelling. 05/09/21  Yes Agbor-Etang, Arlys John, MD  sacubitril-valsartan (ENTRESTO) 97-103 MG Take 1 tablet by mouth 2 (two) times daily. 12/26/21  Yes Arnitra Sokoloski, Inetta Fermo A, FNP  amLODipine (NORVASC) 5 MG tablet Take 1 tablet (5 mg total) by mouth daily. Patient not taking: Reported on 03/01/2022 12/26/21   Delma Freeze, FNP  carvedilol (COREG) 25 MG tablet Take 1 tablet (25 mg total) by mouth 2 (two) times daily with a meal. Patient not taking: Reported on 03/01/2022 12/26/21   Delma Freeze, FNP  isosorbide mononitrate (IMDUR) 30 MG 24 hr tablet Take 1 tablet (30 mg total) by mouth daily. Please keep appointment on 12/05/2021 for future refills. Patient not taking: Reported on 03/01/2022 12/26/21   Delma Freeze, FNP  spironolactone (ALDACTONE) 25 MG tablet Take 1 tablet (25 mg total) by mouth daily. Patient not taking: Reported on 03/01/2022 12/26/21   Delma Freeze, FNP   Review of Systems  Constitutional:  Positive for fatigue. Negative for appetite change.  HENT:  Negative for  congestion, postnasal drip and sore throat.   Eyes: Negative.   Respiratory:  Positive for cough and shortness of breath (when going up steps). Negative for chest tightness and wheezing.   Cardiovascular:  Negative for chest pain, palpitations and leg swelling.  Gastrointestinal:  Negative for abdominal distention and abdominal pain.  Endocrine: Negative.   Genitourinary: Negative.   Musculoskeletal:  Negative for back pain and neck pain.  Skin: Negative.   Allergic/Immunologic: Negative.   Neurological:  Positive for headaches. Negative for dizziness and light-headedness.  Hematological:  Negative for adenopathy. Does not bruise/bleed easily.  Psychiatric/Behavioral:  Negative for dysphoric mood and sleep disturbance (sleeping on 1 pillow). The patient is not nervous/anxious.    Vitals:   03/01/22 1521 03/01/22 1534  BP: (!) 220/99 (!) 190/90  Pulse: 71   Resp: 20   SpO2: 99%   Weight: 209 lb (94.8 kg)    Wt Readings from Last 3 Encounters:  03/01/22 209 lb (94.8 kg)  02/15/22 207 lb 6 oz (94.1 kg)  12/26/21 209 lb 8 oz (95 kg)   Lab Results  Component Value Date   CREATININE 1.40 (H) 02/15/2022   CREATININE 1.42 (H) 12/26/2021   CREATININE 1.46 (H) 07/12/2021      Physical Exam Vitals and nursing note reviewed.  Constitutional:      Appearance: Normal appearance.  HENT:     Head: Normocephalic and atraumatic.  Cardiovascular:     Rate and Rhythm: Normal rate and regular rhythm.  Pulmonary:     Effort: Pulmonary effort is normal. No respiratory distress.     Breath sounds: No wheezing or rales.  Abdominal:     General: There is no distension.     Palpations: Abdomen is soft.     Tenderness: There is no abdominal tenderness.  Musculoskeletal:        General: No tenderness.     Cervical back: Normal range of motion and neck supple.     Right lower leg: No edema.     Left lower leg: No edema.  Skin:    General: Skin is warm and dry.  Neurological:     General:  No focal deficit present.     Mental Status: She is alert and oriented to person, place, and time.  Psychiatric:        Mood and Affect: Mood normal.        Behavior: Behavior normal.        Thought Content: Thought content normal.    Assessment & Plan:  1: Chronic heart failure with preserved ejection fraction without structural changes- - NYHA class II - euvolemic today - not weighing daily; emphasized the importance of weighing daily so that she can call for an overnight weight gain of > 2 pounds or a weekly  weight gain of > 5 pounds - weight up 2 pounds since last here 2 weeks ago - not adding "much" salt and feels like she's doing better with her salt usage; emphasized the importance of not adding any salt to her food - on GDMT of carvedilol, entresto, jardiance and spironolactone although has been out of carvedilol and spironolactone due to finances - gift card provided today so that she can get her medications - 6 weeks entresto 49/51mg  provided and she will take 1 tablet BID - [employer] patient assistance forms filled out today for the 97/103mg  dose - saw cardiology (Agbor-Etang) 05/09/21 - taking furosemide PRN  - BNP 12/07/20 was 952.8  2: HTN-  - BP elevated but she's been out of numerous meds for the last 2 weeks; gift card given per above - emphasized, again, the importance to get established with PCP; living in [facility] so advised to get PCP that is local and convenient for her - BMP 02/15/22 reviewed and showed sodium 139, potassium 3.8, creatinine 1.4 and GFR 44  3: Tobacco use- - hasn't smoked in 1 year  - congratulated on that and continued cessation was encouraged   Medication bottles reviewed.   Return in 1 month, sooner if needed

## 2022-03-01 NOTE — Patient Instructions (Addendum)
Resume weighing daily and call for an overnight weight gain of 3 pounds or more or a weekly weight gain of more than 5 pounds.   If you have voicemail, please make sure your mailbox is cleaned out so that we may leave a message and please make sure to listen to any voicemails.     Take the gift card and pick up your spironolactone, carvedilol, amlodipine and isosorbide

## 2022-03-06 ENCOUNTER — Telehealth: Payer: Self-pay | Admitting: Family

## 2022-03-06 NOTE — Telephone Encounter (Signed)
Patient submitted new application with [employer] for entresto; pending approval.   Sharon Delgado, NT

## 2022-03-27 ENCOUNTER — Encounter: Payer: Self-pay | Admitting: Pharmacist

## 2022-03-27 ENCOUNTER — Encounter: Payer: Self-pay | Admitting: Family

## 2022-03-27 ENCOUNTER — Ambulatory Visit: Payer: Medicaid Other | Attending: Family | Admitting: Family

## 2022-03-27 VITALS — BP 104/63 | HR 62 | Resp 20 | Wt 197.1 lb

## 2022-03-27 DIAGNOSIS — I110 Hypertensive heart disease with heart failure: Secondary | ICD-10-CM | POA: Insufficient documentation

## 2022-03-27 DIAGNOSIS — Z720 Tobacco use: Secondary | ICD-10-CM

## 2022-03-27 DIAGNOSIS — R0602 Shortness of breath: Secondary | ICD-10-CM | POA: Insufficient documentation

## 2022-03-27 DIAGNOSIS — Z7984 Long term (current) use of oral hypoglycemic drugs: Secondary | ICD-10-CM | POA: Insufficient documentation

## 2022-03-27 DIAGNOSIS — Z87891 Personal history of nicotine dependence: Secondary | ICD-10-CM | POA: Insufficient documentation

## 2022-03-27 DIAGNOSIS — I5032 Chronic diastolic (congestive) heart failure: Secondary | ICD-10-CM | POA: Insufficient documentation

## 2022-03-27 DIAGNOSIS — I2510 Atherosclerotic heart disease of native coronary artery without angina pectoris: Secondary | ICD-10-CM | POA: Insufficient documentation

## 2022-03-27 DIAGNOSIS — R519 Headache, unspecified: Secondary | ICD-10-CM | POA: Insufficient documentation

## 2022-03-27 DIAGNOSIS — Z79899 Other long term (current) drug therapy: Secondary | ICD-10-CM | POA: Insufficient documentation

## 2022-03-27 DIAGNOSIS — R059 Cough, unspecified: Secondary | ICD-10-CM | POA: Insufficient documentation

## 2022-03-27 DIAGNOSIS — R5383 Other fatigue: Secondary | ICD-10-CM | POA: Insufficient documentation

## 2022-03-27 DIAGNOSIS — R42 Dizziness and giddiness: Secondary | ICD-10-CM | POA: Insufficient documentation

## 2022-03-27 DIAGNOSIS — I10 Essential (primary) hypertension: Secondary | ICD-10-CM

## 2022-03-27 LAB — BASIC METABOLIC PANEL
Anion gap: 10 (ref 5–15)
BUN: 35 mg/dL — ABNORMAL HIGH (ref 6–20)
CO2: 24 mmol/L (ref 22–32)
Calcium: 9.3 mg/dL (ref 8.9–10.3)
Chloride: 99 mmol/L (ref 98–111)
Creatinine, Ser: 1.65 mg/dL — ABNORMAL HIGH (ref 0.44–1.00)
GFR, Estimated: 36 mL/min — ABNORMAL LOW (ref >60)
Glucose, Bld: 498 mg/dL — ABNORMAL HIGH (ref 70–99)
Potassium: 4.2 mmol/L (ref 3.5–5.1)
Sodium: 133 mmol/L — ABNORMAL LOW (ref 135–145)

## 2022-03-27 LAB — HEMOGLOBIN A1C
Hgb A1c MFr Bld: 12.8 % — ABNORMAL HIGH (ref 4.8–5.6)
Mean Plasma Glucose: 321 mg/dL

## 2022-03-27 NOTE — Progress Notes (Signed)
Patient ID: Sharon Delgado, female    DOB: 1965-04-19, 56 y.o.   MRN: [id number]  HPI  Sharon Delgado is a 56 y/o female with a history of HTN, recent tobacco use and heart failure.   Echo report from 05/03/21 reviewed and showed an EF of 60-65%. Echo report from 12/07/20 reviewed and showed an EF of 35-40% along with mild MR.   LHC done 12/10/20 and showed: Prox RCA to Mid RCA lesion is 40% stenosed.   Mid RCA to Dist RCA lesion is 30% stenosed.   Prox Cx to Mid Cx lesion is 40% stenosed.   1.  Mild to moderate nonobstructive coronary artery disease 2.  Left ventricular angiography was not performed.  EF was moderately reduced by echo. 3.  High normal left ventricular end-diastolic pressure of 12 mmHg.  Has not been admitted or been in the ED in the last 6 months.   She presents today for a follow-up visit with a chief complaint of minimal fatigue with moderate exertion. Describes this as chronic. Has associated cough, shortness of breath, headaches, light-headedness and weight loss along with this. Denies difficulty sleeping, abdominal distention, palpitations, pedal edema, chest pain, wheezing or weight gain.   Now has all her medications and has taken them all today. Weighing daily and reports a gradual weight loss because she's trying to "eat right".   Has not taken any furosemide in the last 2 weeks.   Past Medical History:  Diagnosis Date   CHF (congestive heart failure) (HCC)    Hypertension     Past Surgical History:  Procedure Laterality Date   LEFT HEART CATH AND CORONARY ANGIOGRAPHY N/A 12/10/2020   Procedure: LEFT HEART CATH AND CORONARY ANGIOGRAPHY;  Surgeon: Iran Ouch, MD;  Location: [facility] INV[facility] CV [facility];  Service: Cardiovascular;  Laterality: N/A;   NO PAST SURGERIES     Family History  Problem Relation Age of Onset   Diabetes Mother    Hypertension Mother    COPD Father    Alcoholism Father    Social History   Tobacco Use   Smoking status: Former     Types: Cigarettes, Cigars    Quit date: 12/06/2020    Years since quitting: 1.3   Smokeless tobacco: Never  Substance Use Topics   Alcohol use: Not Currently    Alcohol/week: 15.0 standard drinks of alcohol    Types: 15 Cans of beer per week   No Known Allergies  Prior to Admission medications   Medication Sig Start Date End Date Taking? Authorizing Provider  amLODipine (NORVASC) 10 MG tablet Take 1 tablet (10 mg total) by mouth daily. 03/01/22  Yes Clarisa Kindred A, FNP  aspirin EC 81 MG tablet Take 81 mg by mouth daily.   Yes [provider]  atorvastatin (LIPITOR) 40 MG tablet Take 1 tablet (40 mg total) by mouth daily. 12/26/21  Yes Delma Freeze, FNP  carvedilol (COREG) 25 MG tablet Take 1 tablet (25 mg total) by mouth 2 (two) times daily with a meal. 12/26/21  Yes Clarisa Kindred A, FNP  empagliflozin (JARDIANCE) 10 MG TABS tablet Take 1 tablet (10 mg total) by mouth daily before breakfast. 05/25/21  Yes Clarisa Kindred A, FNP  furosemide (LASIX) 40 MG tablet Take 1 tablet (40 mg total) by mouth daily as needed. For swelling. 05/09/21  Yes Debbe Odea, MD  isosorbide mononitrate (IMDUR) 30 MG 24 hr tablet Take 1 tablet (30 mg total) by mouth daily. Please keep appointment  on 12/05/2021 for future refills. 12/26/21  Yes Olyvia Gopal A, FNP  sacubitril-valsartan (ENTRESTO) 97-103 MG Take 1 tablet by mouth 2 (two) times daily. 03/01/22  Yes Clarisa Kindred A, FNP  spironolactone (ALDACTONE) 25 MG tablet Take 1 tablet (25 mg total) by mouth daily. 12/26/21  Yes Delma Freeze, FNP    Review of Systems  Constitutional:  Positive for fatigue. Negative for appetite change.  HENT:  Negative for congestion, postnasal drip and sore throat.   Eyes: Negative.   Respiratory:  Positive for cough and shortness of breath (when going up steps). Negative for chest tightness and wheezing.   Cardiovascular:  Negative for chest pain, palpitations and leg swelling.  Gastrointestinal:  Negative for  abdominal distention and abdominal pain.  Endocrine: Negative.   Genitourinary: Negative.   Musculoskeletal:  Negative for back pain and neck pain.  Skin: Negative.   Allergic/Immunologic: Negative.   Neurological:  Positive for light-headedness (at times) and headaches. Negative for dizziness.  Hematological:  Negative for adenopathy. Does not bruise/bleed easily.  Psychiatric/Behavioral:  Negative for dysphoric mood and sleep disturbance (sleeping on 1 pillow). The patient is not nervous/anxious.    Vitals:   03/27/22 1016  BP: 104/63  Pulse: 62  Resp: 20  SpO2: 97%  Weight: 197 lb 2 oz (89.4 kg)   Wt Readings from Last 3 Encounters:  03/27/22 197 lb 2 oz (89.4 kg)  03/01/22 209 lb (94.8 kg)  02/15/22 207 lb 6 oz (94.1 kg)   Lab Results  Component Value Date   CREATININE 1.40 (H) 02/15/2022   CREATININE 1.42 (H) 12/26/2021   CREATININE 1.46 (H) 07/12/2021   Physical Exam Vitals and nursing note reviewed.  Constitutional:      Appearance: Normal appearance.  HENT:     Head: Normocephalic and atraumatic.  Cardiovascular:     Rate and Rhythm: Normal rate and regular rhythm.  Pulmonary:     Effort: Pulmonary effort is normal. No respiratory distress.     Breath sounds: No wheezing or rales.  Abdominal:     General: There is no distension.     Palpations: Abdomen is soft.     Tenderness: There is no abdominal tenderness.  Musculoskeletal:        General: No tenderness.     Cervical back: Normal range of motion and neck supple.     Right lower leg: No edema.     Left lower leg: No edema.  Skin:    General: Skin is warm and dry.  Neurological:     General: No focal deficit present.     Mental Status: She is alert and oriented to person, place, and time.  Psychiatric:        Mood and Affect: Mood normal.        Behavior: Behavior normal.        Thought Content: Thought content normal.    Assessment & Plan:  1: Chronic heart failure with preserved ejection  fraction without structural changes- - NYHA class II - euvolemic today - weighing daily; reminded to call for an overnight weight gain of > 2 pounds or a weekly weight gain of > 5 pounds - weight down 12 pounds since last here 1 month ago; she is trying to eat more healthy and eat less - not adding "much" salt and feels like she's doing better with her salt usage; emphasized the importance of not adding any salt to her food - on GDMT of carvedilol, entresto, jardiance and  spironolactone  - check BMP today since she's back on all her medications; will also check A1c as it hasn't been checked in 8 years - entresto samples of 49/51mg  provided today - saw cardiology (Agbor-Etang) 05/09/21 - taking furosemide PRN & last took it ~ 2 weeks ago - BNP 12/07/20 was 952.8 - PharmD reconciled medications with patient  2: HTN-  - BP much improved (104/63) - emphasized, again, the importance to get established with PCP; living in [facility] so advised to get PCP that is local and convenient for her - BMP 02/15/22 reviewed and showed sodium 139, potassium 3.8, creatinine 1.4 and GFR 44  3: Tobacco use- - hasn't smoked in 1 year  - congratulated on that and continued cessation was encouraged   Medication bottles reviewed.   Return in 1 month, sooner if needed.

## 2022-03-27 NOTE — Patient Instructions (Addendum)
Continue weighing daily and call for an overnight weight gain of 3 pounds or more or a weekly weight gain of more than 5 pounds.   If you have voicemail, please make sure your mailbox is cleaned out so that we may leave a message and please make sure to listen to any voicemails.    If you receive a satisfaction survey regarding the [facility], please take the time to fill it out. This way we can continue to provide excellent care and make any changes that need to be made.     Please get a primary care doctor close to where you live.

## 2022-03-27 NOTE — Progress Notes (Signed)
Patient ID: Sharon Delgado, female   DOB: Nov 09, 1965, 56 y.o.   MRN: [id number]  [facility] - HE[facility] FAILURE CLINIC - PHARMACIST COUNSELING NOTE  Guideline-Directed Medical Therapy/Evidence Based Medicine  *HFimpEF*  ACE/ARB/ARNI: Sacubitril-valsartan 97-103 mg twice daily (not taking) Beta Blocker: Carvedilol 25 mg twice daily Aldosterone Antagonist: Spironolactone 25 mg daily Diuretic: Furosemide 40 mg daily SGLT2i: Empagliflozin 10 mg daily  Adherence Assessment  Do you ever forget to take your medication? [] Yes [x] No  Do you ever skip doses due to side effects? [] Yes [x] No  Do you have trouble affording your medicines? [] Yes [x] No  Are you ever unable to pick up your medication due to transportation difficulties? [] Yes [x] No  Do you ever stop taking your medications because you don't believe they are helping? [] Yes [x] No  Do you check your weight daily? [] Yes [x] No   Adherence strategy: pill box  Barriers to obtaining medications: none and Jardiance patient assistance)  Vital signs: HR 70, BP 198/104, weight (pounds) 207 lbs ECHO: Date 05/03/21, EF 60-65% Date 12/07/20, EF 35-40%, mild MR     Latest Ref Rng & Units 03/27/2022   10:58 AM 02/15/2022   10:29 AM 12/26/2021   10:44 AM  BMP  Glucose 70 - 99 mg/dL     BUN 6 - 20 mg/dL 35  25  37   Creatinine 0.44 - 1.00 mg/dL     Sodium 135 - 145 mmol/L 133  139  137   Potassium 3.5 - 5.1 mmol/L 4.2  3.8  4.0   Chloride 98 - 111 mmol/L 99  102  105   CO2 22 - 32 mmol/L 24  28  25    Calcium 8.9 - 10.3 mg/dL 9.3  9.6  9.1     Past Medical History:  Diagnosis Date   CHF (congestive heart failure) (HCC)    Hypertension     ASSESSMENT 56 year old female who presents to the HF clinic for follow up. PMH includes f HTN, recent tobacco use and heart failure. Last acute care admission was more than 6 months ago. Patient is feeling well and report compliance will all  her medication. Amlodipine dose was increased during last OV and patient assistance paperwork completed.  Medication reconciliation was completed during visit, patient still waiting response for Entresto ,but using samples provided.    PLAN  Continue current therapy Additional Entresto samples provided today while waiting for patient-assistance response Repeat BMP and A1C Needs PCP as soon as possible  Time spent: 20 minutes  Blessings Inglett Rodriguez-Guzman PharmD, BCPS 03/27/2022 2:13 PM   Current Outpatient Medications:    amLODipine (NORVASC) 10 MG tablet, Take 1 tablet (10 mg total) by mouth daily., Disp: 30 tablet, Rfl: 5   aspirin EC 81 MG tablet, Take 81 mg by mouth daily., Disp: , Rfl:    atorvastatin (LIPITOR) 40 MG tablet, Take 1 tablet (40 mg total) by mouth daily., Disp: 90 tablet, Rfl: 3   carvedilol (COREG) 25 MG tablet, Take 1 tablet (25 mg total) by mouth 2 (two) times daily with a meal., Disp: 180 tablet, Rfl: 3   empagliflozin (JARDIANCE) 10 MG TABS tablet, Take 1 tablet (10 mg total) by mouth daily before breakfast., Disp: 30 tablet, Rfl: 5   furosemide (LASIX) 40 MG tablet, Take 1 tablet (40 mg total) by mouth daily as needed. For swelling., Disp: 30 tablet, Rfl: 5   isosorbide mononitrate (IMDUR) 30 MG 24 hr tablet, Take  1 tablet (30 mg total) by mouth daily. Please keep appointment on 12/05/2021 for future refills., Disp: 90 tablet, Rfl: 3   sacubitril-valsartan (ENTRESTO) 97-103 MG, Take 1 tablet by mouth 2 (two) times daily., Disp: 180 tablet, Rfl: 3   spironolactone (ALDACTONE) 25 MG tablet, Take 1 tablet (25 mg total) by mouth daily., Disp: 90 tablet, Rfl: 3   MEDICATION ADHERENCES TIPS AND STRATEGIES Taking medication as prescribed improves patient outcomes in heart failure (reduces hospitalizations, improves symptoms, increases survival) Side effects of medications can be managed by decreasing doses, switching agents, stopping drugs, or adding additional therapy.  Please let someone in the [facility] know if you have having bothersome side effects so we can modify your regimen. Do not alter your medication regimen without talking to [place].  Medication reminders can help patients remember to take drugs on time. If you are missing or forgetting doses you can try linking behaviors, using pill boxes, or an electronic reminder like an alarm on your phone or an app. Some people can also get automated phone calls as medication reminders.

## 2022-03-30 ENCOUNTER — Telehealth: Payer: Self-pay | Admitting: Family

## 2022-03-30 ENCOUNTER — Encounter: Payer: Self-pay | Admitting: Family

## 2022-03-30 NOTE — Telephone Encounter (Signed)
Spoke with patient regarding BMP/ A1c results obtained earlier this week. A1c is 12.8% and renal function does not allow for metformin. Emphasized (as I have done at previous visits) that she must get established with a PCP. She is currently living in [facility] with her daughter.   Explained that her elevated A1c means her diabetes is not under control and puts her at increased risk of heart disease and worsening renal function. Reviewed decreasing her consumption of sugary foods/ drinks as well as potatoes & breads. Explained that if she can't get in with a PCP in the next few weeks that she needed to go to an urgent care to begin treatment and she verbalized understanding.

## 2022-04-26 NOTE — Progress Notes (Deleted)
Patient ID: Sharon Delgado, female    DOB: 10-Apr-1966, 57 y.o.   MRN: [id number]  HPI  Ms Zortman is a 57 y/o female with a history of HTN, recent tobacco use and heart failure.   Echo report from 05/03/21 reviewed and showed an EF of 60-65%. Echo report from 12/07/20 reviewed and showed an EF of 35-40% along with mild MR.   LHC done 12/10/20 and showed: Prox RCA to Mid RCA lesion is 40% stenosed.   Mid RCA to Dist RCA lesion is 30% stenosed.   Prox Cx to Mid Cx lesion is 40% stenosed.   1.  Mild to moderate nonobstructive coronary artery disease 2.  Left ventricular angiography was not performed.  EF was moderately reduced by echo. 3.  High normal left ventricular end-diastolic pressure of 12 mmHg.  Has not been admitted or been in the ED in the last 6 months.   She presents today for a follow-up visit with a chief complaint of   Past Medical History:  Diagnosis Date   CHF (congestive heart failure) ([place])    Hypertension     Past Surgical History:  Procedure Laterality Date   LEFT HEART CATH AND CORONARY ANGIOGRAPHY N/A 12/10/2020   Procedure: LEFT HEART CATH AND CORONARY ANGIOGRAPHY;  Surgeon: Wellington Hampshire, MD;  Location: [place] [facility];  Service: Cardiovascular;  Laterality: N/A;   NO PAST SURGERIES     Family History  Problem Relation Age of Onset   Diabetes Mother    Hypertension Mother    COPD Father    Alcoholism Father    Social History   Tobacco Use   Smoking status: Former    Types: Cigarettes, Cigars    Quit date: 12/06/2020    Years since quitting: 1.3   Smokeless tobacco: Never  Substance Use Topics   Alcohol use: Not Currently    Alcohol/week: 15.0 standard drinks of alcohol    Types: 15 Cans of beer per week   No Known Allergies    Review of Systems  Constitutional:  Positive for fatigue. Negative for appetite change.  HENT:  Negative for congestion, postnasal drip and sore throat.   Eyes: Negative.   Respiratory:  Positive for cough and  shortness of breath (when going up steps). Negative for chest tightness and wheezing.   Cardiovascular:  Negative for chest pain, palpitations and leg swelling.  Gastrointestinal:  Negative for abdominal distention and abdominal pain.  Endocrine: Negative.   Genitourinary: Negative.   Musculoskeletal:  Negative for back pain and neck pain.  Skin: Negative.   Allergic/Immunologic: Negative.   Neurological:  Positive for light-headedness (at times) and headaches. Negative for dizziness.  Hematological:  Negative for adenopathy. Does not bruise/bleed easily.  Psychiatric/Behavioral:  Negative for dysphoric mood and sleep disturbance (sleeping on 1 pillow). The patient is not nervous/anxious.      Physical Exam Vitals and nursing note reviewed.  Constitutional:      Appearance: Normal appearance.  HENT:     Head: Normocephalic and atraumatic.  Cardiovascular:     Rate and Rhythm: Normal rate and regular rhythm.  Pulmonary:     Effort: Pulmonary effort is normal. No respiratory distress.     Breath sounds: No wheezing or rales.  Abdominal:     General: There is no distension.     Palpations: Abdomen is soft.     Tenderness: There is no abdominal tenderness.  Musculoskeletal:        General: No tenderness.  Cervical back: Normal range of motion and neck supple.     Right lower leg: No edema.     Left lower leg: No edema.  Skin:    General: Skin is warm and dry.  Neurological:     General: No focal deficit present.     Mental Status: She is alert and oriented to person, place, and time.  Psychiatric:        Mood and Affect: Mood normal.        Behavior: Behavior normal.        Thought Content: Thought content normal.    Assessment & Plan:  1: Chronic heart failure with preserved ejection fraction without structural changes- - NYHA class II - euvolemic today - weighing daily; reminded to call for an overnight weight gain of > 2 pounds or a weekly weight gain of > 5  pounds - weight down 12 pounds since last here 1 month ago; she is trying to eat more healthy and eat less - not adding "much" salt and feels like she's doing better with her salt usage; emphasized the importance of not adding any salt to her food - on GDMT of carvedilol, entresto, jardiance and spironolactone  - saw cardiology (Agbor-Etang) 05/09/21 - taking furosemide PRN & last took it ~ 2 weeks ago - BNP 12/07/20 was 952.8 - PharmD reconciled medications with patient  2: HTN-  - BP  - saw PCP Rosana Hoes) 04/05/22 - BMP 03/27/22 reviewed and showed sodium 133, potassium 4.2, creatinine 1.65 and GFR 36  3: Tobacco use- - hasn't smoked in 1 year  - congratulated on that and continued cessation was encouraged  4: DM- - got established with PCP Rosana Hoes) on 04/05/22 - A1c 04/05/22 was 13.8%   Medication bottles reviewed.

## 2022-04-27 ENCOUNTER — Telehealth: Payer: Self-pay | Admitting: Family

## 2022-04-27 ENCOUNTER — Encounter: Payer: Medicaid Other | Admitting: Family

## 2022-04-27 NOTE — Telephone Encounter (Signed)
Patient did not show for her Heart Failure Clinic appointment on 04/27/22. Will attempt to reschedule.

## 2022-06-03 NOTE — Progress Notes (Unsigned)
Patient ID: Sharon Delgado, female    DOB: 09-Sep-1965, 57 y.o.   MRN: [id number]  HPI  Sharon Delgado is a 57 y/o female with a history of HTN, recent tobacco use and heart failure.   Echo report from 05/03/21 reviewed and showed an EF of 60-65%. Echo report from 12/07/20 reviewed and showed an EF of 35-40% along with mild MR.   LHC done 12/10/20 and showed: Prox RCA to Mid RCA lesion is 40% stenosed.   Mid RCA to Dist RCA lesion is 30% stenosed.   Prox Cx to Mid Cx lesion is 40% stenosed.   1.  Mild to moderate nonobstructive coronary artery disease 2.  Left ventricular angiography was not performed.  EF was moderately reduced by echo. 3.  High normal left ventricular end-diastolic pressure of 12 mmHg.  Has not been admitted or been in the ED in the last 6 months.   She presents today for a HF follow-up visit with a chief complaint of minimal SOB with moderate exertion. Describes this as chronic in nature having been present for several years. Has associated fatigue, cough, wheezing, intermittent chest pain, headaches and light-headedness along with this. Denies any difficulty sleeping, abdominal distention, palpitations, pedal edema or weight gain.   Took her last dose of entresto this morning. Has to turn in income information to [employer] patient assistance and if she doesn't, will need to stop the entresto and begin valsartan instead.   Past Medical History:  Diagnosis Date   CHF (congestive heart failure) (HCC)    Hypertension     Past Surgical History:  Procedure Laterality Date   LEFT HEART CATH AND CORONARY ANGIOGRAPHY N/A 12/10/2020   Procedure: LEFT HEART CATH AND CORONARY ANGIOGRAPHY;  Surgeon: Wellington Hampshire, MD;  Location: [place] [facility];  Service: Cardiovascular;  Laterality: N/A;   NO PAST SURGERIES     Family History  Problem Relation Age of Onset   Diabetes Mother    Hypertension Mother    COPD Father    Alcoholism Father    Social History   Tobacco Use    Smoking status: Former    Types: Cigarettes, Cigars    Quit date: 12/06/2020    Years since quitting: 1.4   Smokeless tobacco: Never  Substance Use Topics   Alcohol use: Not Currently    Alcohol/week: 15.0 standard drinks of alcohol    Types: 15 Cans of beer per week   No Known Allergies  Prior to Admission medications   Medication Sig Start Date End Date Taking? Authorizing Provider  amLODipine (NORVASC) 10 MG tablet Take 1 tablet (10 mg total) by mouth daily. 03/01/22  Yes Darylene Price A, FNP  aspirin EC 81 MG tablet Take 81 mg by mouth daily.   Yes [provider]  atorvastatin (LIPITOR) 40 MG tablet Take 1 tablet (40 mg total) by mouth daily. 12/26/21  Yes Alisa Graff, FNP  carvedilol (COREG) 25 MG tablet Take 1 tablet (25 mg total) by mouth 2 (two) times daily with a meal. 12/26/21  Yes Darylene Price A, FNP  empagliflozin (JARDIANCE) 10 MG TABS tablet Take 1 tablet (10 mg total) by mouth daily before breakfast. 05/25/21  Yes Darylene Price A, FNP  furosemide (LASIX) 40 MG tablet Take 1 tablet (40 mg total) by mouth daily as needed. For swelling. 05/09/21  Yes Kate Sable, MD  isosorbide mononitrate (IMDUR) 30 MG 24 hr tablet Take 1 tablet (30 mg total) by mouth daily. Please  keep appointment on 12/05/2021 for future refills. 12/26/21  Yes Darylene Price A, FNP  spironolactone (ALDACTONE) 25 MG tablet Take 1 tablet (25 mg total) by mouth daily. 12/26/21  Yes Kali Ambler A, FNP  sacubitril-valsartan (ENTRESTO) 97-103 MG Take 1 tablet by mouth 2 (two) times daily. 03/01/22   Alisa Graff, FNP   Review of Systems  Constitutional:  Positive for fatigue. Negative for appetite change.  HENT:  Negative for congestion, postnasal drip and sore throat.   Eyes: Negative.   Respiratory:  Positive for cough, shortness of breath (when going up steps) and wheezing. Negative for chest tightness.   Cardiovascular:  Positive for chest pain (on occasion). Negative for palpitations and  leg swelling.  Gastrointestinal:  Negative for abdominal distention and abdominal pain.  Endocrine: Negative.   Genitourinary: Negative.   Musculoskeletal:  Negative for back pain and neck pain.  Skin: Negative.   Allergic/Immunologic: Negative.   Neurological:  Positive for light-headedness (at times) and headaches. Negative for dizziness.  Hematological:  Negative for adenopathy. Does not bruise/bleed easily.  Psychiatric/Behavioral:  Negative for dysphoric mood and sleep disturbance (sleeping on 1 pillow). The patient is not nervous/anxious.    Vitals:   06/05/22 1011  BP: 114/65  Pulse: (!) 50  SpO2: 97%  Weight: 199 lb 3.2 oz (90.4 kg)   Wt Readings from Last 3 Encounters:  06/05/22 199 lb 3.2 oz (90.4 kg)  03/27/22 197 lb 2 oz (89.4 kg)  03/01/22 209 lb (94.8 kg)   Lab Results  Component Value Date   CREATININE 1.65 (H) 03/27/2022   CREATININE 1.40 (H) 02/15/2022   CREATININE 1.42 (H) 12/26/2021   Physical Exam Vitals and nursing note reviewed.  Constitutional:      Appearance: Normal appearance.  HENT:     Head: Normocephalic and atraumatic.  Cardiovascular:     Rate and Rhythm: Regular rhythm. Bradycardia present.  Pulmonary:     Effort: Pulmonary effort is normal. No respiratory distress.     Breath sounds: No wheezing or rales.  Abdominal:     General: There is no distension.     Palpations: Abdomen is soft.     Tenderness: There is no abdominal tenderness.  Musculoskeletal:        General: No tenderness.     Cervical back: Normal range of motion and neck supple.     Right lower leg: No edema.     Left lower leg: No edema.  Skin:    General: Skin is warm and dry.  Neurological:     General: No focal deficit present.     Mental Status: She is alert and oriented to person, place, and time.  Psychiatric:        Mood and Affect: Mood normal.        Behavior: Behavior normal.        Thought Content: Thought content normal.    Assessment & Plan:  1:  Chronic heart failure with preserved ejection fraction without structural changes- - NYHA class II - euvolemic today - weighing daily; reminded to call for an overnight weight gain of > 2 pounds or a weekly weight gain of > 5 pounds - weight up 2 pounds since last here 6 weeks ago - not adding "much" salt and feels like she's doing better with her salt usage; emphasized the importance of not adding any salt to her food - carvedilol '25mg'$  BID - entresto 97/'103mg'$  BID; samples provided of 49/'51mg'$ ; emphasized turning in income information  for patient assistance, otherwise, will have to stop entresto and begin valsartan - spironolactone '25mg'$  daily - jardiance '10mg'$  daily - isosorbide [place] '30mg'$  daily - saw cardiology (Agbor-Etang) 05/09/21 - taking furosemide PRN  - EKG done today due to bradycardia & unchanged from previous EKG; if bradycardia continues, may need to decrease carvedilol - BNP 12/07/20 was 952.8 - PharmD reconciled medications with patient  2: HTN-  - BP 114/65 - saw PCP Rosana Hoes) 04/05/22 - amlodipine '10mg'$  daily - BMP 03/27/22 reviewed and showed sodium 133, potassium 4.2, creatinine 1.65 and GFR 36  3: Tobacco use- - hasn't smoked in 1 year  - congratulated on that and continued cessation was encouraged  4: DM- - A1c 03/27/22 was 12.8% - taking jardiance '10mg'$  daily   Medication bottles reviewed.   Return in 3 weeks, sooner if needed.

## 2022-06-05 ENCOUNTER — Ambulatory Visit: Payer: Commercial Managed Care - HMO | Attending: Family | Admitting: Family

## 2022-06-05 ENCOUNTER — Encounter: Payer: Self-pay | Admitting: Family

## 2022-06-05 ENCOUNTER — Telehealth: Payer: Self-pay | Admitting: Pharmacist

## 2022-06-05 VITALS — BP 114/65 | HR 50 | Wt 199.2 lb

## 2022-06-05 DIAGNOSIS — R001 Bradycardia, unspecified: Secondary | ICD-10-CM | POA: Insufficient documentation

## 2022-06-05 DIAGNOSIS — I10 Essential (primary) hypertension: Secondary | ICD-10-CM

## 2022-06-05 DIAGNOSIS — E119 Type 2 diabetes mellitus without complications: Secondary | ICD-10-CM | POA: Insufficient documentation

## 2022-06-05 DIAGNOSIS — I110 Hypertensive heart disease with heart failure: Secondary | ICD-10-CM | POA: Diagnosis not present

## 2022-06-05 DIAGNOSIS — E1122 Type 2 diabetes mellitus with diabetic chronic kidney disease: Secondary | ICD-10-CM | POA: Diagnosis not present

## 2022-06-05 DIAGNOSIS — F17290 Nicotine dependence, other tobacco product, uncomplicated: Secondary | ICD-10-CM | POA: Diagnosis not present

## 2022-06-05 DIAGNOSIS — Z720 Tobacco use: Secondary | ICD-10-CM | POA: Diagnosis not present

## 2022-06-05 DIAGNOSIS — Z7984 Long term (current) use of oral hypoglycemic drugs: Secondary | ICD-10-CM | POA: Insufficient documentation

## 2022-06-05 DIAGNOSIS — N1832 Chronic kidney disease, stage 3b: Secondary | ICD-10-CM

## 2022-06-05 DIAGNOSIS — Z79899 Other long term (current) drug therapy: Secondary | ICD-10-CM | POA: Diagnosis not present

## 2022-06-05 DIAGNOSIS — I5032 Chronic diastolic (congestive) heart failure: Secondary | ICD-10-CM

## 2022-06-05 MED ORDER — ATORVASTATIN CALCIUM 40 MG PO TABS: 40.0000 mg | ORAL_TABLET | Freq: Every day | ORAL | 3 refills | Status: DC

## 2022-06-05 NOTE — Telephone Encounter (Signed)
Mark 1040 as "most recent submitted" and fax to [employer] with ID# [id number] to complete application.

## 2022-06-05 NOTE — Patient Instructions (Addendum)
Continue weighing daily and call for an overnight weight gain of 3 pounds or more or a weekly weight gain of more than 5 pounds.   I sent your [facility]atin refill to [facility] in [place].

## 2022-06-05 NOTE — Telephone Encounter (Signed)
[employer] Patient assistance application submitted in November. No answer received. Patient currently out of medication and using samples.  I called [employer] patient assistance. Application still missing proof of income. Patient reports out of work for over a year and unable to provide income information.   Per [employer] service representative, patient should submit last 1040 form and label as last

## 2022-06-13 ENCOUNTER — Other Ambulatory Visit: Payer: Self-pay | Admitting: Family

## 2022-06-15 ENCOUNTER — Telehealth: Payer: Self-pay | Admitting: Pharmacist

## 2022-06-15 NOTE — Telephone Encounter (Signed)
Left message to patient to remind need to bring 1040 form to clinic to complete PAP application for Entresto.

## 2022-06-15 NOTE — Progress Notes (Signed)
Patient ID: Sharon Delgado, female   DOB: 06-13-1965, 57 y.o.   MRN: [id number]  [facility] COUNSELING NOTE  Guideline-Directed Medical Therapy/Evidence Based Medicine  *HFimpEF*  ACE/ARB/ARNI: Sacubitril-valsartan 49-51 mg twice daily Beta Blocker: Carvedilol 25 mg twice daily Aldosterone Antagonist: Spironolactone 25 mg daily Diuretic: Furosemide 40 mg daily SGLT2i: Empagliflozin 10 mg daily  Adherence Assessment  Do you ever forget to take your medication? '[]'$ Yes '[x]'$ No  Do you ever skip doses due to side effects? '[]'$ Yes '[x]'$ No  Do you have trouble affording your medicines? '[x]'$ Yes '[]'$ No  Are you ever unable to pick up your medication due to transportation difficulties? '[]'$ Yes '[x]'$ No  Do you ever stop taking your medications because you don't believe they are helping? '[]'$ Yes '[]'$ No  Do you check your weight daily? '[]'$ Yes '[x]'$ No   Adherence strategy: pill box  Barriers to obtaining medications: Entresto patient assistance remains pending income information. Patient out of work for > 64yr  Vital signs: HR 70, BP 198/104, weight (pounds) 207 lbs ECHO: Date 05/03/21, EF 60-65% Date 12/07/20, EF 35-40%, mild MR     Latest Ref Rng & Units 03/27/2022   10:58 AM 02/15/2022   10:29 AM 12/26/2021   10:44 AM  BMP  Glucose 70 - 99 mg/dL 498  322  305   BUN 6 - 20 mg/dL 35  25  37   Creatinine 0.44 - 1.00 mg/dL 1.65  1.40  1.42   Sodium 135 - 145 mmol/L 133  139  137   Potassium 3.5 - 5.1 mmol/L 4.2  3.8  4.0   Chloride 98 - 111 mmol/L 99  102  105   CO2 22 - 32 mmol/L '24  28  25   '$ Calcium 8.9 - 10.3 mg/dL 9.3  9.6  9.1     Past Medical History:  Diagnosis Date   [place]    Hypertension     ASSESSMENT 56year old female who presents to the HF clinic for follow up. PMH includes f HTN, recent tobacco use and heart failure. Last acute care admission was more than 6 months ago. Patient is feeling well  and report compliance will all her medication. Still no answer from patient assistance due to lack of proof of income. Patient unemployed for over 1 yr.  Medication reconciliation was completed during visit, patient still waiting response for Entresto ,but using samples provided. I called [employer] assistance program to determine what paperwork wan be submitted for someone unemployed for > 1 year. Patient needs to submit last 1040 received with ID # [id number] statement indicating is the "most recent 1040 available".   PLAN  Continue current therapy Additional Entresto samples provided today while waiting for patient-assistance response [employer] PAP approved until 08/2022 Bring most recent 1040 from to complete patient assistance paperwork, otherwise will change Entresto to Valsartan.  Time spent: 20 minutes  Tachina Spoonemore Rodriguez-Guzman PharmD, BCPS 06/15/2022 2:05 PM   Current Outpatient Medications:    amLODipine (NORVASC) 10 MG tablet, TAKE 1 TABLET BY MOUTH EVERY DAY, Disp: 90 tablet, Rfl: 3   aspirin EC 81 MG tablet, Take 81 mg by mouth daily., Disp: , Rfl:    atorvastatin (LIPITOR) 40 MG tablet, Take 1 tablet (40 mg total) by mouth daily., Disp: 90 tablet, Rfl: 3   carvedilol (COREG) 25 MG tablet, Take 1 tablet (25 mg total) by mouth 2 (two) times daily with a meal., Disp: 180 tablet, Rfl: 3  empagliflozin (JARDIANCE) 10 MG TABS tablet, Take 1 tablet (10 mg total) by mouth daily before breakfast., Disp: 30 tablet, Rfl: 5   furosemide (LASIX) 40 MG tablet, Take 1 tablet (40 mg total) by mouth daily as needed. For swelling., Disp: 30 tablet, Rfl: 5   isosorbide mononitrate (IMDUR) 30 MG 24 hr tablet, Take 1 tablet (30 mg total) by mouth daily. Please keep appointment on 12/05/2021 for future refills., Disp: 90 tablet, Rfl: 3   sacubitril-valsartan (ENTRESTO) 97-103 MG, Take 1 tablet by mouth 2 (two) times daily., Disp: 180 tablet, Rfl: 3   spironolactone (ALDACTONE) 25 MG tablet, Take 1 tablet  (25 mg total) by mouth daily., Disp: 90 tablet, Rfl: 3   MEDICATION ADHERENCES TIPS AND STRATEGIES Taking medication as prescribed improves patient outcomes in heart failure (reduces hospitalizations, improves symptoms, increases survival) Side effects of medications can be managed by decreasing doses, switching agents, stopping drugs, or adding additional therapy. Please let someone in the [facility] know if you have having bothersome side effects so we can modify your regimen. Do not alter your medication regimen without talking to [place].  Medication reminders can help patients remember to take drugs on time. If you are missing or forgetting doses you can try linking behaviors, using pill boxes, or an electronic reminder like an alarm on your phone or an app. Some people can also get automated phone calls as medication reminders.

## 2022-06-28 ENCOUNTER — Encounter: Payer: Self-pay | Admitting: Family

## 2022-06-28 ENCOUNTER — Ambulatory Visit: Payer: BLUE CROSS/BLUE SHIELD | Attending: Family | Admitting: Family

## 2022-06-28 VITALS — BP 114/52 | HR 53 | Wt 202.0 lb

## 2022-06-28 DIAGNOSIS — N1832 Chronic kidney disease, stage 3b: Secondary | ICD-10-CM

## 2022-06-28 DIAGNOSIS — R519 Headache, unspecified: Secondary | ICD-10-CM | POA: Insufficient documentation

## 2022-06-28 DIAGNOSIS — I110 Hypertensive heart disease with heart failure: Secondary | ICD-10-CM | POA: Insufficient documentation

## 2022-06-28 DIAGNOSIS — E119 Type 2 diabetes mellitus without complications: Secondary | ICD-10-CM | POA: Insufficient documentation

## 2022-06-28 DIAGNOSIS — I10 Essential (primary) hypertension: Secondary | ICD-10-CM | POA: Diagnosis not present

## 2022-06-28 DIAGNOSIS — I2510 Atherosclerotic heart disease of native coronary artery without angina pectoris: Secondary | ICD-10-CM | POA: Diagnosis not present

## 2022-06-28 DIAGNOSIS — R5383 Other fatigue: Secondary | ICD-10-CM | POA: Insufficient documentation

## 2022-06-28 DIAGNOSIS — Z79899 Other long term (current) drug therapy: Secondary | ICD-10-CM | POA: Diagnosis not present

## 2022-06-28 DIAGNOSIS — I5032 Chronic diastolic (congestive) heart failure: Secondary | ICD-10-CM | POA: Insufficient documentation

## 2022-06-28 DIAGNOSIS — R059 Cough, unspecified: Secondary | ICD-10-CM | POA: Insufficient documentation

## 2022-06-28 DIAGNOSIS — E1122 Type 2 diabetes mellitus with diabetic chronic kidney disease: Secondary | ICD-10-CM | POA: Diagnosis not present

## 2022-06-28 DIAGNOSIS — Z7984 Long term (current) use of oral hypoglycemic drugs: Secondary | ICD-10-CM | POA: Diagnosis not present

## 2022-06-28 MED ORDER — SACUBITRIL-VALSARTAN 49-51 MG PO TABS: 1.0000 | ORAL_TABLET | Freq: Two times a day (BID) | ORAL | 3 refills | Status: DC

## 2022-06-28 NOTE — Progress Notes (Signed)
Patient ID: Sharon Delgado, female    DOB: 20-Jan-1966, 57 y.o.   MRN: [id number]  HPI  Sharon Delgado is a 57 y/o female with a history of HTN, recent tobacco use and heart failure.   Echo 05/03/21: EF of 60-65%. Echo 12/07/20: EF of 35-40% along with mild MR.   LHC 12/10/20:  Prox RCA to Mid RCA lesion is 40% stenosed.   Mid RCA to Dist RCA lesion is 30% stenosed.   Prox Cx to Mid Cx lesion is 40% stenosed.  1.  Mild to moderate nonobstructive coronary artery disease 2.  Left ventricular angiography was not performed.  EF was moderately reduced by echo. 3.  High normal left ventricular end-diastolic pressure of 12 mmHg.  Has not been admitted or been in the ED in the last 6 months.   She presents today for a HF follow-up visit with a chief complaint of minimal fatigue upon moderate exertion. Chronic in nature. Has associated cough, headaches and occasional light-headedness along with this. Denies difficulty sleeping, abdominal distention, palpitations, pedal edema, chest pain, wheezing, SOB or weight gain.   Brought her income information today so that we can send this to [employer] patient assistance program  Past Medical History:  Diagnosis Date   CHF (congestive heart failure) ([place])    Hypertension     Past Surgical History:  Procedure Laterality Date   LEFT HEART CATH AND CORONARY ANGIOGRAPHY N/A 12/10/2020   Procedure: LEFT HEART CATH AND CORONARY ANGIOGRAPHY;  Surgeon: Wellington Hampshire, MD;  Location: [facility];  Service: Cardiovascular;  Laterality: N/A;   NO PAST SURGERIES     Family History  Problem Relation Age of Onset   Diabetes Mother    Hypertension Mother    COPD Father    Alcoholism Father    Social History   Tobacco Use   Smoking status: Former    Types: Cigarettes, Cigars    Quit date: 12/06/2020    Years since quitting: 1.5   Smokeless tobacco: Never  Substance Use Topics   Alcohol use: Not Currently    Alcohol/week: 15.0 standard drinks of  alcohol    Types: 15 Cans of beer per week   No Known Allergies  Prior to Admission medications   Medication Sig Start Date End Date Taking? Authorizing Provider  acetaminophen (TYLENOL) 500 MG tablet Take 500 mg by mouth in the morning and at bedtime.   Yes [provider]  amLODipine (NORVASC) 10 MG tablet TAKE 1 TABLET BY MOUTH EVERY DAY 06/14/22  Yes Darylene Price A, FNP  aspirin EC 81 MG tablet Take 81 mg by mouth daily.   Yes [provider]  atorvastatin (LIPITOR) 40 MG tablet Take 1 tablet (40 mg total) by mouth daily. 06/05/22  Yes Alisa Graff, FNP  carvedilol (COREG) 25 MG tablet Take 1 tablet (25 mg total) by mouth 2 (two) times daily with a meal. 12/26/21  Yes Darylene Price A, FNP  empagliflozin (JARDIANCE) 10 MG TABS tablet Take 1 tablet (10 mg total) by mouth daily before breakfast. 05/25/21  Yes Darylene Price A, FNP  furosemide (LASIX) 40 MG tablet Take 1 tablet (40 mg total) by mouth daily as needed. For swelling. 05/09/21  Yes Agbor-Etang, Aaron Edelman, MD  glimepiride (AMARYL) 1 MG tablet Take 1 mg by mouth daily with breakfast.   Yes [provider]  isosorbide mononitrate (IMDUR) 30 MG 24 hr tablet Take 1 tablet (30 mg total) by mouth daily. Please keep  appointment on 12/05/2021 for future refills. 12/26/21  Yes Roye Gustafson A, FNP  sacubitril-valsartan (ENTRESTO) 49-51 MG Take 1 tablet by mouth 2 (two) times daily. 06/28/22  Yes Darylene Price A, FNP  spironolactone (ALDACTONE) 25 MG tablet Take 1 tablet (25 mg total) by mouth daily. 12/26/21  Yes Alisa Graff, FNP    Review of Systems  Constitutional:  Positive for fatigue. Negative for appetite change.  HENT:  Negative for congestion, postnasal drip and sore throat.   Eyes: Negative.   Respiratory:  Positive for cough. Negative for chest tightness, shortness of breath and wheezing.   Cardiovascular:  Negative for chest pain, palpitations and leg swelling.  Gastrointestinal:  Negative for abdominal  distention and abdominal pain.  Endocrine: Negative.   Genitourinary: Negative.   Musculoskeletal:  Negative for back pain and neck pain.  Skin: Negative.   Allergic/Immunologic: Negative.   Neurological:  Positive for light-headedness (at times) and headaches. Negative for dizziness.  Hematological:  Negative for adenopathy. Does not bruise/bleed easily.  Psychiatric/Behavioral:  Negative for dysphoric mood and sleep disturbance (sleeping on 1 pillow). The patient is not nervous/anxious.    Vitals:   06/28/22 1029  BP: (!) 114/52  Pulse: (!) 53  SpO2: 99%  Weight: 202 lb (91.6 kg)   Wt Readings from Last 3 Encounters:  06/28/22 202 lb (91.6 kg)  06/05/22 199 lb 3.2 oz (90.4 kg)  03/27/22 197 lb 2 oz (89.4 kg)   Lab Results  Component Value Date   CREATININE 1.65 (H) 03/27/2022   CREATININE 1.40 (H) 02/15/2022   CREATININE 1.42 (H) 12/26/2021   Physical Exam Vitals and nursing note reviewed.  Constitutional:      Appearance: Normal appearance.  HENT:     Head: Normocephalic and atraumatic.  Cardiovascular:     Rate and Rhythm: Regular rhythm. Bradycardia present.  Pulmonary:     Effort: Pulmonary effort is normal. No respiratory distress.     Breath sounds: No wheezing or rales.  Abdominal:     General: There is no distension.     Palpations: Abdomen is soft.     Tenderness: There is no abdominal tenderness.  Musculoskeletal:        General: No tenderness.     Cervical back: Normal range of motion and neck supple.     Right lower leg: No edema.     Left lower leg: No edema.  Skin:    General: Skin is warm and dry.  Neurological:     General: No focal deficit present.     Mental Status: She is alert and oriented to person, place, and time.  Psychiatric:        Mood and Affect: Mood normal.        Behavior: Behavior normal.        Thought Content: Thought content normal.    Assessment & Plan:  1: Chronic heart failure with preserved ejection fraction- -  NYHA class II - euvolemic today - weighing daily; reminded to call for an overnight weight gain of > 2 pounds or a weekly weight gain of > 5 pounds - weight up 3 pounds since last here 3 weeks ago - echo 05/03/21: EF of 60-65%. Echo 12/07/20: EF of 35-40% along with mild MR.  - LHC 12/10/20:  Prox RCA to Mid RCA lesion is 40% stenosed.   Mid RCA to Dist RCA lesion is 30% stenosed.   Prox Cx to Mid Cx lesion is 40% stenosed.  1.  Mild  to moderate nonobstructive coronary artery disease 2.  Left ventricular angiography was not performed.  EF was moderately reduced by echo. 3.  High normal left ventricular end-diastolic pressure of 12 mmHg. - not adding "much" salt and feels like she's doing better with her salt usage; emphasized the importance of not adding any salt to her food - carvedilol 25mg  BID - entresto 49/51mg  BID; continue this dose d/t soft BP's - spironolactone 25mg  daily - jardiance 10mg  daily - isosorbide MN 30mg  daily - saw cardiology (Eunice) 05/09/21 - taking furosemide PRN  - will fax income information to [employer] patient assistance for entresto - BNP 12/07/20 was 952.8 - PharmD reconciled medications with patient  2: HTN-  - BP 114/52 - saw PCP Rosana Hoes) 04/05/22 - amlodipine 10mg  daily - BMP 03/27/22 reviewed and showed sodium 133, potassium 4.2, creatinine 1.65 and GFR 36  3: DM- - A1c 03/27/22 was 12.8% - taking jardiance 10mg  daily - hasn't checked her glucose in "awhile"   Return in 1 month, sooner if needed.

## 2022-06-28 NOTE — Progress Notes (Signed)
Sharon Delgado - PHARMACIST COUNSELING NOTE  Guideline-Directed Medical Therapy/Evidence Based Medicine  ACE/ARB/ARNI: Sacubitril-valsartan 49-51 mg twice daily Beta Blocker: Carvedilol 25 mg twice daily Aldosterone Antagonist: Spironolactone 25 mg daily Diuretic: Furosemide 40 mg daily PRN SGLT2i: Empagliflozin 10 mg daily  Adherence Assessment  Do you ever forget to take your medication? [] Yes [x] No  Do you ever skip doses due to side effects? [] Yes [x] No  Do you have trouble affording your medicines? [] Yes [x] No  Are you ever unable to pick up your medication due to transportation difficulties? [] Yes [x] No  Do you ever stop taking your medications because you don't believe they are helping? [] Yes [x] No  Do you check your weight daily? [x] Yes [] No   Adherence strategy: Pill bottles. Patient states she is compliant.   Barriers to obtaining medications: Cost. Entresto patient assistance started.    Vital signs: HR 53, BP 114/52, weight (pounds) 202 ECHO: Date 04/2021, EF 60-65,      Latest Ref Rng & Units 03/27/2022   10:58 AM 02/15/2022   10:29 AM 12/26/2021   10:44 AM  BMP  Glucose 70 - 99 mg/dL 498  322  305   BUN 6 - 20 mg/dL 35  25  37   Creatinine 0.44 - 1.00 mg/dL 1.65  1.40  1.42   Sodium 135 - 145 mmol/L 133  139  137   Potassium 3.5 - 5.1 mmol/L 4.2  3.8  4.0   Chloride 98 - 111 mmol/L 99  102  105   CO2 22 - 32 mmol/L 24  28  25    Calcium 8.9 - 10.3 mg/dL 9.3  9.6  9.1     Past Medical History:  Diagnosis Date   CHF (congestive heart failure) ([place])    Hypertension     ASSESSMENT 57 year old female who presents to the HF clinic for follow up appointment. PMH includes f HTN, recent tobacco use and heart failure. Last acute care admission was more than 6 months ago. Patient is feeling well and report compliance will all her medication. Still no answer from patient assistance due to lack of proof of income. Patient  unemployed for over 1 yr. Patient has some complaints of lightheadedness at times  but not often.   Faxed 1040 to patient assistance 3/20. ID # [id number]. We provided her with a month of samples during this visit.    PLAN Changing dose from entresto from 97-103 to 49-51 mg BID. Pt's BP is well controlled on this dose.  Additional Entresto samples provided today while waiting for patient-assistance response Farxiga PAP approved until 08/2022    Time spent: 45 minutes  Oswald Hillock, Pharm.D. Clinical Pharmacist 06/28/2022 11:11 AM    Current Outpatient Medications:    acetaminophen (TYLENOL) 500 MG tablet, Take 500 mg by mouth in the morning and at bedtime., Disp: , Rfl:    amLODipine (NORVASC) 10 MG tablet, TAKE 1 TABLET BY MOUTH EVERY DAY, Disp: 90 tablet, Rfl: 3   aspirin EC 81 MG tablet, Take 81 mg by mouth daily., Disp: , Rfl:    atorvastatin (LIPITOR) 40 MG tablet, Take 1 tablet (40 mg total) by mouth daily., Disp: 90 tablet, Rfl: 3   carvedilol (COREG) 25 MG tablet, Take 1 tablet (25 mg total) by mouth 2 (two) times daily with a meal., Disp: 180 tablet, Rfl: 3   empagliflozin (JARDIANCE) 10 MG TABS tablet, Take 1 tablet (10 mg total) by mouth daily before breakfast.,  Disp: 30 tablet, Rfl: 5   furosemide (LASIX) 40 MG tablet, Take 1 tablet (40 mg total) by mouth daily as needed. For swelling., Disp: 30 tablet, Rfl: 5   glimepiride (AMARYL) 1 MG tablet, Take 1 mg by mouth daily with breakfast., Disp: , Rfl:    isosorbide mononitrate (IMDUR) 30 MG 24 hr tablet, Take 1 tablet (30 mg total) by mouth daily. Please keep appointment on 12/05/2021 for future refills., Disp: 90 tablet, Rfl: 3   sacubitril-valsartan (ENTRESTO) 49-51 MG, Take 1 tablet by mouth 2 (two) times daily., Disp: 180 tablet, Rfl: 3   spironolactone (ALDACTONE) 25 MG tablet, Take 1 tablet (25 mg total) by mouth daily., Disp: 90 tablet, Rfl: 3   COUNSELING POINTS/CLINICAL PEARLS   DRUGS TO CAUTION IN HEART FAILURE   Drug or Class Mechanism  Analgesics NSAIDs COX-2 inhibitors Glucocorticoids  Sodium and water retention, increased systemic vascular resistance, decreased response to diuretics   Diabetes Medications Metformin Thiazolidinediones Rosiglitazone (Avandia) Pioglitazone (Actos) DPP4 Inhibitors Saxagliptin (Onglyza) Sitagliptin (Januvia)   Lactic acidosis Possible calcium channel blockade   Unknown  Antiarrhythmics Class I  Flecainide Disopyramide Class III Sotalol Other Dronedarone  Negative inotrope, proarrhythmic   Proarrhythmic, beta blockade  Negative inotrope  Antihypertensives Alpha Blockers Doxazosin Calcium Channel Blockers Diltiazem Verapamil Nifedipine Central Alpha Adrenergics Moxonidine Peripheral Vasodilators Minoxidil  Increases renin and aldosterone  Negative inotrope    Possible sympathetic withdrawal  Unknown  Anti-infective Itraconazole Amphotericin B  Negative inotrope Unknown  Hematologic Anagrelide Cilostazol   Possible inhibition of PD IV Inhibition of PD III causing arrhythmias  Neurologic/Psychiatric Stimulants Anti-Seizure Drugs Carbamazepine Pregabalin Antidepressants Tricyclics Citalopram Parkinsons Bromocriptine Pergolide Pramipexole Antipsychotics Clozapine Antimigraine Ergotamine Methysergide Appetite suppressants Bipolar Lithium  Peripheral alpha and beta agonist activity  Negative inotrope and chronotrope Calcium channel blockade  Negative inotrope, proarrhythmic Dose-dependent QT prolongation  Excessive serotonin activity/valvular damage Excessive serotonin activity/valvular damage Unknown  IgE mediated hypersensitivy, calcium channel blockade  Excessive serotonin activity/valvular damage Excessive serotonin activity/valvular damage Valvular damage  Direct myofibrillar degeneration, adrenergic stimulation  Antimalarials Chloroquine Hydroxychloroquine Intracellular inhibition of  lysosomal enzymes  Urologic Agents Alpha Blockers Doxazosin Prazosin Tamsulosin Terazosin  Increased renin and aldosterone  Adapted from Page Carleene Overlie, et al. "Drugs That May Cause or Exacerbate Heart Failure: A Scientific Statement from the American Heart  Association." Circulation 2016; 134:e32-e69. DOI: 10.1161/CIR.0000000000000426   MEDICATION ADHERENCES TIPS AND STRATEGIES Taking medication as prescribed improves patient outcomes in heart failure (reduces hospitalizations, improves symptoms, increases survival) Side effects of medications can be managed by decreasing doses, switching agents, stopping drugs, or adding additional therapy. Please let someone in the [facility] know if you have having bothersome side effects so we can modify your regimen. Do not alter your medication regimen without talking to [place].  Medication reminders can help patients remember to take drugs on time. If you are missing or forgetting doses you can try linking behaviors, using pill boxes, or an electronic reminder like an alarm on your phone or an app. Some people can also get automated phone calls as medication reminders.

## 2022-06-29 ENCOUNTER — Telehealth: Payer: Self-pay | Admitting: Pharmacist

## 2022-06-29 NOTE — Telephone Encounter (Signed)
Entresto patient assistance approved until 04/10/2023.  Patient notified and aware of how to order medication.  Merline Perkin Rodriguez-Guzman PharmD, BCPS 06/29/2022 8:47 AM

## 2022-07-04 ENCOUNTER — Encounter: Payer: Self-pay | Admitting: Pharmacist

## 2022-07-14 ENCOUNTER — Telehealth: Payer: Self-pay

## 2022-07-14 MED ORDER — ATORVASTATIN CALCIUM 40 MG PO TABS: 40.0000 mg | ORAL_TABLET | Freq: Every day | ORAL | 3 refills | Status: DC

## 2022-07-14 MED ORDER — EMPAGLIFLOZIN 10 MG PO TABS: 10.0000 mg | ORAL_TABLET | Freq: Every day | ORAL | 5 refills | Status: DC

## 2022-07-14 NOTE — Telephone Encounter (Signed)
Pt called requesting refill for Lipitor 40 mg tablet and Jardiance 10 mg tablet. Medications sent to requested pharmacy per pt.

## 2022-07-17 ENCOUNTER — Other Ambulatory Visit: Payer: Self-pay

## 2022-07-17 MED ORDER — EMPAGLIFLOZIN 10 MG PO TABS: 10.0000 mg | ORAL_TABLET | Freq: Every day | ORAL | 5 refills | Status: DC

## 2022-07-30 NOTE — Progress Notes (Unsigned)
Patient ID: Sharon Delgado, female    DOB: [birth year], 57 y.o.   MRN: [id number]  Primary cardiologist: Debbe Odea, MD (last seen 01/23) PCP: Nyra Jabs, MD (last seen 12/23)  HPI  Sharon Delgado is a 57 y/o female with a history of HTN, tobacco use and heart failure.   Echo 05/03/21: EF of 60-65%. Echo 12/07/20: EF of 35-40% along with mild MR.   LHC 12/10/20:  Prox RCA to Mid RCA lesion is 40% stenosed.   Mid RCA to Dist RCA lesion is 30% stenosed.   Prox Cx to Mid Cx lesion is 40% stenosed.  1.  Mild to moderate nonobstructive coronary artery disease 2.  Left ventricular angiography was not performed.  EF was moderately reduced by echo. 3.  High normal left ventricular end-diastolic pressure of 12 mmHg.  Has not been admitted or been in the ED in the last 6 months.   She presents today for a HF follow-up visit with a chief complaint of chest pain. Describes this as intermittent in nature and occurs over her left anterior chest wall. Denies radiation of symptoms and doesn't think it's any worse than previous times. Does admit to being under a lot of stress as she's worried about her sister. Has associated fatigue, headaches and light-headedness along with this. Denies difficulty sleeping, abdominal distention, palpitations, pedal edema, wheezing, SOB, cough or weight gain.   She says that she's almost out of entresto/ jardiance and isn't sure what needs to be done. Gets both of these medications through patient assistance programs.   Past Medical History:  Diagnosis Date   CHF (congestive heart failure) ([facility])    Hypertension     Past Surgical History:  Procedure Laterality Date   LEFT HEART CATH AND CORONARY ANGIOGRAPHY N/A 12/10/2020   Procedure: LEFT HEART CATH AND CORONARY ANGIOGRAPHY;  Surgeon: Iran Ouch, MD;  Location: [facility];  Service: Cardiovascular;  Laterality: N/A;   NO PAST SURGERIES     Family History  Problem Relation Age of Onset    Diabetes Mother    Hypertension Mother    COPD Father    Alcoholism Father    Social History   Tobacco Use   Smoking status: Former    Types: Cigarettes, Cigars    Quit date: 12/06/2020    Years since quitting: 1.6   Smokeless tobacco: Never  Substance Use Topics   Alcohol use: Not Currently    Alcohol/week: 15.0 standard drinks of alcohol    Types: 15 Cans of beer per week   No Known Allergies  Prior to Admission medications   Medication Sig Start Date End Date Taking? Authorizing Provider  acetaminophen (TYLENOL) 500 MG tablet Take 500 mg by mouth in the morning and at bedtime.   Yes [provider]  amLODipine (NORVASC) 10 MG tablet TAKE 1 TABLET BY MOUTH EVERY DAY 06/14/22  Yes Clarisa Kindred A, FNP  aspirin EC 81 MG tablet Take 81 mg by mouth daily.   Yes [provider]  atorvastatin (LIPITOR) 40 MG tablet Take 1 tablet (40 mg total) by mouth daily. 07/14/22  Yes Delma Freeze, FNP  carvedilol (COREG) 25 MG tablet Take 1 tablet (25 mg total) by mouth 2 (two) times daily with a meal. 12/26/21  Yes Clarisa Kindred A, FNP  empagliflozin (JARDIANCE) 10 MG TABS tablet Take 1 tablet (10 mg total) by mouth daily before breakfast. 07/17/22  Yes Clarisa Kindred A, FNP  furosemide (LASIX) 40 MG  tablet Take 1 tablet (40 mg total) by mouth daily as needed. For swelling. 05/09/21  Yes Agbor-Etang, Arlys John, MD  glimepiride (AMARYL) 1 MG tablet Take 1 mg by mouth daily with breakfast.   Yes [provider]  isosorbide mononitrate (IMDUR) 30 MG 24 hr tablet Take 1 tablet (30 mg total) by mouth daily. Please keep appointment on 12/05/2021 for future refills. 12/26/21  Yes Hadi Dubin A, FNP  sacubitril-valsartan (ENTRESTO) 49-51 MG Take 1 tablet by mouth 2 (two) times daily. 06/28/22  Yes Clarisa Kindred A, FNP  spironolactone (ALDACTONE) 25 MG tablet Take 1 tablet (25 mg total) by mouth daily. 12/26/21  Yes Delma Freeze, FNP   Review of Systems  Constitutional:  Positive for  fatigue. Negative for appetite change.  HENT:  Negative for congestion, postnasal drip and sore throat.   Eyes: Negative.   Respiratory:  Negative for cough, chest tightness, shortness of breath and wheezing.   Cardiovascular:  Positive for chest pain (on occasion). Negative for palpitations and leg swelling.  Gastrointestinal:  Negative for abdominal distention and abdominal pain.  Endocrine: Negative.   Genitourinary: Negative.   Musculoskeletal:  Negative for back pain and neck pain.  Skin: Negative.   Allergic/Immunologic: Negative.   Neurological:  Positive for light-headedness (at times) and headaches. Negative for dizziness.  Hematological:  Negative for adenopathy. Does not bruise/bleed easily.  Psychiatric/Behavioral:  Negative for dysphoric mood and sleep disturbance (sleeping on 1 pillow). The patient is not nervous/anxious.    Vitals:   07/31/22 1031  BP: 128/67  Pulse: (!) 52  SpO2: 99%  Weight: 205 lb 12.8 oz (93.4 kg)   Wt Readings from Last 3 Encounters:  07/31/22 205 lb 12.8 oz (93.4 kg)  06/28/22 202 lb (91.6 kg)  06/05/22 199 lb 3.2 oz (90.4 kg)   Lab Results  Component Value Date   CREATININE 1.65 (H) 03/27/2022   CREATININE 1.40 (H) 02/15/2022   CREATININE 1.42 (H) 12/26/2021   Physical Exam Vitals and nursing note reviewed.  Constitutional:      Appearance: Normal appearance.  HENT:     Head: Normocephalic and atraumatic.  Cardiovascular:     Rate and Rhythm: Regular rhythm. Bradycardia present.  Pulmonary:     Effort: Pulmonary effort is normal. No respiratory distress.     Breath sounds: No wheezing or rales.  Abdominal:     General: There is no distension.     Palpations: Abdomen is soft.     Tenderness: There is no abdominal tenderness.  Musculoskeletal:        General: No tenderness.     Cervical back: Normal range of motion and neck supple.     Right lower leg: No edema.     Left lower leg: No edema.  Skin:    General: Skin is warm and  dry.  Neurological:     General: No focal deficit present.     Mental Status: She is alert and oriented to person, place, and time.  Psychiatric:        Mood and Affect: Mood normal.        Behavior: Behavior normal.        Thought Content: Thought content normal.    Assessment & Plan:  1: NICM with preserved ejection fraction- - NYHA class II - euvolemic today - weighing daily; reminded to call for an overnight weight gain of > 2 pounds or a weekly weight gain of > 5 pounds - weight up 3 pounds since  last here 1 month ago - echo 05/03/21: EF of 60-65%. Echo 12/07/20: EF of 35-40% along with mild MR.  - LHC 12/10/20:  Prox RCA to Mid RCA lesion is 40% stenosed.   Mid RCA to Dist RCA lesion is 30% stenosed.   Prox Cx to Mid Cx lesion is 40% stenosed.  1.  Mild to moderate nonobstructive coronary artery disease 2.  Left ventricular angiography was not performed.  EF was moderately reduced by echo. 3.  High normal left ventricular end-diastolic pressure of 12 mmHg. - not adding "much" salt and feels like she's doing better with her salt usage; emphasized the importance of not adding any salt to her food - decrease carvedilol 12.5mg  BID due to bradycardia - continue entresto 49/51mg  BID - continue spironolactone  daily - continue jardiance  daily - continue isosorbide MN  daily - saw cardiology (Agbor-Etang) 01/23 - taking furosemide PRN; last took this 07/29/22 - RN provided samples of entresto/ jardiance; emphasized that she call both companies and speak with someone to request a refill be sent to her home as they will not automatically send the refills - BNP 12/07/20 was 952.8  2: HTN-  - BP 128/67 - saw PCP Earlene Plater) 12/23 - continue amlodipine  daily - BMP 03/27/22 reviewed and showed sodium 133, potassium 4.2, creatinine 1.65 and GFR 36  3: DM- - A1c 03/27/22 was 12.8% - taking jardiance  daily - hasn't checked her glucose in "awhile"  4: Nonobstructive  CAD/ chest pain- - EKG today: SB w/ left anterior fasicular block; unchanged from 02/24 EKG - decreasing carvedilol per above  Return in 1 month, sooner if needed.

## 2022-07-31 ENCOUNTER — Ambulatory Visit: Payer: BLUE CROSS/BLUE SHIELD | Attending: Family | Admitting: Family

## 2022-07-31 ENCOUNTER — Encounter: Payer: Self-pay | Admitting: Family

## 2022-07-31 VITALS — BP 128/67 | HR 52 | Wt 205.8 lb

## 2022-07-31 DIAGNOSIS — R079 Chest pain, unspecified: Secondary | ICD-10-CM | POA: Insufficient documentation

## 2022-07-31 DIAGNOSIS — Z7984 Long term (current) use of oral hypoglycemic drugs: Secondary | ICD-10-CM | POA: Diagnosis not present

## 2022-07-31 DIAGNOSIS — Z833 Family history of diabetes mellitus: Secondary | ICD-10-CM | POA: Insufficient documentation

## 2022-07-31 DIAGNOSIS — R42 Dizziness and giddiness: Secondary | ICD-10-CM | POA: Diagnosis not present

## 2022-07-31 DIAGNOSIS — I10 Essential (primary) hypertension: Secondary | ICD-10-CM

## 2022-07-31 DIAGNOSIS — I5032 Chronic diastolic (congestive) heart failure: Secondary | ICD-10-CM

## 2022-07-31 DIAGNOSIS — E1122 Type 2 diabetes mellitus with diabetic chronic kidney disease: Secondary | ICD-10-CM

## 2022-07-31 DIAGNOSIS — R5383 Other fatigue: Secondary | ICD-10-CM | POA: Diagnosis not present

## 2022-07-31 DIAGNOSIS — R519 Headache, unspecified: Secondary | ICD-10-CM | POA: Insufficient documentation

## 2022-07-31 DIAGNOSIS — N1832 Chronic kidney disease, stage 3b: Secondary | ICD-10-CM

## 2022-07-31 DIAGNOSIS — I110 Hypertensive heart disease with heart failure: Secondary | ICD-10-CM | POA: Insufficient documentation

## 2022-07-31 DIAGNOSIS — I2510 Atherosclerotic heart disease of native coronary artery without angina pectoris: Secondary | ICD-10-CM | POA: Insufficient documentation

## 2022-07-31 DIAGNOSIS — I428 Other cardiomyopathies: Secondary | ICD-10-CM | POA: Diagnosis not present

## 2022-07-31 DIAGNOSIS — I509 Heart failure, unspecified: Secondary | ICD-10-CM | POA: Insufficient documentation

## 2022-07-31 DIAGNOSIS — Z79899 Other long term (current) drug therapy: Secondary | ICD-10-CM | POA: Diagnosis not present

## 2022-07-31 NOTE — Progress Notes (Signed)
Medication Samples have been provided to the patient.  Drug name: Jardiance        Strength: 10 m g        Qty: 2  LOT: [id number]  Exp.Date: JAN-2026  Dosing instructions: take 1 tablet daily.  The patient has been instructed regarding the correct time, dose, and frequency of taking this medication, including desired effects and most common side effects.   Electa Sniff 10:58 AM 07/31/2022

## 2022-07-31 NOTE — Progress Notes (Addendum)
Medication Samples have been provided to the patient.  Drug name: Sherryll Burger       Strength: 49/51 mg        Qty: 1  LOT: [id number]  Exp.Date: MAR 2025  Dosing instructions: take 1 Tablet ,2 times a day  The patient has been instructed regarding the correct time, dose, and frequency of taking this medication, including desired effects and most common side effects.   Sharon Delgado 10:44 AM 07/31/2022

## 2022-07-31 NOTE — Patient Instructions (Addendum)
Call entresto and jardiance companies to let them know you need refills.    Decrease your carvedilol to 1/2 tablet in the morning and 1/2 tablet in the evening

## 2022-09-11 ENCOUNTER — Other Ambulatory Visit: Payer: Self-pay

## 2022-09-11 ENCOUNTER — Ambulatory Visit (HOSPITAL_BASED_OUTPATIENT_CLINIC_OR_DEPARTMENT_OTHER): Payer: 59 | Admitting: Family

## 2022-09-11 ENCOUNTER — Inpatient Hospital Stay
Admission: EM | Admit: 2022-09-11 | Discharge: 2022-09-14 | DRG: 638 | Disposition: A | Discharge status: Home / Self Care | Payer: 59 | Source: Ambulatory Visit | Attending: Internal Medicine | Admitting: Internal Medicine

## 2022-09-11 ENCOUNTER — Other Ambulatory Visit
Admission: RE | Admit: 2022-09-11 | Discharge: 2022-09-11 | Disposition: A | Discharge status: Home / Self Care | Payer: 59 | Source: Ambulatory Visit | Attending: Family | Admitting: Family

## 2022-09-11 ENCOUNTER — Encounter: Payer: Self-pay | Admitting: Internal Medicine

## 2022-09-11 ENCOUNTER — Encounter: Payer: Self-pay | Admitting: Family

## 2022-09-11 VITALS — BP 123/61 | HR 54 | Ht 62.0 in | Wt 195.0 lb

## 2022-09-11 DIAGNOSIS — Z8249 Family history of ischemic heart disease and other diseases of the circulatory system: Secondary | ICD-10-CM

## 2022-09-11 DIAGNOSIS — E1100 Type 2 diabetes mellitus with hyperosmolarity without nonketotic hyperglycemic-hyperosmolar coma (NKHHC): Secondary | ICD-10-CM | POA: Diagnosis present

## 2022-09-11 DIAGNOSIS — E1129 Type 2 diabetes mellitus with other diabetic kidney complication: Secondary | ICD-10-CM | POA: Diagnosis present

## 2022-09-11 DIAGNOSIS — N1831 Chronic kidney disease, stage 3a: Secondary | ICD-10-CM

## 2022-09-11 DIAGNOSIS — Z79899 Other long term (current) drug therapy: Secondary | ICD-10-CM | POA: Diagnosis not present

## 2022-09-11 DIAGNOSIS — N1832 Chronic kidney disease, stage 3b: Secondary | ICD-10-CM

## 2022-09-11 DIAGNOSIS — E876 Hypokalemia: Secondary | ICD-10-CM | POA: Diagnosis present

## 2022-09-11 DIAGNOSIS — Z6835 Body mass index (BMI) 35.0-35.9, adult: Secondary | ICD-10-CM | POA: Diagnosis not present

## 2022-09-11 DIAGNOSIS — R739 Hyperglycemia, unspecified: Principal | ICD-10-CM

## 2022-09-11 DIAGNOSIS — R001 Bradycardia, unspecified: Secondary | ICD-10-CM | POA: Diagnosis present

## 2022-09-11 DIAGNOSIS — Z7984 Long term (current) use of oral hypoglycemic drugs: Secondary | ICD-10-CM | POA: Diagnosis not present

## 2022-09-11 DIAGNOSIS — E669 Obesity, unspecified: Secondary | ICD-10-CM | POA: Diagnosis present

## 2022-09-11 DIAGNOSIS — Z87891 Personal history of nicotine dependence: Secondary | ICD-10-CM | POA: Diagnosis not present

## 2022-09-11 DIAGNOSIS — E1110 Type 2 diabetes mellitus with ketoacidosis without coma: Principal | ICD-10-CM | POA: Diagnosis present

## 2022-09-11 DIAGNOSIS — I428 Other cardiomyopathies: Secondary | ICD-10-CM | POA: Diagnosis present

## 2022-09-11 DIAGNOSIS — I5032 Chronic diastolic (congestive) heart failure: Secondary | ICD-10-CM

## 2022-09-11 DIAGNOSIS — E1122 Type 2 diabetes mellitus with diabetic chronic kidney disease: Secondary | ICD-10-CM | POA: Insufficient documentation

## 2022-09-11 DIAGNOSIS — N179 Acute kidney failure, unspecified: Secondary | ICD-10-CM

## 2022-09-11 DIAGNOSIS — E785 Hyperlipidemia, unspecified: Secondary | ICD-10-CM | POA: Diagnosis present

## 2022-09-11 DIAGNOSIS — I10 Essential (primary) hypertension: Secondary | ICD-10-CM | POA: Diagnosis not present

## 2022-09-11 DIAGNOSIS — E860 Dehydration: Secondary | ICD-10-CM | POA: Diagnosis present

## 2022-09-11 DIAGNOSIS — Z91148 Patient's other noncompliance with medication regimen for other reason: Secondary | ICD-10-CM

## 2022-09-11 DIAGNOSIS — D72829 Elevated white blood cell count, unspecified: Secondary | ICD-10-CM | POA: Diagnosis present

## 2022-09-11 DIAGNOSIS — Z833 Family history of diabetes mellitus: Secondary | ICD-10-CM

## 2022-09-11 DIAGNOSIS — E871 Hypo-osmolality and hyponatremia: Secondary | ICD-10-CM | POA: Diagnosis present

## 2022-09-11 DIAGNOSIS — I130 Hypertensive heart and chronic kidney disease with heart failure and stage 1 through stage 4 chronic kidney disease, or unspecified chronic kidney disease: Secondary | ICD-10-CM | POA: Diagnosis present

## 2022-09-11 DIAGNOSIS — Z7982 Long term (current) use of aspirin: Secondary | ICD-10-CM

## 2022-09-11 DIAGNOSIS — I2510 Atherosclerotic heart disease of native coronary artery without angina pectoris: Secondary | ICD-10-CM

## 2022-09-11 LAB — BASIC METABOLIC PANEL
Anion gap: 11 (ref 5–15)
Anion gap: 13 (ref 5–15)
Anion gap: 13 (ref 5–15)
Anion gap: 14 (ref 5–15)
Anion gap: 11 (ref 5–15)
BUN: 48 mg/dL — ABNORMAL HIGH (ref 6–20)
BUN: 48 mg/dL — ABNORMAL HIGH (ref 6–20)
BUN: 48 mg/dL — ABNORMAL HIGH (ref 6–20)
BUN: 46 mg/dL — ABNORMAL HIGH (ref 6–20)
BUN: 44 mg/dL — ABNORMAL HIGH (ref 6–20)
CO2: 21 mmol/L — ABNORMAL LOW (ref 22–32)
CO2: 17 mmol/L — ABNORMAL LOW (ref 22–32)
CO2: 20 mmol/L — ABNORMAL LOW (ref 22–32)
CO2: 20 mmol/L — ABNORMAL LOW (ref 22–32)
CO2: 21 mmol/L — ABNORMAL LOW (ref 22–32)
Calcium: 9.1 mg/dL (ref 8.9–10.3)
Calcium: 8.7 mg/dL — ABNORMAL LOW (ref 8.9–10.3)
Calcium: 8.9 mg/dL (ref 8.9–10.3)
Calcium: 9.4 mg/dL (ref 8.9–10.3)
Calcium: 8.8 mg/dL — ABNORMAL LOW (ref 8.9–10.3)
Chloride: 89 mmol/L — ABNORMAL LOW (ref 98–111)
Chloride: 88 mmol/L — ABNORMAL LOW (ref 98–111)
Chloride: 95 mmol/L — ABNORMAL LOW (ref 98–111)
Chloride: 99 mmol/L (ref 98–111)
Chloride: 99 mmol/L (ref 98–111)
Creatinine, Ser: 2.23 mg/dL — ABNORMAL HIGH (ref 0.44–1.00)
Creatinine, Ser: 2.29 mg/dL — ABNORMAL HIGH (ref 0.44–1.00)
Creatinine, Ser: 2.12 mg/dL — ABNORMAL HIGH (ref 0.44–1.00)
Creatinine, Ser: 2.08 mg/dL — ABNORMAL HIGH (ref 0.44–1.00)
Creatinine, Ser: 1.8 mg/dL — ABNORMAL HIGH (ref 0.44–1.00)
GFR, Estimated: 25 mL/min — ABNORMAL LOW (ref >60)
GFR, Estimated: 24 mL/min — ABNORMAL LOW (ref >60)
GFR, Estimated: 27 mL/min — ABNORMAL LOW (ref >60)
GFR, Estimated: 27 mL/min — ABNORMAL LOW (ref >60)
GFR, Estimated: 32 mL/min — ABNORMAL LOW (ref >60)
Glucose, Bld: 1028 mg/dL (ref 70–99)
Glucose, Bld: 1123 mg/dL (ref 70–99)
Glucose, Bld: 687 mg/dL (ref 70–99)
Glucose, Bld: 267 mg/dL — ABNORMAL HIGH (ref 70–99)
Glucose, Bld: 362 mg/dL — ABNORMAL HIGH (ref 70–99)
Potassium: 4.8 mmol/L (ref 3.5–5.1)
Potassium: 5.1 mmol/L (ref 3.5–5.1)
Potassium: 3.8 mmol/L (ref 3.5–5.1)
Potassium: 3.7 mmol/L (ref 3.5–5.1)
Potassium: 4.1 mmol/L (ref 3.5–5.1)
Sodium: 121 mmol/L — ABNORMAL LOW (ref 135–145)
Sodium: 118 mmol/L — CL (ref 135–145)
Sodium: 128 mmol/L — ABNORMAL LOW (ref 135–145)
Sodium: 133 mmol/L — ABNORMAL LOW (ref 135–145)
Sodium: 131 mmol/L — ABNORMAL LOW (ref 135–145)

## 2022-09-11 LAB — CBC
HCT: 36.8 % (ref 36.0–46.0)
Hemoglobin: 11.6 g/dL — ABNORMAL LOW (ref 12.0–15.0)
MCH: 29.3 pg (ref 26.0–34.0)
MCHC: 31.5 g/dL (ref 30.0–36.0)
MCV: 92.9 fL (ref 80.0–100.0)
Platelets: 196 10*3/uL (ref 150–400)
RBC: 3.96 MIL/uL (ref 3.87–5.11)
RDW: 13.1 % (ref 11.5–15.5)
WBC: 12 10*3/uL — ABNORMAL HIGH (ref 4.0–10.5)
nRBC: 0 % (ref 0.0–0.2)

## 2022-09-11 LAB — GLUCOSE, CAPILLARY
Glucose-Capillary: 207 mg/dL — ABNORMAL HIGH (ref 70–99)
Glucose-Capillary: 209 mg/dL — ABNORMAL HIGH (ref 70–99)
Glucose-Capillary: 270 mg/dL — ABNORMAL HIGH (ref 70–99)
Glucose-Capillary: 350 mg/dL — ABNORMAL HIGH (ref 70–99)
Glucose-Capillary: 366 mg/dL — ABNORMAL HIGH (ref 70–99)
Glucose-Capillary: 372 mg/dL — ABNORMAL HIGH (ref 70–99)

## 2022-09-11 LAB — CBG MONITORING, ED
Glucose-Capillary: >600 mg/dL (ref 70–99)
Glucose-Capillary: >600 mg/dL (ref 70–99)
Glucose-Capillary: 541 mg/dL (ref 70–99)
Glucose-Capillary: 374 mg/dL — ABNORMAL HIGH (ref 70–99)
Glucose-Capillary: 476 mg/dL — ABNORMAL HIGH (ref 70–99)
Glucose-Capillary: 278 mg/dL — ABNORMAL HIGH (ref 70–99)

## 2022-09-11 LAB — OSMOLALITY: Osmolality: 313 mOsm/kg — ABNORMAL HIGH (ref 275–295)

## 2022-09-11 LAB — MRSA NEXT GEN BY PCR, NASAL: MRSA by PCR Next Gen: NOT DETECTED

## 2022-09-11 LAB — BRAIN NATRIURETIC PEPTIDE: B Natriuretic Peptide: 24.8 pg/mL (ref 0.0–100.0)

## 2022-09-11 MED ORDER — LACTATED RINGERS IV SOLN: INTRAVENOUS | Status: DC

## 2022-09-11 MED ORDER — ACETAMINOPHEN 325 MG PO TABS: 650.0000 mg | ORAL_TABLET | Freq: Four times a day (QID) | ORAL | Status: DC | PRN

## 2022-09-11 MED ORDER — ENOXAPARIN SODIUM 40 MG/0.4ML IJ SOSY: 40.0000 mg | PREFILLED_SYRINGE | INTRAMUSCULAR | Status: DC

## 2022-09-11 MED ORDER — ONDANSETRON HCL 4 MG/2ML IJ SOLN: 4.0000 mg | Freq: Three times a day (TID) | INTRAMUSCULAR | Status: DC | PRN

## 2022-09-11 MED ORDER — AMLODIPINE BESYLATE 10 MG PO TABS
10.0000 mg | ORAL_TABLET | Freq: Every day | ORAL | Status: DC
  Administered 2022-09-12 – 2022-09-14 (×3): 10 mg via ORAL
  Filled 2022-09-11 (×3): qty 1

## 2022-09-11 MED ORDER — INSULIN REGULAR(HUMAN) IN NACL 100-0.9 UT/100ML-% IV SOLN
INTRAVENOUS | Status: DC
  Administered 2022-09-11: 11 [IU]/h via INTRAVENOUS
  Administered 2022-09-12: 4.4 [IU]/h via INTRAVENOUS
  Filled 2022-09-11 (×2): qty 100

## 2022-09-11 MED ORDER — CARVEDILOL 12.5 MG PO TABS
12.5000 mg | ORAL_TABLET | Freq: Two times a day (BID) | ORAL | Status: DC
  Administered 2022-09-12 – 2022-09-14 (×2): 12.5 mg via ORAL
  Filled 2022-09-11 (×2): qty 1

## 2022-09-11 MED ORDER — DEXTROSE IN LACTATED RINGERS 5 % IV SOLN: INTRAVENOUS | Status: DC

## 2022-09-11 MED ORDER — INSULIN ASPART 100 UNIT/ML IJ SOLN
10.0000 [IU] | Freq: Once | INTRAMUSCULAR | Status: AC
  Administered 2022-09-11: 10 [IU] via INTRAVENOUS
  Filled 2022-09-11: qty 1

## 2022-09-11 MED ORDER — ENOXAPARIN SODIUM 30 MG/0.3ML IJ SOSY
30.0000 mg | PREFILLED_SYRINGE | INTRAMUSCULAR | Status: DC
  Administered 2022-09-11 – 2022-09-12 (×2): 30 mg via SUBCUTANEOUS
  Filled 2022-09-11 (×2): qty 0.3

## 2022-09-11 MED ORDER — ATORVASTATIN CALCIUM 20 MG PO TABS
40.0000 mg | ORAL_TABLET | Freq: Every day | ORAL | Status: DC
  Administered 2022-09-12 – 2022-09-14 (×3): 40 mg via ORAL
  Filled 2022-09-11 (×3): qty 2

## 2022-09-11 MED ORDER — ASPIRIN 81 MG PO TBEC
81.0000 mg | DELAYED_RELEASE_TABLET | Freq: Every day | ORAL | Status: DC
  Administered 2022-09-12 – 2022-09-14 (×3): 81 mg via ORAL
  Filled 2022-09-11 (×3): qty 1

## 2022-09-11 MED ORDER — ISOSORBIDE MONONITRATE ER 30 MG PO TB24
30.0000 mg | ORAL_TABLET | Freq: Every day | ORAL | Status: DC
  Administered 2022-09-12 – 2022-09-14 (×3): 30 mg via ORAL
  Filled 2022-09-11 (×3): qty 1

## 2022-09-11 MED ORDER — HYDRALAZINE HCL 20 MG/ML IJ SOLN: 5.0000 mg | INTRAMUSCULAR | Status: DC | PRN

## 2022-09-11 MED ORDER — DEXTROSE 50 % IV SOLN: 0.0000 mL | INTRAVENOUS | Status: DC | PRN

## 2022-09-11 MED ORDER — SODIUM CHLORIDE 0.9 % IV BOLUS
1000.0000 mL | Freq: Once | INTRAVENOUS | Status: AC
  Administered 2022-09-11: 1000 mL via INTRAVENOUS

## 2022-09-11 NOTE — Progress Notes (Signed)
Medication Samples have been provided to the patient.  Drug name: Jardiance       Strength: 10 mg        Qty: 4  LOT: [id number]  Exp.Date: 02/2024  Dosing instructions: Take one tablet by mouth once daily  The patient has been instructed regarding the correct time, dose, and frequency of taking this medication, including desired effects and most common side effects.   Simonne Maffucci 10:15 AM 09/11/2022

## 2022-09-11 NOTE — H&P (Signed)
History and Physical    Sharon Delgado ZOX:[id number] DOB: [birth year] DOA: 09/11/2022  Referring MD/NP/PA:   PCP: Patient, No Pcp Per   Patient coming from:  The patient is coming from home.     Chief Complaint: Abnormal lab with hyperglycemia  HPI: Sharon Delgado is a 57 y.o. female with medical history significant of hypertension, hyperlipidemia, diabetes mellitus, diastolic CHF, CKD-3A, former smoker, alcohol abuse (not drinking heavily anymore), obesity, who presents with abnormal lab with hyperglycemia.  Patient states that she is supposed to inject Trulicity every week, but stopped doing so 2 months ago.  She is still taking Jardiance.  Today she went to cardiologist office for regular follow-up, and was found to have hyperglycemia with blood sugar > 1000.  She is sent to ED for further evaluation and treatment.  Patient is asymptomatic.  Denies chest pain, cough, shortness breath.  No nausea, vomiting, diarrhea or abdominal pain.  No symptoms of UTI.  No dizziness.  No confusion.  She states that she is no longer drinking alcohol heavily.  Last drinking of little bit of beer was on 5/11.  Data reviewed independently and ED Course: pt was found to have blood sugar 1123, anion gap 13, bicarbonate 17, pseudohyponatremia, WBC 12.0, worsening renal function, temperature normal, blood pressure 154/60, heart rate 54, RR 18, oxygen saturation 95% on room air.  Patient is admitted to stepdown as inpatient.   EKG: I have personally reviewed.  Sinus rhythm, QTc 450, bradycardia with heart rate of 57, poor R wave progression.   Review of Systems:   General: no fevers, chills, no body weight gain, fatigue HEENT: no blurry vision, hearing changes or sore throat Respiratory: no dyspnea, coughing, wheezing CV: no chest pain, no palpitations GI: no nausea, vomiting, abdominal pain, diarrhea, constipation GU: no dysuria, burning on urination, increased urinary frequency, hematuria  Ext: no leg  edema Neuro: no unilateral weakness, numbness, or tingling, no vision change or hearing loss Skin: no rash, no skin tear. MSK: No muscle spasm, no deformity, no limitation of range of movement in spin Heme: No easy bruising.  Travel history: No recent long distant travel.   Allergy: No Known Allergies  Past Medical History:  Diagnosis Date   CHF (congestive heart failure) (HCC)    Hypertension     Past Surgical History:  Procedure Laterality Date   LEFT HEART CATH AND CORONARY ANGIOGRAPHY N/A 12/10/2020   Procedure: LEFT HEART CATH AND CORONARY ANGIOGRAPHY;  Surgeon: Iran Ouch, MD;  Location: [facility] INVASIVE CV LAB;  Service: Cardiovascular;  Laterality: N/A;   NO PAST SURGERIES      Social History:  reports that she quit smoking about 21 months ago. Her smoking use included cigarettes and cigars. She has never used smokeless tobacco. She reports that she does not currently use alcohol after a past usage of about 15.0 standard drinks of alcohol per week. She reports that she does not use drugs.  Family History:  Family History  Problem Relation Age of Onset   Diabetes Mother    Hypertension Mother    COPD Father    Alcoholism Father      Prior to Admission medications   Medication Sig Start Date End Date Taking? Authorizing Provider  acetaminophen (TYLENOL) 500 MG tablet Take 500 mg by mouth in the morning and at bedtime.    [provider]  amLODipine (NORVASC) 10 MG tablet TAKE 1 TABLET BY MOUTH EVERY DAY 06/14/22   Delma Freeze,  FNP  aspirin EC 81 MG tablet Take 81 mg by mouth daily.    [provider]  atorvastatin (LIPITOR) 40 MG tablet Take 1 tablet (40 mg total) by mouth daily. 07/14/22   Delma Freeze, FNP  carvedilol (COREG) 25 MG tablet Take 1 tablet (25 mg total) by mouth 2 (two) times daily with a meal. Patient taking differently: Take 12.5 mg by mouth 2 (two) times daily with a meal. 12/26/21   Delma Freeze, FNP  empagliflozin  (JARDIANCE) 10 MG TABS tablet Take 1 tablet (10 mg total) by mouth daily before breakfast. Patient not taking: Reported on 09/11/2022 07/17/22   Delma Freeze, FNP  furosemide (LASIX) 40 MG tablet Take 1 tablet (40 mg total) by mouth daily as needed. For swelling. 05/09/21   Debbe Odea, MD  glimepiride (AMARYL) 1 MG tablet Take 1 mg by mouth daily with breakfast. Patient not taking: Reported on 09/11/2022    [provider]  isosorbide mononitrate (IMDUR) 30 MG 24 hr tablet Take 1 tablet (30 mg total) by mouth daily. Please keep appointment on 12/05/2021 for future refills. 12/26/21   Delma Freeze, FNP  sacubitril-valsartan (ENTRESTO) 49-51 MG Take 1 tablet by mouth 2 (two) times daily. 06/28/22   Delma Freeze, FNP  spironolactone (ALDACTONE) 25 MG tablet Take 1 tablet (25 mg total) by mouth daily. 12/26/21   Delma Freeze, FNP    Physical Exam: Vitals:   09/11/22 1256 09/11/22 1645 09/11/22 1648  BP: (!) 154/60 (!) 110/59   Pulse: 65 (!) 54   Resp: 18 18   Temp: 97.7 F (36.5 C)  97.7 F (36.5 C)  TempSrc:   Oral  SpO2: 95% 99%    General: Not in acute distress.  HEENT:       Eyes: PERRL, EOMI, no jaundice       ENT: No discharge from the ears and nose, no pharynx injection, no tonsillar enlargement.        Neck: No JVD, no bruit, no mass felt. Heme: No neck lymph node enlargement. Cardiac: S1/S2, RRR, No murmurs, No gallops or rubs. Respiratory: No rales, wheezing, rhonchi or rubs. GI: Soft, nondistended, nontender, no rebound pain, no organomegaly, BS present. GU: No hematuria Ext: No pitting leg edema bilaterally. 1+DP/PT pulse bilaterally. Musculoskeletal: No joint deformities, No joint redness or warmth, no limitation of ROM in spin. Skin: No rashes.  Neuro: Alert, oriented X3, cranial nerves II-XII grossly intact, moves all extremities normally.  Psych: Patient is not psychotic, no suicidal or hemocidal ideation.  Labs on Admission: I have personally  reviewed following labs and imaging studies  CBC: Recent Labs  Lab 09/11/22 1258  WBC 12.0*  HGB 11.6*  HCT 36.8  MCV 92.9  PLT 196   Basic Metabolic Panel: Recent Labs  Lab 09/11/22 1056 09/11/22 1258  NA 121* 118*  K 4.8 5.1  CL 89* 88*  CO2 21* 17*  GLUCOSE 1,028* 1,123*  BUN 48* 48*  CREATININE 2.23* 2.29*  CALCIUM 9.1 8.7*   GFR: Estimated Creatinine Clearance: 27.7 mL/min (A) (by C-G formula based on SCr of 2.29 mg/dL (H)). Liver Function Tests: No results for input(s): "AST", "ALT", "ALKPHOS", "BILITOT", "PROT", "ALBUMIN" in the last 168 hours. No results for input(s): "LIPASE", "AMYLASE" in the last 168 hours. No results for input(s): "AMMONIA" in the last 168 hours. Coagulation Profile: No results for input(s): "INR", "PROTIME" in the last 168 hours. Cardiac Enzymes: No results for input(s): "CKTOTAL", "CKMB", "  CKMBINDEX", "TROPONINI" in the last 168 hours. BNP (last 3 results) No results for input(s): "PROBNP" in the last 8760 hours. HbA1C: No results for input(s): "HGBA1C" in the last 72 hours. CBG: Recent Labs  Lab 09/11/22 1304 09/11/22 1457 09/11/22 1556 09/11/22 1634  GLUCAP >600* >600* 541* 476*   Lipid Profile: No results for input(s): "CHOL", "HDL", "LDLCALC", "TRIG", "CHOLHDL", "LDLDIRECT" in the last 72 hours. Thyroid Function Tests: No results for input(s): "TSH", "T4TOTAL", "FREET4", "T3FREE", "THYROIDAB" in the last 72 hours. Anemia Panel: No results for input(s): "VITAMINB12", "FOLATE", "FERRITIN", "TIBC", "IRON", "RETICCTPCT" in the last 72 hours. Urine analysis:    Component Value Date/Time   COLORURINE STRAW (A) 12/09/2020 0511   APPEARANCEUR CLEAR (A) 12/09/2020 0511   LABSPEC 1.006 12/09/2020 0511   PHURINE 7.0 12/09/2020 0511   GLUCOSEU 50 (A) 12/09/2020 0511   HGBUR SMALL (A) 12/09/2020 0511   BILIRUBINUR NEGATIVE 12/09/2020 0511   KETONESUR NEGATIVE 12/09/2020 0511   PROTEINUR 30 (A) 12/09/2020 0511   NITRITE NEGATIVE  12/09/2020 0511   LEUKOCYTESUR NEGATIVE 12/09/2020 0511   Sepsis Labs: @LABRCNTIP (procalcitonin:4,lacticidven:4) )No results found for this or any previous visit (from the past 240 hour(s)).   Radiological Exams on Admission: No results found.    Assessment/Plan Principal Problem:   Hyperosmolar hyperglycemic state (HHS) (HCC) Active Problems:   Type II diabetes mellitus with renal manifestations (HCC)   HTN (hypertension)   HLD (hyperlipidemia)   Chronic diastolic CHF (congestive heart failure) (HCC)   Acute renal failure superimposed on stage 3a chronic kidney disease (HCC)   Leukocytosis   Obesity (BMI 30-39.9)   Assessment and Plan:  Hyperosmolar hyperglycemic state (HHS) (HCC): Blood sugar 1123, bicarbonate 17, but anion gap is normal 17, patient has HHS.  Mental status normal.  May be in the early stage of developing DKA.  This is due to medication noncompliance.  - Admit to stepdown as inpt - IVF:  1L of NS bolus (due to diastolic CHF, cannot give aggressive IV fluid) - start insulin drip with BMP q4h - IVF: LR at 75 cc/h, will switch to D5-LR at 75cc/h when CBG<250 - replete K as needed - Zofran prn nausea  - NPO  - consult to diabetic educator  Type II diabetes mellitus with renal manifestations [facility]): Recent A1c 13.8, poorly controlled.  Patient said taking Jardiance, not taking Trulicity.  Now has severe hyperglycemia. -On insulin drip  HTN (hypertension) -IV hydralazine as needed -Amlodipine, Coreg,, Imdur -Hold Lasix, spironolactone, Entresto due to worsening renal function  HLD (hyperlipidemia) -Lipitor  Chronic diastolic CHF (congestive heart failure) (HCC): 2D echo 05/03/2021 showed EF of 60 to 65% with grade 1 diastolic dysfunction.  Patient does not have leg edema or JVD.  Patient is clinically dry. -Hold Lasix and spironolactone due to worsening renal function  Acute renal failure superimposed on stage 3a chronic kidney disease (HCC): Baseline  creatinine 1.65 on 03/27/2022.  Her creatinine is 2.29, BUN 48, GFR 25.  This is likely due to dehydration and continuation of diuretics and Entresto. -Hold diuretics and Entresto -IV fluids above  Leukocytosis: WBC 12.0, likely due to DKA, no source infection identified. -Follow-up CBC  Obesity (BMI 30-39.9): Body weight 88.5 kg, BMI 35.67 -Encourage losing weight, -Exercise and healthy diet      DVT ppx: SQ Lovenox  Code Status: Full code     Family Communication:    Yes, patient's 2 sisters at bed side.    Disposition Plan:  Anticipate discharge back to previous  environment  Consults called:  none  Admission status and Level of care: Stepdown:   as inpt      Dispo: The patient is from: Home              Anticipated d/c is to: Home              Anticipated d/c date is: 2 days              Patient currently is not medically stable to d/c.    Severity of Illness:  The appropriate patient status for this patient is INPATIENT. Inpatient status is judged to be reasonable and necessary in order to provide the required intensity of service to ensure the patient's safety. The patient's presenting symptoms, physical exam findings, and initial radiographic and laboratory data in the context of their chronic comorbidities is felt to place them at high risk for further clinical deterioration. Furthermore, it is not anticipated that the patient will be medically stable for discharge from the hospital within 2 midnights of admission.   * I certify that at the point of admission it is my clinical judgment that the patient will require inpatient hospital care spanning beyond 2 midnights from the point of admission due to high intensity of service, high risk for further deterioration and high frequency of surveillance required.*       Date of Service 09/11/2022    Lorretta Harp [facility]   If 7PM-7AM, please contact night-coverage [email or web address] 09/11/2022, 4:49 PM

## 2022-09-11 NOTE — Progress Notes (Signed)
Communicated with MD Mansy regarding HR below 60, instructed to hold Coreg.

## 2022-09-11 NOTE — ED Provider Notes (Signed)
[facility] Provider Note    Event Date/Time   First MD Initiated Contact with Patient 09/11/22 1345     (approximate)   History   Hyperglycemia   HPI  Sharon Delgado is a 57 y.o. female  with diabetes, who comes in with sugar over 1000.  She has not been on diabetes medication due to insurance not covering anymore. She does not check her blood sugar at home. She denies any obvious symptoms. NO known confusion.  Patient states that she has been taking her oral medications her glipizide and her Jardiance.  She is not have any other infectious symptoms or any other concerns.   Physical Exam   Triage Vital Signs: ED Triage Vitals [09/11/22 1256]  Enc Vitals Group     BP (!) 154/60     Pulse Rate 65     Resp 18     Temp 97.7 F (36.5 C)     Temp src      SpO2 95 %     Weight      Height      Head Circumference      Peak Flow      Pain Score 0     Pain Loc      Pain Edu?      Excl. in GC?     Most recent vital signs: Vitals:   09/11/22 1256  BP: (!) 154/60  Pulse: 65  Resp: 18  Temp: 97.7 F (36.5 C)  SpO2: 95%     General: Awake, no distress.  CV:  Good peripheral perfusion.  Resp:  Normal effort.  Abd:  No distention.  Soft nontender Other:  No swelling in legs   ED Results / Procedures / Treatments   Labs (all labs ordered are listed, but only abnormal results are displayed) Labs Reviewed  CBC - Abnormal; Notable for the following components:      Result Value   WBC 12.0 (*)    Hemoglobin 11.6 (*)    All other components within normal limits  BASIC METABOLIC PANEL - Abnormal; Notable for the following components:   Sodium 118 (*)    Chloride 88 (*)    CO2 17 (*)    Glucose, Bld 1,123 (*)    BUN 48 (*)    Creatinine, Ser 2.29 (*)    Calcium 8.7 (*)    GFR, Estimated 24 (*)    All other components within normal limits  CBG MONITORING, ED - Abnormal; Notable for the following components:   Glucose-Capillary >600 (*)     All other components within normal limits     PROCEDURES:  Critical Care performed: No  Procedures   MEDICATIONS ORDERED IN ED: Medications  sodium chloride 0.9 % bolus 1,000 mL (1,000 mLs Intravenous New Bag/Given 09/11/22 1310)  insulin aspart (novoLOG) injection 10 Units (10 Units Intravenous Given 09/11/22 1408)     IMPRESSION / MDM / ASSESSMENT AND PLAN / ED COURSE  I reviewed the triage vital signs and the nursing notes.   Patient's presentation is most consistent with acute presentation with potential threat to life or bodily function.   Patient comes in with concerns for elevated sugar but otherwise asymptomatic.  Blood work ordered evaluate for Principal [employer] abnormalities, AKI, DKA  White coutn elevated but no infectious symptoms BMP high sugar with low bicarb but her AG is normal does have new aki Corrected sodium is 134  Patient has been given some fluids and IV  insulin.  Given how significant her elevation is and I think it would be difficult to get this sugar into a more reasonable range with out starting insulin at home I think be best to admit patient to the hospital for diabetes education, initiating insulin and working to make sure that she will be able to fill it.  Therefore I did discuss the hospital team for admission.   FINAL CLINICAL IMPRESSION(S) / ED DIAGNOSES   Final diagnoses:  Hyperglycemia     Rx / DC Orders   ED Discharge Orders     None        Note:  This document was prepared using Dragon voice recognition software and may include unintentional dictation errors.   Concha Se, MD 09/11/22 [phone]

## 2022-09-11 NOTE — Progress Notes (Signed)
PCP: Nyra Jabs, MD @ [facility] in [facility] (last seen 12/23) Primary Cardiologist: Debbe Odea, MD (last seen 01/23)  HPI:  Sharon Delgado is a 57 y/o female with a history of HTN, DM, CAD (LHC done 09/22), tobacco use and heart failure.   Echo 05/03/21: EF of 60-65%. Echo 12/07/20: EF of 35-40% along with mild MR.   LHC 12/10/20:  Prox RCA to Mid RCA lesion is 40% stenosed.   Mid RCA to Dist RCA lesion is 30% stenosed.   Prox Cx to Mid Cx lesion is 40% stenosed.  1.  Mild to moderate nonobstructive coronary artery disease 2.  Left ventricular angiography was not performed.  EF was moderately reduced by echo. 3.  High normal left ventricular end-diastolic pressure of 12 mmHg.  Has not been admitted or been in the ED in the last 6 months.   She presents today for a HF follow-up visit with a chief complaint of minimal fatigue with moderate exertion. Chronic in nature although she does feel like this has improved since carvedilol was decreased at last visit. Yesterday had n/v diarrhea "all day" which has since resolved. Denies SOB, cough, chest pain, palpitations, abdominal pain, N/V today, dizziness or difficulty sleeping   Out of jardiance for the last week, waiting on patient assistance program to mail paperwork & then medications.   At last visit, carvedilol was decreased to 12.5mg  BID due to bradycardia.   ROS: All systems negative except as listed in HPI, PMH and Problem List.  SH:  Social History   Socioeconomic History   Marital status: Single    Spouse name: Not on file   Number of children: Not on file   Years of education: Not on file   Highest education level: Not on file  Occupational History   Not on file  Tobacco Use   Smoking status: Former    Types: Cigarettes, Cigars    Quit date: 12/06/2020    Years since quitting: 1.7   Smokeless tobacco: Never  Vaping Use   Vaping Use: Never used  Substance and Sexual Activity   Alcohol use: Not Currently     Alcohol/week: 15.0 standard drinks of alcohol    Types: 15 Cans of beer per week   Drug use: No   Sexual activity: Not on file  Other Topics Concern   Not on file  Social History Narrative   Not on file   Social Determinants of Health   Financial Resource Strain: Not on file  Food Insecurity: Not on file  Transportation Needs: Not on file  Physical Activity: Not on file  Stress: Not on file  Social Connections: Not on file  Intimate Partner Violence: Not on file    FH:  Family History  Problem Relation Age of Onset   Diabetes Mother    Hypertension Mother    COPD Father    Alcoholism Father     Past Medical History:  Diagnosis Date   CHF (congestive heart failure) (HCC)    Hypertension     Current Outpatient Medications  Medication Sig Dispense Refill   acetaminophen (TYLENOL) 500 MG tablet Take 500 mg by mouth in the morning and at bedtime.     amLODipine (NORVASC) 10 MG tablet TAKE 1 TABLET BY MOUTH EVERY DAY 90 tablet 3   aspirin EC 81 MG tablet Take 81 mg by mouth daily.     atorvastatin (LIPITOR) 40 MG tablet Take 1 tablet (40 mg total) by mouth daily. 90  tablet 3   carvedilol (COREG) 25 MG tablet Take 1 tablet (25 mg total) by mouth 2 (two) times daily with a meal. (Patient taking differently: Take 12.5 mg by mouth 2 (two) times daily with a meal.) 180 tablet 3   empagliflozin (JARDIANCE) 10 MG TABS tablet Take 1 tablet (10 mg total) by mouth daily before breakfast. 30 tablet 5   furosemide (LASIX) 40 MG tablet Take 1 tablet (40 mg total) by mouth daily as needed. For swelling. 30 tablet 5   glimepiride (AMARYL) 1 MG tablet Take 1 mg by mouth daily with breakfast.     isosorbide mononitrate (IMDUR) 30 MG 24 hr tablet Take 1 tablet (30 mg total) by mouth daily. Please keep appointment on 12/05/2021 for future refills. 90 tablet 3   sacubitril-valsartan (ENTRESTO) 49-51 MG Take 1 tablet by mouth 2 (two) times daily. 180 tablet 3   spironolactone (ALDACTONE) 25 MG  tablet Take 1 tablet (25 mg total) by mouth daily. 90 tablet 3   No current facility-administered medications for this visit.   Vitals:   09/11/22 0953  BP: 123/61  Pulse: (!) 54  SpO2: 97%  Weight: 195 lb (88.5 kg)  Height: 5\' 2"  (1.575 m)   Wt Readings from Last 3 Encounters:  09/11/22 195 lb (88.5 kg)  07/31/22 205 lb 12.8 oz (93.4 kg)  06/28/22 202 lb (91.6 kg)   Lab Results  Component Value Date   CREATININE 1.65 (H) 03/27/2022   CREATININE 1.40 (H) 02/15/2022   CREATININE 1.42 (H) 12/26/2021   PHYSICAL EXAM:  General:  Well appearing. No resp difficulty HEENT: normal Neck: supple. JVP flat. No lymphadenopathy or thryomegaly appreciated. Cor: PMI normal. Regular rhythm. Bradycardia. No rubs, gallops or murmurs. Lungs: clear Abdomen: soft, nontender, nondistended. No hepatosplenomegaly. No bruits or masses.  Extremities: no cyanosis, clubbing, rash, edema Neuro: alert & oriented x3, cranial nerves grossly intact. Moves all 4 extremities w/o difficulty. Affect pleasant.   ECG: not done   ASSESSMENT & PLAN:  1: NICM with preserved ejection fraction- - HF likely due to HTN - NYHA class II - euvolemic today - weighing daily; reminded to call for an overnight weight gain of > 2 pounds or a weekly weight gain of > 5 pounds - weight down 10 pounds since last here 6 weeks ago - echo 05/03/21: EF of 60-65%.  - Echo 12/07/20: EF of 35-40% along with mild MR.  - not adding "much" salt and feels like she's doing better with her salt usage; emphasized the importance of not adding any salt to her food - continue carvedilol 12.5mg  BID (remains bradycardic although fatigue has improved) - continue entresto 49/51mg  BID - continue spironolactone 25mg  daily - continue jardiance 10mg  daily - continue isosorbide MN 30mg  daily - taking furosemide PRN - BNP 12/07/20 was 952.8  2: HTN-  - BP 123/61 - saw PCP Earlene Plater) 12/23; has f/u appt scheduled but she's unsure of exact date -  continue amlodipine 10mg  daily - BMP 03/27/22 reviewed and showed sodium 133, potassium 4.2, creatinine 1.65 and GFR 36 - check BMP today  3: DM- - A1c 03/27/22 was 12.8% - taking jardiance 10mg  daily although has been out of this for ~ 1 week; she is going to contact patient assistance company to f/u on this; samples provided by RN today - hasn't checked her glucose in "awhile" - check A1c today  4: CAD- - saw cardiology ([facility]) 01/23; f/u needs to be scheduled - LHC 12/10/20:  Prox  RCA to Mid RCA lesion is 40% stenosed.   Mid RCA to Dist RCA lesion is 30% stenosed.   Prox Cx to Mid Cx lesion is 40% stenosed.  1.  Mild to moderate nonobstructive coronary artery disease 2.  Left ventricular angiography was not performed.  EF was moderately reduced by echo. 3.  High normal left ventricular end-diastolic pressure of 12 mmHg.  Return in 2 months, sooner if needed.

## 2022-09-11 NOTE — ED Provider Triage Note (Signed)
Emergency Medicine Provider Triage Evaluation Note  Sharon Delgado , a 57 y.o. female  was evaluated in triage.  Pt complains of high glucose.Sent from heart clinic with glucose > 1000.  Patient not sure if she is on insulin.  Infrequently checks glucose at home.   Review of Systems  Positive: No complaints  Negative: No N/V  Physical Exam  BP (!) 154/60   Pulse 65   Temp 97.7 F (36.5 C)   Resp 18   SpO2 95%  Gen:   Awake, no distress  Alert and answering questions appropiately Resp:  Normal effort.  Clear bilaterally MSK:   Moves extremities without difficulty  Other:    Medical Decision Making  Medically screening exam initiated at 12:57 PM.  Appropriate orders placed.  Sharon Delgado was informed that the remainder of the evaluation will be completed by another provider, this initial triage assessment does not replace that evaluation, and the importance of remaining in the ED until their evaluation is complete.     Tommi Rumps, PA-C 09/11/22 1302

## 2022-09-11 NOTE — Progress Notes (Signed)
PHARMACIST - PHYSICIAN COMMUNICATION  CONCERNING:  Enoxaparin (Lovenox) for DVT Prophylaxis    RECOMMENDATION: Patient was prescribed enoxaprin 40mg  q24 hours for VTE prophylaxis.   There were no vitals filed for this visit.  There is no height or weight on file to calculate BMI.  Estimated Creatinine Clearance: 27.7 mL/min (A) (by C-G formula based on SCr of 2.29 mg/dL (H)).  Patient is candidate for enoxaparin 30mg  every 24 hours based on CrCl <6ml/min or Weight <45kg  DESCRIPTION: Pharmacy has adjusted enoxaparin dose per [facility] policy.  Patient is now receiving enoxaparin 30 mg every 24 hours    Foye Deer, PharmD Clinical Pharmacist  09/11/2022 2:42 PM

## 2022-09-11 NOTE — Patient Instructions (Signed)
Call the jardiance patient assistance company

## 2022-09-11 NOTE — ED Triage Notes (Signed)
Pt to ED From heart clinic for hyperglycemia. Results visible in epic glucose >1000. Pt not a good historian, states unsure if she is diabetic, unsure what medications she takes.

## 2022-09-12 ENCOUNTER — Other Ambulatory Visit (HOSPITAL_COMMUNITY): Payer: Self-pay

## 2022-09-12 DIAGNOSIS — E871 Hypo-osmolality and hyponatremia: Secondary | ICD-10-CM | POA: Insufficient documentation

## 2022-09-12 DIAGNOSIS — N1832 Chronic kidney disease, stage 3b: Secondary | ICD-10-CM

## 2022-09-12 DIAGNOSIS — N178 Other acute kidney failure: Secondary | ICD-10-CM

## 2022-09-12 DIAGNOSIS — E1110 Type 2 diabetes mellitus with ketoacidosis without coma: Principal | ICD-10-CM

## 2022-09-12 LAB — HEMOGLOBIN A1C
Hgb A1c MFr Bld: 14.5 % — ABNORMAL HIGH (ref 4.8–5.6)
Mean Plasma Glucose: 369 mg/dL

## 2022-09-12 LAB — BASIC METABOLIC PANEL
Anion gap: 11 (ref 5–15)
Anion gap: 12 (ref 5–15)
BUN: 43 mg/dL — ABNORMAL HIGH (ref 6–20)
BUN: 42 mg/dL — ABNORMAL HIGH (ref 6–20)
CO2: 21 mmol/L — ABNORMAL LOW (ref 22–32)
CO2: 22 mmol/L (ref 22–32)
Calcium: 8.9 mg/dL (ref 8.9–10.3)
Calcium: 8.9 mg/dL (ref 8.9–10.3)
Chloride: 102 mmol/L (ref 98–111)
Chloride: 103 mmol/L (ref 98–111)
Creatinine, Ser: 1.76 mg/dL — ABNORMAL HIGH (ref 0.44–1.00)
Creatinine, Ser: 1.67 mg/dL — ABNORMAL HIGH (ref 0.44–1.00)
GFR, Estimated: 33 mL/min — ABNORMAL LOW (ref >60)
GFR, Estimated: 35 mL/min — ABNORMAL LOW (ref >60)
Glucose, Bld: 210 mg/dL — ABNORMAL HIGH (ref 70–99)
Glucose, Bld: 96 mg/dL (ref 70–99)
Potassium: 3.3 mmol/L — ABNORMAL LOW (ref 3.5–5.1)
Potassium: 3.6 mmol/L (ref 3.5–5.1)
Sodium: 134 mmol/L — ABNORMAL LOW (ref 135–145)
Sodium: 137 mmol/L (ref 135–145)

## 2022-09-12 LAB — CBC
HCT: 33.9 % — ABNORMAL LOW (ref 36.0–46.0)
Hemoglobin: 11.8 g/dL — ABNORMAL LOW (ref 12.0–15.0)
MCH: 29.9 pg (ref 26.0–34.0)
MCHC: 34.8 g/dL (ref 30.0–36.0)
MCV: 85.8 fL (ref 80.0–100.0)
Platelets: 216 10*3/uL (ref 150–400)
RBC: 3.95 MIL/uL (ref 3.87–5.11)
RDW: 12.3 % (ref 11.5–15.5)
WBC: 12.3 10*3/uL — ABNORMAL HIGH (ref 4.0–10.5)
nRBC: 0 % (ref 0.0–0.2)

## 2022-09-12 LAB — GLUCOSE, CAPILLARY
Glucose-Capillary: 118 mg/dL — ABNORMAL HIGH (ref 70–99)
Glucose-Capillary: 131 mg/dL — ABNORMAL HIGH (ref 70–99)
Glucose-Capillary: 151 mg/dL — ABNORMAL HIGH (ref 70–99)
Glucose-Capillary: 208 mg/dL — ABNORMAL HIGH (ref 70–99)
Glucose-Capillary: 321 mg/dL — ABNORMAL HIGH (ref 70–99)
Glucose-Capillary: 87 mg/dL (ref 70–99)
Glucose-Capillary: 92 mg/dL (ref 70–99)
Glucose-Capillary: 158 mg/dL — ABNORMAL HIGH (ref 70–99)
Glucose-Capillary: 236 mg/dL — ABNORMAL HIGH (ref 70–99)
Glucose-Capillary: 316 mg/dL — ABNORMAL HIGH (ref 70–99)
Glucose-Capillary: 304 mg/dL — ABNORMAL HIGH (ref 70–99)

## 2022-09-12 LAB — HIV ANTIBODY (ROUTINE TESTING W REFLEX): HIV Screen 4th Generation wRfx: NONREACTIVE

## 2022-09-12 MED ORDER — INSULIN ASPART 100 UNIT/ML IJ SOLN
0.0000 [IU] | Freq: Every day | INTRAMUSCULAR | Status: DC
  Administered 2022-09-12: 3 [IU] via SUBCUTANEOUS
  Filled 2022-09-12: qty 1

## 2022-09-12 MED ORDER — INSULIN GLARGINE-YFGN 100 UNIT/ML ~~LOC~~ SOLN
12.0000 [IU] | Freq: Every day | SUBCUTANEOUS | Status: DC
  Administered 2022-09-12: 12 [IU] via SUBCUTANEOUS
  Filled 2022-09-12 (×2): qty 0.12

## 2022-09-12 MED ORDER — INSULIN ASPART 100 UNIT/ML IJ SOLN
0.0000 [IU] | Freq: Three times a day (TID) | INTRAMUSCULAR | Status: DC
  Administered 2022-09-12: 11 [IU] via SUBCUTANEOUS
  Administered 2022-09-12: 5 [IU] via SUBCUTANEOUS
  Administered 2022-09-13: 15 [IU] via SUBCUTANEOUS
  Administered 2022-09-13: 5 [IU] via SUBCUTANEOUS
  Administered 2022-09-13: 11 [IU] via SUBCUTANEOUS
  Administered 2022-09-14: 8 [IU] via SUBCUTANEOUS
  Filled 2022-09-12 (×6): qty 1

## 2022-09-12 NOTE — Hospital Course (Signed)
Sharon Delgado is a 57 y.o. female with medical history significant of hypertension, hyperlipidemia, diabetes mellitus, diastolic CHF, CKD-3A, former smoker, alcohol abuse (not drinking heavily anymore), obesity, who presents with abnormal lab with hyperglycemia.  Patient was placed on insulin drip, condition improved, transition to subcu insulin on 6/4.  6/5: Blood sugar running in 300s, switched her to insulin 70/30 15 units twice a day

## 2022-09-12 NOTE — Progress Notes (Addendum)
  Progress Note   Patient: Sharon Delgado:[id number] DOB: 04-07-1966 DOA: 09/11/2022     1 DOS: the patient was seen and examined on 09/12/2022   Brief hospital course:  Sharon Delgado is a 57 y.o. female with medical history significant of hypertension, hyperlipidemia, diabetes mellitus, diastolic CHF, CKD-3A, former smoker, alcohol abuse (not drinking heavily anymore), obesity, who presents with abnormal lab with hyperglycemia.  Patient was placed on insulin drip, condition improved, transition to subcu insulin on 6/4.   Principal Problem:   Hyperosmolar hyperglycemic state (HHS) (HCC) Active Problems:   Type II diabetes mellitus with renal manifestations (HCC)   HTN (hypertension)   HLD (hyperlipidemia)   Chronic diastolic CHF (congestive heart failure) (HCC)   Leukocytosis   Obesity (BMI 30-39.9)   CKD stage 3b, GFR 30-44 ml/min (HCC)   DKA, type 2, not at goal [facility])   Assessment and Plan: Uncontrolled type 2 diabetes with hyperosmolar hyperglycemic state. Diabetic ketoacidosis. Hemoglobin A1c 12.8.  Patient received IV insulin, glucose much better.  Transition to subcu insulin, continue to adjust dose as needed.  Patient may be able to discharge tomorrow follow-up with PCP as outpatient. Patient could not afford copay for basal  insulin, we will have to discharge with Humulin 70/30.  Essential hypertension Continue home medicines.  Chronic diastolic congestive heart failure Currently patient euvolemic, diuretics on hold.  Acute kidney failure on chronic kidney disease stage IIIb. Hyponatremia. Hypokalemia. This appears to be secondary to DKA.  Renal function has back to baseline, potassium normalized.  Will also check a magnesium tomorrow.  Obesity with BMI 34.68. Diet exercise advised.     Subjective:  Patient is doing much better today.  Tolerating diet without nausea vomiting.  Physical Exam: Vitals:   09/12/22 0701 09/12/22 0800 09/12/22 0900 09/12/22 0938   BP:  122/67 122/70 122/70  Pulse:  (!) 47 (!) 47   Resp:  16 14   Temp: (!) 97.5 F (36.4 C)     TempSrc: Oral     SpO2:  96% 96%   Weight:      Height:       General exam: Appears calm and comfortable  Respiratory system: Clear to auscultation. Respiratory effort normal. Cardiovascular system: S1 & S2 heard, RRR. No JVD, murmurs, rubs, gallops or clicks. No pedal edema. Gastrointestinal system: Abdomen is nondistended, soft and nontender. No organomegaly or masses felt. Normal bowel sounds heard. Central nervous system: Alert and oriented. No focal neurological deficits. Extremities: Symmetric 5 x 5 power. Skin: No rashes, lesions or ulcers Psychiatry: Judgement and insight appear normal. Mood & affect appropriate.    Data Reviewed:  Lab results reviewed.  Family Communication: None  Disposition: Status is: Inpatient Remains inpatient appropriate because: Severity of disease.     Time spent: 35 minutes  Author: Marrion Coy, MD 09/12/2022 10:23 AM  For on call review [email or web address].

## 2022-09-12 NOTE — Progress Notes (Signed)
Communicated with MD Mansy about CBGs in the 300s despite increasing infusion rate. D5LR stopped, diet changed back to ice chips. LR at 100 started. 1 Hr lated cbg was 208. Doctor [facility] agreed to no start D5LR back.

## 2022-09-12 NOTE — Inpatient Diabetes Management (Addendum)
Inpatient Diabetes Program Recommendations  AACE/ADA: New Consensus Statement on Inpatient Glycemic Control (2015)  Target Ranges:  Prepandial:   less than 140 mg/dL      Peak postprandial:   less than 180 mg/dL (1-2 hours)      Critically ill patients:  140 - 180 mg/dL    Latest Reference Range & Units 03/27/22 10:58  Hemoglobin A1C 4.8 - 5.6 % 12.8 (H)  321 mg/dl  (H): Data is abnormally high  Latest Reference Range & Units 09/11/22 10:56  Sodium 135 - 145 mmol/L 121 (L)  Potassium 3.5 - 5.1 mmol/L 4.8  Chloride 98 - 111 mmol/L 89 (L)  CO2 22 - 32 mmol/L 21 (L)  Glucose 70 - 99 mg/dL 0,981 (HH)  BUN 6 - 20 mg/dL 48 (H)  Creatinine 1.91 - 1.00 mg/dL 4.78 (H)  Calcium 8.9 - 10.3 mg/dL 9.1  Anion gap 5 - 15  11  GFR, Estimated >60 mL/min 25 (L)  (HH): Data is critically high (L): Data is abnormally low (H): Data is abnormally high  Latest Reference Range & Units 09/11/22 13:04 09/11/22 14:57 09/11/22 15:56 09/11/22 16:34 09/11/22 17:14 09/11/22 17:47 09/11/22 18:39 09/11/22 19:42 09/11/22 20:53  Glucose-Capillary 70 - 99 mg/dL >295 (HH) >621 (HH)  IV Insulin Drip Started @1526  541 (HH) 476 (H) 374 (H) 278 (H) 209 (H) 207 (H) 270 (H)    Latest Reference Range & Units 09/12/22 00:55 09/12/22 02:02 09/12/22 03:03 09/12/22 03:51 09/12/22 05:26 09/12/22 05:49 09/12/22 06:57 09/12/22 07:57 09/12/22 11:01  Glucose-Capillary 70 - 99 mg/dL 308 (H) 657 (H) 846 (H) 131 (H) 92 87 118 (H) 158 (H) 236 (H)  5 units Novolog  12 units Semglee @0937   (H): Data is abnormally high  Admit with: Hyperosmolar hyperglycemic state  Patient states that she is supposed to inject Trulicity every week, but stopped doing so 2 months ago.  She is still taking Jardiance.   History: DM (Diagnosed Dec 2023), CHF, CKD  Home DM Meds: Jardiance 10 mg daily (Out of med at home)        Trulicity?        Amaryl 1mg  Daily (NOT taking)  Current Orders: IV Insulin Drip   Established Care at [facility] on 04/05/2022 Was given Rxs for the following: Trulicity 0.75 mg Qweek Amaryl 1 mg daily Jardiance 10 mg daily     MD- Note 6:14am BMET shows the following: Glucose 96/ Anion Gap 12/ CO2 level 22  When pt allowed to transition to SQ Insulin, please consider:  1. Start Semglee 15 units Daily (~0.2 units/kg)  2. Start Novolog Moderate Correction Scale/ SSI (0-15 units) TID AC + HS  3. Please order current Hemoglobin A1c level    Addendum 11:15am--Met w/ pt at bedside.  Pt A&O and able to have meaningful conversation.  Pt told me she was diagnosed with diabetes in Dec 2023 at Cards office.  Stopped taking the Trulicity about 2-3 months b/c her Insurance does not cover the Trulicity anymore.  Have sent Secure Chat to Outpatient Pharmacy to see if an alternative like Ozempic is covered instead.  Was off her Jardiance for about 1 week due to running out of samples--Pt told me the Cards office gave her more samples yesterday (06/03) and she is applying for medication assistance for the Jardiance.  Pt stopped her Amaryl (Glimepiride) about 3 weeks ago b/c she ran out.  Did not contact her PCP for refills and did not  ask PCP office to check on alternative for Trulicity.  Pt told me she is currently seeking care under another PCP b/c her insurance does not cover her current PCP.    Pt told me she has CBG meter at home but checks rarely.  Encouraged pt to check her CBGs at lest BID (fasting Everyday and vary the 2nd check of the day either before another meal or 2 hrs after meals).  Reviewed goal A1c and goal CBGs for home.  Discussed with patient treatment for HHS, transition to SQ Insulin.  Discussed with pt that I will talk with the Attending MD to check on discharge medication plan.  Pt willing to use insulin if needed but it needs to be covered by her insurance.     --Will follow patient during hospitalization--  Ambrose Finland RN, MSN, CDCES Diabetes  Coordinator Inpatient Glycemic Control Team Team Pager: [phone] (8a-5p)

## 2022-09-12 NOTE — Inpatient Diabetes Management (Signed)
I contacted Pharmacy to check on meds for this pt and Pharmacy sent me the following info:   She has a Prior Authorization approval on file already for Trulicity but her copay is $932.56 due to a $4500.00 deductible, Ozempic not on formulary, Lantus Pen non formulary, Basaglar copay is 311.55 due to deductible.   I'm not sure she can afford the Trulicity or basal insulin.   If we send her home on insulin, we may have to go with [facility] Insulin 70/30 Insulin BID which is $45 for a box of 5 pens.      --Will follow patient during hospitalization--  Ambrose Finland RN, MSN, CDCES Diabetes Coordinator Inpatient Glycemic Control Team Team Pager: [phone] (8a-5p)

## 2022-09-13 DIAGNOSIS — E871 Hypo-osmolality and hyponatremia: Secondary | ICD-10-CM

## 2022-09-13 DIAGNOSIS — E1122 Type 2 diabetes mellitus with diabetic chronic kidney disease: Secondary | ICD-10-CM

## 2022-09-13 LAB — BASIC METABOLIC PANEL
Anion gap: 3 — ABNORMAL LOW (ref 5–15)
BUN: 42 mg/dL — ABNORMAL HIGH (ref 6–20)
CO2: 25 mmol/L (ref 22–32)
Calcium: 8.4 mg/dL — ABNORMAL LOW (ref 8.9–10.3)
Chloride: 102 mmol/L (ref 98–111)
Creatinine, Ser: 1.69 mg/dL — ABNORMAL HIGH (ref 0.44–1.00)
GFR, Estimated: 35 mL/min — ABNORMAL LOW (ref >60)
Glucose, Bld: 382 mg/dL — ABNORMAL HIGH (ref 70–99)
Potassium: 3.8 mmol/L (ref 3.5–5.1)
Sodium: 131 mmol/L — ABNORMAL LOW (ref 135–145)

## 2022-09-13 LAB — GLUCOSE, CAPILLARY
Glucose-Capillary: 354 mg/dL — ABNORMAL HIGH (ref 70–99)
Glucose-Capillary: 315 mg/dL — ABNORMAL HIGH (ref 70–99)
Glucose-Capillary: 208 mg/dL — ABNORMAL HIGH (ref 70–99)
Glucose-Capillary: 152 mg/dL — ABNORMAL HIGH (ref 70–99)

## 2022-09-13 LAB — PHOSPHORUS: Phosphorus: 3.7 mg/dL (ref 2.5–4.6)

## 2022-09-13 LAB — MAGNESIUM: Magnesium: 2.2 mg/dL (ref 1.7–2.4)

## 2022-09-13 MED ORDER — INSULIN ASPART PROT & ASPART (70-30 MIX) 100 UNIT/ML ~~LOC~~ SUSP
15.0000 [IU] | Freq: Two times a day (BID) | SUBCUTANEOUS | Status: DC
  Administered 2022-09-13 – 2022-09-14 (×3): 15 [IU] via SUBCUTANEOUS
  Filled 2022-09-13: qty 10

## 2022-09-13 MED ORDER — ENOXAPARIN SODIUM 40 MG/0.4ML IJ SOSY
40.0000 mg | PREFILLED_SYRINGE | INTRAMUSCULAR | Status: DC
  Administered 2022-09-13: 40 mg via SUBCUTANEOUS
  Filled 2022-09-13: qty 0.4

## 2022-09-13 NOTE — Progress Notes (Signed)
  Progress Note   Patient: Sharon Delgado JYN:[id number] DOB: 1965-07-09 DOA: 09/11/2022     2 DOS: the patient was seen and examined on 09/13/2022   Brief hospital course:  Sharon Delgado is a 57 y.o. female with medical history significant of hypertension, hyperlipidemia, diabetes mellitus, diastolic CHF, CKD-3A, former smoker, alcohol abuse (not drinking heavily anymore), obesity, who presents with abnormal lab with hyperglycemia.  Patient was placed on insulin drip, condition improved, transition to subcu insulin on 6/4.  6/5: Blood sugar running in 300s, switched her to insulin 70/30 15 units twice a day  Assessment and Plan:  Uncontrolled type 2 diabetes with hyperosmolar hyperglycemic state. Diabetic ketoacidosis. Hemoglobin A1c 12.8.  She is transitioned off insulin drip to subcu insulin Patient could not afford copay for basal  insulin, plan is to discharge with Humulin 70/30.  I have started on NovoLog 70/30 15 units subcu twice daily while here.  Blood sugar still running in 2 50-300.  Patient was not able to make new PCP appointment so we will hold off discharge today.  DM nurse following   Essential hypertension Continue home medicines.   Chronic diastolic congestive heart failure Currently patient euvolemic, diuretics on hold.   Acute kidney failure on chronic kidney disease stage IIIb. Hyponatremia. Hypokalemia. This appears to be secondary to DKA.  Renal function has back to baseline, potassium normalized.  Will also check a magnesium tomorrow.   Obesity with BMI 34.68. Diet exercise advised.       Subjective: She is feeling better and hoping to find a new PCP and get her blood sugar under better control  Physical Exam: Vitals:   09/13/22 0344 09/13/22 0345 09/13/22 0800 09/13/22 1622  BP:  127/64 129/74 115/61  Pulse:  (!) 48 64 (!) 57  Resp:  18  18  Temp: 98.1 F (36.7 C)  98.2 F (36.8 C)   TempSrc: Oral  Oral   SpO2:  96% 97% 97%  Weight:      Height:        General exam: Appears calm and comfortable  Respiratory system: Clear to auscultation. Respiratory effort normal. Cardiovascular system: S1 & S2 heard, RRR. No JVD, murmurs, rubs, gallops or clicks. No pedal edema. Gastrointestinal system: Abdomen is nondistended, soft and nontender. No organomegaly or masses felt. Normal bowel sounds heard. Central nervous system: Alert and oriented. No focal neurological deficits. Extremities: Symmetric 5 x 5 power. Skin: No rashes, lesions or ulcers Psychiatry: Judgement and insight appear normal. Mood & affect appropriate.  Data Reviewed:  Blood sugar range from 200-300  Family Communication: None at bedside  Disposition: Status is: Inpatient Remains inpatient appropriate because: Management of uncontrolled hyperglycemia  Planned Discharge Destination: Home   DVT prophylaxis-Lovenox Time spent: 35 minutes  Author: Delfino Lovett, MD 09/13/2022 4:47 PM  For on call review [email or web address].

## 2022-09-13 NOTE — TOC Initial Note (Signed)
Transition of Care [facility]) - Initial/Assessment Note    Patient Details  Name: Sharon Delgado MRN: [id number] Date of Birth: 1966/02/28  [facility] CM/SW Contact:    Chapman Fitch, RN Phone Number: 09/13/2022, 3:31 PM  Clinical Narrative:                   Met with patient at bedside Patient does not have PCP.  Patient has Building surveyor.  Provided patient with number to call, as well list of accepting mds local to her in [facility].  Patient to call today to arrange a new PCP appointment.  I have requested patient to notify bedside RN of time and date of her appointment         Patient Goals and CMS Choice            Expected Discharge Plan and Services                                              Prior Living Arrangements/Services                       Activities of Daily Living Home Assistive Devices/Equipment: Eyeglasses ADL Screening (condition at time of admission) Patient's cognitive ability adequate to safely complete daily activities?: Yes Is the patient deaf or have difficulty hearing?: No Does the patient have difficulty seeing, even when wearing glasses/contacts?: No Does the patient have difficulty concentrating, remembering, or making decisions?: No Patient able to express need for assistance with ADLs?: Yes Does the patient have difficulty dressing or bathing?: No Independently performs ADLs?: Yes (appropriate for developmental age) Does the patient have difficulty walking or climbing stairs?: No Weakness of Legs: None Weakness of Arms/Hands: None  Permission Sought/Granted                  Emotional Assessment              Admission diagnosis:  Hyperglycemia [R73.9] Hyperosmolar hyperglycemic state (HHS) (HCC) [E11.00] Patient Active Problem List   Diagnosis Date Noted   CKD stage 3b, GFR 30-44 ml/min (HCC) 09/12/2022   DKA, type 2, not at goal [facility]) 09/12/2022   Hyponatremia 09/12/2022    Acute renal failure superimposed on stage 3b chronic kidney disease (HCC) 09/12/2022   Hyperosmolar hyperglycemic state (HHS) (HCC) 09/11/2022   HTN (hypertension) 09/11/2022   HLD (hyperlipidemia) 09/11/2022   Type II diabetes mellitus with renal manifestations (HCC) 09/11/2022   Chronic diastolic CHF (congestive heart failure) (HCC) 09/11/2022   Obesity (BMI 30-39.9) 09/11/2022   Leukocytosis 09/11/2022   Hypertensive urgency    Shortness of breath 12/07/2020   Tobacco use 12/07/2020   Acute CHF (congestive heart failure) (HCC) 12/07/2020   Elevated troponin 12/07/2020   Acute exacerbation of CHF (congestive heart failure) (HCC) 12/07/2020   PCP:  Patient, No Pcp Per Pharmacy:   [id number] - [place], [place] - [address] [place] [address] Phone: [phone] Fax: [phone]     Social Determinants of Health (SDOH) Social History: SDOH Screenings   Food Insecurity: No Food Insecurity (09/11/2022)  Housing: Low Risk  (09/11/2022)  Transportation Needs: No Transportation Needs (09/11/2022)  Utilities: Not At Risk (09/11/2022)  Alcohol Screen: Low Risk  (07/15/2018)  Depression (PHQ2-9): Low Risk  (06/23/2021)  Tobacco Use: Medium  Risk (09/11/2022)   SDOH Interventions:     Readmission Risk Interventions     No data to display

## 2022-09-13 NOTE — Inpatient Diabetes Management (Addendum)
Inpatient Diabetes Program Recommendations  AACE/ADA: New Consensus Statement on Inpatient Glycemic Control (2015)  Target Ranges:  Prepandial:   less than 140 mg/dL      Peak postprandial:   less than 180 mg/dL (1-2 hours)      Critically ill patients:  140 - 180 mg/dL    Latest Reference Range & Units 03/27/22 10:58 09/11/22 10:56  Hemoglobin A1C 4.8 - 5.6 % 12.8 (H)  321 mg/dl 09.6 (H)  045 mg/dl  (H): Data is abnormally high  Latest Reference Range & Units 09/12/22 05:49 09/12/22 06:57 09/12/22 07:57 09/12/22 11:01 09/12/22 17:07 09/12/22 21:11  Glucose-Capillary 70 - 99 mg/dL 87 409 (H) 811 (H)  12 units Semglee @0937  236 (H)  5 units Novolog  316 (H)  11 units Novolog  304 (H)  3 units Novolog   (H): Data is abnormally high  Latest Reference Range & Units 09/13/22 08:01  Glucose-Capillary 70 - 99 mg/dL 914 (H)  15 units Novolog   (H): Data is abnormally high  Admit with: Hyperosmolar hyperglycemic state  Patient states that she is supposed to inject Trulicity every week, but stopped doing so 2 months ago.  She is still taking Jardiance.    History: DM (Diagnosed Dec 2023), CHF, CKD   Home DM Meds: Jardiance 10 mg daily (Out of med at home)                              Trulicity?                              Amaryl 1mg  Daily (NOT taking)   Current Orders: Semglee 12 units Daily      Novolog Moderate Correction Scale/ SSI (0-15 units) TID AC + HS     I contacted Pharmacy 6/4 to check on meds for this pt and Pharmacy sent me the following info:    She has a Prior Authorization approval on file already for Trulicity but her copay is $932.56 due to a $4500.00 deductible, Ozempic not on formulary, Lantus Pen non formulary, Basaglar copay is 311.55 due to deductible.    I'm not sure she can afford the Trulicity or basal insulin.    If we send her home on insulin, we may have to go with [facility] Insulin 70/30 Insulin BID which is $45 for a box of 5 pens.       MD- Please consider: Switch pt to 70/30 Insulin so we can better assess how much she will need for home:  Recommend we start with 70/30 Insulin 15 units BID with meals  This will give pt a total of 21 units longer-acting insulin and about 4.5 units fast-acting insulin with meals  Stop the Semglee Insulin   Addendum 11:15am--Met w/ pt at bedside to discuss going home on insulin.  Pt aware of this plan since MD already reviewed with her and is OK using insulin if needed.  Educated patient on insulin pen use at home.  Reviewed all steps of insulin pen including attachment of needle, 2-unit air shot, dialing up dose, giving injection, rotation of injection sites, removing needle, disposal of sharps, storage of unused insulin, disposal of insulin etc.  Patient able to provide successful return demonstration.  Reviewed troubleshooting with insulin pen.  Also reviewed Signs/Symptoms of Hypoglycemia with patient and how to treat Hypoglycemia at home.  Have asked RNs caring for  patient to please allow patient to give all injections here in hospital as much as possible for practice.  MD to give patient Rxs for insulin pens and insulin pen needles.  Also discussed with pt that since her insurance has a very high deductible, the MD plans to send her home on 70/30 Insulin BID with meals.  Explained what 70/30 Insulin is, how it works, when to give.  Also explained to pt that she can go to any [facility] and purchase this insulin for $45 for a box of 5 pens.  Pt also instructed that she can always purchase this insulin at [facility] out of pocket even without a Rx.  Pt stated understanding and appreciative of visit.  Have attached Insulin pen written instructions to AVS as well.     --Will follow patient during hospitalization--  Ambrose Finland RN, MSN, CDCES Diabetes Coordinator Inpatient Glycemic Control Team Team Pager: [phone] (8a-5p)

## 2022-09-13 NOTE — Progress Notes (Signed)
PHARMACIST - PHYSICIAN COMMUNICATION  CONCERNING:  Enoxaparin (Lovenox) for DVT Prophylaxis    RECOMMENDATION: Patient was prescribed enoxaparin 30mg  q24 hours for VTE prophylaxis.   Filed Weights   09/11/22 1808  Weight: 86 kg (189 lb 9.5 oz)    Body mass index is 34.68 kg/m.  Estimated Creatinine Clearance: 36.9 mL/min (A) (by C-G formula based on SCr of 1.69 mg/dL (H)).  Patient is candidate for enoxaparin 40mg  every 24 hours based on CrCl >9ml/min and Weight >45kg  DESCRIPTION: Pharmacy has adjusted enoxaparin dose per [facility] policy.  Patient is now receiving enoxaparin 40 mg every 24 hours   Tressie Ellis 09/13/2022 7:43 AM

## 2022-09-14 LAB — BASIC METABOLIC PANEL
Anion gap: 8 (ref 5–15)
BUN: 31 mg/dL — ABNORMAL HIGH (ref 6–20)
CO2: 24 mmol/L (ref 22–32)
Calcium: 8.6 mg/dL — ABNORMAL LOW (ref 8.9–10.3)
Chloride: 103 mmol/L (ref 98–111)
Creatinine, Ser: 1.55 mg/dL — ABNORMAL HIGH (ref 0.44–1.00)
GFR, Estimated: 39 mL/min — ABNORMAL LOW (ref >60)
Glucose, Bld: 162 mg/dL — ABNORMAL HIGH (ref 70–99)
Potassium: 4 mmol/L (ref 3.5–5.1)
Sodium: 135 mmol/L (ref 135–145)

## 2022-09-14 LAB — GLUCOSE, CAPILLARY
Glucose-Capillary: 262 mg/dL — ABNORMAL HIGH (ref 70–99)
Glucose-Capillary: 336 mg/dL — ABNORMAL HIGH (ref 70–99)

## 2022-09-14 LAB — CBC
HCT: 35 % — ABNORMAL LOW (ref 36.0–46.0)
Hemoglobin: 11.8 g/dL — ABNORMAL LOW (ref 12.0–15.0)
MCH: 29.3 pg (ref 26.0–34.0)
MCHC: 33.7 g/dL (ref 30.0–36.0)
MCV: 86.8 fL (ref 80.0–100.0)
Platelets: 208 10*3/uL (ref 150–400)
RBC: 4.03 MIL/uL (ref 3.87–5.11)
RDW: 12.4 % (ref 11.5–15.5)
WBC: 9.8 10*3/uL (ref 4.0–10.5)
nRBC: 0 % (ref 0.0–0.2)

## 2022-09-14 MED ORDER — LANCETS MISC: 1.0000 | Freq: Three times a day (TID) | 0 refills | Status: DC

## 2022-09-14 MED ORDER — PEN NEEDLES 31G X 8 MM MISC: 1.0000 | Freq: Three times a day (TID) | 0 refills | Status: DC

## 2022-09-14 MED ORDER — PEN NEEDLES 31G X 5 MM MISC: 1.0000 | Freq: Three times a day (TID) | 0 refills | Status: DC

## 2022-09-14 MED ORDER — BLOOD GLUCOSE MONITORING SUPPL DEVI: 1.0000 | Freq: Three times a day (TID) | 0 refills | Status: AC

## 2022-09-14 MED ORDER — NOVOLIN 70/30 FLEXPEN RELION (70-30) 100 UNIT/ML ~~LOC~~ SUPN: 20.0000 [IU] | PEN_INJECTOR | Freq: Two times a day (BID) | SUBCUTANEOUS | 0 refills | Status: DC

## 2022-09-14 MED ORDER — LANCETS MISC
1.0000 | Freq: Three times a day (TID) | 0 refills | Status: DC
Start: 2022-09-14 — End: 2022-09-14

## 2022-09-14 MED ORDER — BLOOD GLUCOSE MONITORING SUPPL DEVI
1.0000 | Freq: Three times a day (TID) | 0 refills | Status: DC
Start: 2022-09-14 — End: 2022-09-14

## 2022-09-14 MED ORDER — PEN NEEDLES 31G X 8 MM MISC
1.0000 | Freq: Three times a day (TID) | 0 refills | Status: DC
Start: 2022-09-14 — End: 2022-09-14

## 2022-09-14 MED ORDER — BLOOD GLUCOSE TEST VI STRP: 1.0000 | ORAL_STRIP | Freq: Three times a day (TID) | 0 refills | Status: DC

## 2022-09-14 MED ORDER — LANCET DEVICE MISC: 1.0000 | Freq: Three times a day (TID) | 0 refills | Status: AC

## 2022-09-14 MED ORDER — LANCET DEVICE MISC
1.0000 | Freq: Three times a day (TID) | 0 refills | Status: DC
Start: 2022-09-14 — End: 2022-09-14

## 2022-09-14 MED ORDER — NOVOLIN N FLEXPEN RELION 100 UNIT/ML ~~LOC~~ SUPN
20.0000 [IU] | PEN_INJECTOR | Freq: Two times a day (BID) | SUBCUTANEOUS | 0 refills | Status: DC
Start: 2022-09-14 — End: 2022-09-14

## 2022-09-14 MED ORDER — BLOOD GLUCOSE TEST VI STRP
1.0000 | ORAL_STRIP | Freq: Three times a day (TID) | 0 refills | Status: DC
Start: 2022-09-14 — End: 2022-09-14

## 2022-09-14 MED ORDER — BLOOD GLUCOSE MONITORING SUPPL DEVI: 1.0000 | Freq: Three times a day (TID) | 0 refills | Status: DC

## 2022-09-14 MED ORDER — INSULIN ASPART PROT & ASPART (70-30 MIX) 100 UNIT/ML PEN: 20.0000 [IU] | PEN_INJECTOR | Freq: Two times a day (BID) | SUBCUTANEOUS | 0 refills | Status: DC

## 2022-09-14 NOTE — Inpatient Diabetes Management (Signed)
Inpatient Diabetes Program Recommendations  AACE/ADA: New Consensus Statement on Inpatient Glycemic Control (2015)  Target Ranges:  Prepandial:   less than 140 mg/dL      Peak postprandial:   less than 180 mg/dL (1-2 hours)      Critically ill patients:  140 - 180 mg/dL   Lab Results  Component Value Date   GLUCAP 262 (H) 09/14/2022   HGBA1C 14.5 (H) 09/11/2022    Review of Glycemic Control  Latest Reference Range & Units 09/13/22 11:25 09/13/22 16:23 09/13/22 21:55 09/14/22 08:50  Glucose-Capillary 70 - 99 mg/dL 161 (H) 096 (H) 045 (H) 262 (H)   History: DM (Diagnosed Dec 2023), CHF, CKD   Home DM Meds: Jardiance 10 mg daily (Out of med at home)                              Trulicity?                              Amaryl 1mg  Daily (NOT taking) Current orders for Inpatient glycemic control:  Novolog 0-15 units tid with meals and HS Novolog 70/30 mis 15 units bid  Inpatient Diabetes Program Recommendations:   Note plans for discharge today.  A1C resulted and is very high.  Discharge Recommendations: Intermediate acting recommendations: insulin isophane & regular (NOVOLIN 70/30 RELION PEN) ([facility] Only) 20 units bid with breakfast and evening meal  Supply/Referral recommendations: Glucometer Pen needles - standard  Thanks,  Beryl Meager, RN, BC-ADM Inpatient Diabetes Coordinator Pager [phone]  (8a-5p)

## 2022-09-14 NOTE — Discharge Summary (Signed)
Physician Discharge Summary   Patient: Sharon Delgado MRN: [id number] DOB: 03/24/1966  Admit date:     09/11/2022  Discharge date: 09/14/22  Discharge Physician: Delfino Lovett   PCP: Octavia Heir, NP   Recommendations at discharge:    F/up with outpt providers as requested  Discharge Diagnoses: Principal Problem:   Hyperosmolar hyperglycemic state (HHS) (HCC) Active Problems:   Type II diabetes mellitus with renal manifestations (HCC)   HTN (hypertension)   HLD (hyperlipidemia)   Chronic diastolic CHF (congestive heart failure) (HCC)   Leukocytosis   Obesity (BMI 30-39.9)   CKD stage 3b, GFR 30-44 ml/min (HCC)   DKA, type 2, not at goal [facility])   Hyponatremia   Acute renal failure superimposed on stage 3b chronic kidney disease [facility])  Hospital Course:  Sharon Delgado is a 57 y.o. female with medical history significant of hypertension, hyperlipidemia, diabetes mellitus, diastolic CHF, CKD-3A, former smoker, alcohol abuse (not drinking heavily anymore), obesity, who presents with abnormal lab with hyperglycemia.  Patient was placed on insulin drip, condition improved, transition to subcu insulin on 6/4.  6/5: Blood sugar running in 300s, switched her to insulin 70/30 15 units twice a day  Assessment and Plan:  Uncontrolled type 2 diabetes with hyperosmolar hyperglycemic state. Diabetic ketoacidosis. Hemoglobin A1c 12.8.  She is transitioned off insulin drip to subcu insulin Patient could not afford copay for basal  insulin so discharging on NOVOLIN 70/30 RELION PEN) ([facility] Only) 20 units bid with breakfast and evening meal per DM nurse recommendation. Patient in agreement.   Essential hypertension Chronic diastolic congestive heart failure Acute kidney failure on chronic kidney disease stage IIIb - improved with hydration Hyponatremia - improved with hydration Hypokalemia. This appears to be secondary to DKA.  Renal function has back to baseline, potassium normalized.    Obesity with BMI 34.68. Diet exercise advised.         Disposition: Home Diet recommendation:  Discharge Diet Orders (From admission, onward)     Start     Ordered   09/14/22 0000  Diet - low sodium heart healthy        09/14/22 0934           Carb modified diet DISCHARGE MEDICATION: Allergies as of 09/14/2022   No Known Allergies      Medication List     STOP taking these medications    spironolactone 25 MG tablet Commonly known as: ALDACTONE       TAKE these medications    acetaminophen 500 MG tablet Commonly known as: TYLENOL Take 500 mg by mouth daily.   amLODipine 10 MG tablet Commonly known as: NORVASC TAKE 1 TABLET BY MOUTH EVERY DAY   aspirin EC 81 MG tablet Take 81 mg by mouth daily.   atorvastatin 40 MG tablet Commonly known as: LIPITOR Take 1 tablet (40 mg total) by mouth daily.   Blood Glucose Monitoring Suppl Devi 1 each by Does not apply route 3 (three) times daily. May dispense any manufacturer covered by patient's insurance.   BLOOD GLUCOSE TEST STRIPS Strp 1 each by Does not apply route 3 (three) times daily. Use as directed to check blood sugar. May dispense any manufacturer covered by patient's insurance and fits patient's device.   carvedilol 25 MG tablet Commonly known as: COREG Take 1 tablet (25 mg total) by mouth 2 (two) times daily with a meal. What changed: how much to take   empagliflozin 10 MG Tabs tablet Commonly known as: [place]  Take 1 tablet (10 mg total) by mouth daily before breakfast.   furosemide 40 MG tablet Commonly known as: LASIX Take 1 tablet (40 mg total) by mouth daily as needed. For swelling.   isosorbide mononitrate 30 MG 24 hr tablet Commonly known as: IMDUR Take 1 tablet (30 mg total) by mouth daily. Please keep appointment on 12/05/2021 for future refills.   Lancet Device Misc 1 each by Does not apply route 3 (three) times daily. May dispense any manufacturer covered by patient's  insurance.   Lancets Misc 1 each by Does not apply route 3 (three) times daily. Use as directed to check blood sugar. May dispense any manufacturer covered by patient's insurance and fits patient's device.   NovoLIN 70/30 Kwikpen (70-30) 100 UNIT/ML KwikPen Generic drug: insulin isophane & regular human KwikPen Inject 20 Units into the skin 2 (two) times daily with a meal.   Pen Needles 31G X 8 MM Misc 1 each by Does not apply route 3 (three) times daily. May dispense any manufacturer covered by patient's insurance.   sacubitril-valsartan 49-51 MG Commonly known as: ENTRESTO Take 1 tablet by mouth 2 (two) times daily.        Follow-up Information     Octavia Heir, NP. Go on 10/05/2022.   Specialty: Adult Health Nurse Practitioner Why: @ 9:00 AM as scheduled Contact information: [address] [place] [address] [phone]                Discharge Exam: Ceasar Mons Weights   09/11/22 1808  Weight: 86 kg   General exam: Appears calm and comfortable  Respiratory system: Clear to auscultation. Respiratory effort normal. Cardiovascular system: S1 & S2 heard, RRR. No JVD, murmurs, rubs, gallops or clicks. No pedal edema. Gastrointestinal system: Abdomen is nondistended, soft and nontender. No organomegaly or masses felt. Normal bowel sounds heard. Central nervous system: Alert and oriented. No focal neurological deficits. Extremities: Symmetric 5 x 5 power. Skin: No rashes, lesions or ulcers Psychiatry: Judgement and insight appear normal. Mood & affect appropriate.   Condition at discharge: good  The results of significant diagnostics from this hospitalization (including imaging, microbiology, ancillary and laboratory) are listed below for reference.   Imaging Studies: No results found.  Microbiology: Results for orders placed or performed during the hospital encounter of 09/11/22  MRSA Next Gen by PCR, Nasal     Status: None   Collection Time: 09/11/22  6:10 PM    Specimen: Nasal Mucosa; Nasal Swab  Result Value Ref Range Status   MRSA by PCR Next Gen NOT DETECTED NOT DETECTED Final    Comment: (NOTE) The GeneXpert MRSA Assay (FDA approved for NASAL specimens only), is one component of a comprehensive MRSA colonization surveillance program. It is not intended to diagnose MRSA infection nor to guide or monitor treatment for MRSA infections. Test performance is not FDA approved in patients less than 31 years old. Performed at [facility], [address] [place], [place] [address]     Labs: CBC: Recent Labs  Lab 09/11/22 1258 09/12/22 0614 09/14/22 0450  WBC 12.0* 12.3* 9.8  HGB 11.6* 11.8* 11.8*  HCT 36.8 33.9* 35.0*  MCV 92.9 85.8 86.8  PLT 196 216 208   Basic Metabolic Panel: Recent Labs  Lab 09/11/22 2219 09/12/22 0230 09/12/22 0614 09/13/22 0356 09/14/22 0450  NA 131* 134* 137 131* 135  K 4.1 3.3* 3.6 3.8 4.0  CL 99 102 103 102 103  CO2 21* 21* 22 25  24  GLUCOSE 362* 210* 96 382* 162*  BUN 44* 43* 42* 42* 31*  CREATININE 1.80* 1.76* 1.67* 1.69* 1.55*  CALCIUM 8.8* 8.9 8.9 8.4* 8.6*  MG  --   --   --  2.2  --   PHOS  --   --   --  3.7  --    Liver Function Tests: No results for input(s): "AST", "ALT", "ALKPHOS", "BILITOT", "PROT", "ALBUMIN" in the last 168 hours. CBG: Recent Labs  Lab 09/13/22 1125 09/13/22 1623 09/13/22 2155 09/14/22 0850 09/14/22 1124  GLUCAP 315* 208* 152* 262* 336*    Discharge time spent: greater than 30 minutes.  Signed: Delfino Lovett, MD [facility] 09/14/2022

## 2022-09-14 NOTE — Progress Notes (Signed)
Discharge education completed. IV's removed. Patient waiting on transportation (family)  Kem Boroughs

## 2022-09-14 NOTE — Plan of Care (Signed)
Adequate for discharge. Resolving plan of care.  Savon Bordonaro V Draeden Kellman  

## 2022-10-05 ENCOUNTER — Ambulatory Visit (INDEPENDENT_AMBULATORY_CARE_PROVIDER_SITE_OTHER): Payer: 59 | Admitting: Orthopedic Surgery

## 2022-10-05 ENCOUNTER — Encounter: Payer: Self-pay | Admitting: Orthopedic Surgery

## 2022-10-05 ENCOUNTER — Telehealth: Payer: Self-pay

## 2022-10-05 ENCOUNTER — Other Ambulatory Visit: Payer: Self-pay | Admitting: Orthopedic Surgery

## 2022-10-05 VITALS — BP 124/78 | HR 57 | Temp 97.9°F | Resp 17 | Ht 62.0 in | Wt 202.2 lb

## 2022-10-05 DIAGNOSIS — N1832 Chronic kidney disease, stage 3b: Secondary | ICD-10-CM | POA: Diagnosis not present

## 2022-10-05 DIAGNOSIS — E1122 Type 2 diabetes mellitus with diabetic chronic kidney disease: Secondary | ICD-10-CM | POA: Diagnosis not present

## 2022-10-05 DIAGNOSIS — I5032 Chronic diastolic (congestive) heart failure: Secondary | ICD-10-CM | POA: Diagnosis not present

## 2022-10-05 DIAGNOSIS — E669 Obesity, unspecified: Secondary | ICD-10-CM

## 2022-10-05 DIAGNOSIS — I2510 Atherosclerotic heart disease of native coronary artery without angina pectoris: Secondary | ICD-10-CM

## 2022-10-05 DIAGNOSIS — Z794 Long term (current) use of insulin: Secondary | ICD-10-CM | POA: Diagnosis not present

## 2022-10-05 DIAGNOSIS — I10 Essential (primary) hypertension: Secondary | ICD-10-CM | POA: Diagnosis not present

## 2022-10-05 LAB — GLUCOSE, POCT (MANUAL RESULT ENTRY): POC Glucose: 158 mg/dl — AB (ref 70–99)

## 2022-10-05 MED ORDER — FREESTYLE LIBRE 3 READER DEVI: 1.0000 | Freq: Every day | 11 refills | Status: DC

## 2022-10-05 MED ORDER — FREESTYLE LIBRE 3 SENSOR MISC: 1.0000 | 11 refills | Status: DC

## 2022-10-05 NOTE — Telephone Encounter (Signed)
Attempted to call patient to let her know to call her insurance to see what device for her Type 2 diabetes its covered. The Freestyle Josephine Igo is not covered by her insurance at this time. Patient voicemail is not currently set up.

## 2022-10-05 NOTE — Progress Notes (Signed)
Careteam: Patient Care Team: Octavia Heir, NP as PCP - General (Adult Health Nurse Practitioner) Debbe Odea, MD as PCP - Cardiology (Cardiology)  Seen by: Hazle Nordmann, AGNP-C  PLACE OF SERVICE:  [facility]  Advanced Directive information Does Patient Have a Medical Advance Directive?: No  No Known Allergies  Chief Complaint  Patient presents with   New Patient (Initial Visit)    Patient is here to establish care.     HPI: Patient is a 57 y.o. female seen today to establish with [facility].   Daughter present during encounter.   She did not have previous primary provider. [place] native. 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 Applied for disability, recently refused. Married. 4 children. Lives with daughter. Lives in single family home.   Denies MI and cancers.   T2DM- diagnosed with diabetes 03/2022. 6/03- 06/06 hospitalized for DKA, blood glucose > 1000.  She was on Trulicity for a short period of time prior to hospitalization, but stopped taking it. She is also on Jardiance. She is  checking sugars TID. No blood sugars under 70. Today's sugar 158.   CHF- hospitalized 12/07/2020- 12/12/2020 for shortness of breath> CHF new diagnosis> cardiac catheterization- mild to moderate nonobstructive CAD 12/30/2021. 2D echo LVEF 60-65% 04/2021.  She does weight herself daily. She tries to follow low sodium diet. Remains on furosemide prn and Entresto.   CAD- see above, remains on asa, carvedilol, atorvastatin, and IMDUR  HTN- remains on amlodipine and carvedilol  No past surgeries.   Colonoscopy> never done Mammogram> never done  Family history:  Father passed at age 74's> unknown  Mother age 2, T2DM, HTN  2 sisters> older sister has HTN and heart problems, younger sister had breast cancer  Maternal grandfather> 3 types of cancers  Maternal grandmother> HTN  Smoking: smoked cigarettes in late 30's and quit about 2 years ago Alcohol: drinks 2 beers in a  month, no past use or abuse Illicit drug use : no past use or abuse  Diet: drinks one mini can of soda daily, eats 3 meals daily, eats restaurant foods once weekly, tries to eats fruit/veg daily, grill/bake/air fryer for meats Exercise: no scheduled regimen, does chores in house  Dental exam: no recent exam Eye exam: does not have eye doctor  No living will or advanced directive.     Review of Systems:  Review of Systems  Constitutional:  Negative for chills and fever.  HENT:  Negative for congestion and sore throat.   Eyes:  Negative for blurred vision and double vision.  Respiratory:  Negative for cough, shortness of breath and wheezing.   Cardiovascular:  Negative for chest pain and leg swelling.  Gastrointestinal:  Negative for abdominal pain, blood in stool and constipation.  Genitourinary:  Negative for dysuria and frequency.  Musculoskeletal:  Negative for falls and joint pain.  Skin:  Negative for rash.  Neurological:  Negative for dizziness and headaches.  Endo/Heme/Allergies:  Positive for polydipsia.  Psychiatric/Behavioral:  Negative for depression. The patient is not nervous/anxious.     Past Medical History:  Diagnosis Date   CHF (congestive heart failure) (HCC)    Hypertension    Past Surgical History:  Procedure Laterality Date   LEFT HEART CATH AND CORONARY ANGIOGRAPHY N/A 12/10/2020   Procedure: LEFT HEART CATH AND CORONARY ANGIOGRAPHY;  Surgeon: Iran Ouch, MD;  Location: [facility] INVASIVE CV LAB;  Service: Cardiovascular;  Laterality: N/A;   NO PAST SURGERIES  Social History:   reports that she quit smoking about 21 months ago. Her smoking use included cigarettes and cigars. She has never used smokeless tobacco. She reports that she does not currently use alcohol after a past usage of about 15.0 standard drinks of alcohol per week. She reports that she does not use drugs.  Family History  Problem Relation Age of Onset   Diabetes Mother     Hypertension Mother    COPD Father    Alcoholism Father     Medications: Patient's Medications  New Prescriptions   No medications on file  Previous Medications   ACETAMINOPHEN (TYLENOL) 500 MG TABLET    Take 500 mg by mouth daily.   AMLODIPINE (NORVASC) 10 MG TABLET    TAKE 1 TABLET BY MOUTH EVERY DAY   ASPIRIN EC 81 MG TABLET    Take 81 mg by mouth daily.   ATORVASTATIN (LIPITOR) 40 MG TABLET    Take 1 tablet (40 mg total) by mouth daily.   BLOOD GLUCOSE MONITORING SUPPL DEVI    1 each by Does not apply route 3 (three) times daily. May dispense any manufacturer covered by patient's insurance.   CARVEDILOL (COREG) 25 MG TABLET    Take 1 tablet (25 mg total) by mouth 2 (two) times daily with a meal.   EMPAGLIFLOZIN (JARDIANCE) 10 MG TABS TABLET    Take 1 tablet (10 mg total) by mouth daily before breakfast.   FUROSEMIDE (LASIX) 40 MG TABLET    Take 1 tablet (40 mg total) by mouth daily as needed. For swelling.   GLUCOSE BLOOD (BLOOD GLUCOSE TEST STRIPS) STRP    1 each by Does not apply route 3 (three) times daily. Use as directed to check blood sugar. May dispense any manufacturer covered by patient's insurance and fits patient's device.   INSULIN ISOPHANE & REGULAR HUMAN KWIKPEN (NOVOLIN 70/30 KWIKPEN) (70-30) 100 UNIT/ML KWIKPEN    Inject 20 Units into the skin 2 (two) times daily with a meal.   INSULIN PEN NEEDLE (PEN NEEDLES) 31G X 8 MM MISC    1 each by Does not apply route 3 (three) times daily. May dispense any manufacturer covered by patient's insurance.   ISOSORBIDE MONONITRATE (IMDUR) 30 MG 24 HR TABLET    Take 1 tablet (30 mg total) by mouth daily. Please keep appointment on 12/05/2021 for future refills.   LANCET DEVICE MISC    1 each by Does not apply route 3 (three) times daily. May dispense any manufacturer covered by patient's insurance.   LANCETS MISC    1 each by Does not apply route 3 (three) times daily. Use as directed to check blood sugar. May dispense any manufacturer  covered by patient's insurance and fits patient's device.   SACUBITRIL-VALSARTAN (ENTRESTO) 49-51 MG    Take 1 tablet by mouth 2 (two) times daily.  Modified Medications   No medications on file  Discontinued Medications   No medications on file    Physical Exam:  Vitals:   10/05/22 0905  BP: 124/78  Pulse: (!) 57  Resp: 17  Temp: 97.9 F (36.6 C)  TempSrc: Temporal  SpO2: 97%  Weight: 202 lb 3.2 oz (91.7 kg)  Height: 5\' 2"  (1.575 m)   Body mass index is 36.98 kg/m. Wt Readings from Last 3 Encounters:  10/05/22 202 lb 3.2 oz (91.7 kg)  09/11/22 189 lb 9.5 oz (86 kg)  09/11/22 195 lb (88.5 kg)    Physical Exam Vitals reviewed.  Constitutional:      Appearance: She is obese.  HENT:     Head: Normocephalic.  Eyes:     General:        Right eye: No discharge.        Left eye: No discharge.  Cardiovascular:     Rate and Rhythm: Normal rate and regular rhythm.     Pulses: Normal pulses.     Heart sounds: Normal heart sounds.  Pulmonary:     Effort: Pulmonary effort is normal. No respiratory distress.     Breath sounds: Normal breath sounds. No wheezing.  Abdominal:     General: Bowel sounds are normal. There is no distension.     Palpations: Abdomen is soft.     Tenderness: There is no abdominal tenderness.  Musculoskeletal:     Cervical back: Neck supple.     Right lower leg: No edema.     Left lower leg: No edema.  Skin:    General: Skin is warm.     Capillary Refill: Capillary refill takes less than 2 seconds.  Neurological:     General: No focal deficit present.     Mental Status: She is alert and oriented to person, place, and time.  Psychiatric:        Mood and Affect: Mood normal.        Behavior: Behavior normal.    Labs reviewed: Basic Metabolic Panel: Recent Labs    09/12/22 0614 09/13/22 0356 09/14/22 0450  NA 137 131* 135  K 3.6 3.8 4.0  CL 103 102 103  CO2 22 25 24   GLUCOSE 96 382* 162*  BUN 42* 42* 31*  CREATININE 1.67* 1.69*  1.55*  CALCIUM 8.9 8.4* 8.6*  MG  --  2.2  --   PHOS  --  3.7  --    Liver Function Tests: No results for input(s): "AST", "ALT", "ALKPHOS", "BILITOT", "PROT", "ALBUMIN" in the last 8760 hours. No results for input(s): "LIPASE", "AMYLASE" in the last 8760 hours. No results for input(s): "AMMONIA" in the last 8760 hours. CBC: Recent Labs    09/11/22 1258 09/12/22 0614 09/14/22 0450  WBC 12.0* 12.3* 9.8  HGB 11.6* 11.8* 11.8*  HCT 36.8 33.9* 35.0*  MCV 92.9 85.8 86.8  PLT 196 216 208   Lipid Panel: No results for input(s): "CHOL", "HDL", "LDLCALC", "TRIG", "CHOLHDL", "LDLDIRECT" in the last 8760 hours. TSH: No results for input(s): "TSH" in the last 8760 hours. A1C: Lab Results  Component Value Date   HGBA1C 14.5 (H) 09/11/2022     Assessment/Plan 1. Type 2 diabetes mellitus with stage 3b chronic kidney disease, with long-term current use of insulin (HCC) - DKA hospitalization 06/03-06/06, ED blood glucose > 1000 - recent A1c 14.5 - started on insulin 70/30 BID> no hypoglycemias - also on Jardiance - no recent eye exam  - foot exam next encounter - POC Glucose (CBG) - Continuous Glucose Receiver (FREESTYLE LIBRE 3 READER) DEVI; 1 Device by Does not apply route daily.  Dispense: 1 each; Refill: 11 - Continuous Glucose Sensor (FREESTYLE LIBRE 3 SENSOR) MISC; 1 Device by Does not apply route every 14 (fourteen) days. Place 1 sensor on the skin every 14 days. Use to check glucose continuously  Dispense: 2 each; Refill: 11  2. Chronic diastolic CHF (congestive heart failure) (HCC) - hospitalized 12/2020 due to sob> new onset CHF - 2D echo 60-65% 04/2021 - no weight fluctuations, sob or edema - followed by cardiology - cont furosemide and Entresto  3. Primary hypertension - controlled - cont amlodipine and carvedilol  4. Coronary artery disease involving native coronary artery of native heart without angina pectoris - cardiac catheterization- mild to moderate  nonobstructive CAD 12/30/2021 - cont asa, atorvastatin, carvedilol and Imdur  5. Obesity (BMI 30-39.9) - BMI 36.98 - discussed exercise and low carb/calorie diet   6. CKD stage 3b, GFR 30-44 ml/min (HCC) - BUN/creat 31/1.55 09/14/2022, GFR 39   Future labs/tests: A1c, bmp, urine microalbumin, foot exam   Total time: 52 minutes. Greater than 50% of total time spent doing patient education regarding health maintenance, T2DM, CHF, HTN, CAD and weight loss/exercise.    Next appt: none Devaeh Amadi Scherry Ran  [facility] [phone]

## 2022-10-11 ENCOUNTER — Telehealth: Payer: Self-pay

## 2022-10-11 NOTE — Telephone Encounter (Signed)
Left message on voicemail for patient to return call when available   

## 2022-10-24 ENCOUNTER — Encounter: Payer: Self-pay | Admitting: Orthopedic Surgery

## 2022-10-25 MED ORDER — NOVOLIN 70/30 FLEXPEN RELION (70-30) 100 UNIT/ML ~~LOC~~ SUPN: 20.0000 [IU] | PEN_INJECTOR | Freq: Two times a day (BID) | SUBCUTANEOUS | 3 refills | Status: DC

## 2022-10-25 NOTE — Telephone Encounter (Signed)
Medication in the picture slightly varies from what is on the medication list. I will have provider confirm prior to refilling. RX pended.

## 2022-10-26 ENCOUNTER — Other Ambulatory Visit: Payer: Self-pay | Admitting: Family

## 2022-10-26 ENCOUNTER — Other Ambulatory Visit: Payer: Self-pay | Admitting: Orthopedic Surgery

## 2022-10-26 NOTE — Telephone Encounter (Signed)
  Pharmacy comment: Script Clarification:PT HAS AND EXPECTS TO STAY ON NOVOLOG (LOG), NOT NOVOLIN (LIN). PLEASE SEND RX FOR NOVOLOG, OR EXPLAIN TO PATIENT AND PHARMACY REGARDING A THERAPY CHANGE. THANKS!

## 2022-10-26 NOTE — Telephone Encounter (Signed)
  Pharmacy comment: Alternative Requested:PT PREVIOUSLY ON NOVOLOG 70-30. PT NOT EXPECTING A CHANGE IN THERAPY. PLEASE CLARIFY WITH PHARMACY.

## 2022-11-09 ENCOUNTER — Encounter: Payer: Self-pay | Admitting: Orthopedic Surgery

## 2022-11-09 ENCOUNTER — Ambulatory Visit (INDEPENDENT_AMBULATORY_CARE_PROVIDER_SITE_OTHER): Payer: 59 | Admitting: Orthopedic Surgery

## 2022-11-09 VITALS — BP 136/74 | HR 54 | Temp 96.5°F | Resp 17 | Ht 62.0 in | Wt 210.4 lb

## 2022-11-09 DIAGNOSIS — Z1231 Encounter for screening mammogram for malignant neoplasm of breast: Secondary | ICD-10-CM | POA: Diagnosis not present

## 2022-11-09 DIAGNOSIS — I5032 Chronic diastolic (congestive) heart failure: Secondary | ICD-10-CM

## 2022-11-09 DIAGNOSIS — N1832 Chronic kidney disease, stage 3b: Secondary | ICD-10-CM

## 2022-11-09 DIAGNOSIS — E1122 Type 2 diabetes mellitus with diabetic chronic kidney disease: Secondary | ICD-10-CM

## 2022-11-09 DIAGNOSIS — E661 Drug-induced obesity: Secondary | ICD-10-CM

## 2022-11-09 DIAGNOSIS — Z794 Long term (current) use of insulin: Secondary | ICD-10-CM

## 2022-11-09 DIAGNOSIS — Z6838 Body mass index (BMI) 38.0-38.9, adult: Secondary | ICD-10-CM

## 2022-11-09 NOTE — Progress Notes (Signed)
Careteam: Patient Care Team: Octavia Heir, NP as PCP - General (Adult Health Nurse Practitioner) Debbe Odea, MD as PCP - Cardiology (Cardiology)  Seen by: Hazle Nordmann, AGNP-C  PLACE OF SERVICE:  [facility]  Advanced Directive information Does Patient Have a Medical Advance Directive?: No, Would patient like information on creating a medical advance directive?: No - Patient declined  No Known Allergies  Chief Complaint  Patient presents with   Medical Management of Chronic Issues    4 week follow up.   Health Maintenance    Discuss the need for Eye exam, Hepatitis C Screening, Colonoscopy, Mammogram, Diabetic kidney evaluation.    Immunizations    Discuss the need for DTAP vaccine, Shingrix vaccine, and Covid Booster.     HPI: Patient is a 57 y.o. female seen today for medical management of chronic conditions.   She is taking 70/30 insulin as prescribed. Monitoring sugars with Freestyle Libre. Denies hypoglycemia. She has changed diet and limiting carbs/sweets. She reports feeling better since hospitalized for hyperglycemia 06/03. Diabetic foot exam performed today.   No weight fluctuations, sob or ankle edema. Remains on furosemide and Entresto.   Mammogram ordered.     Review of Systems:  Review of Systems  Constitutional: Negative.   HENT: Negative.    Eyes: Negative.   Respiratory:  Negative for cough, shortness of breath and wheezing.   Cardiovascular:  Negative for chest pain and leg swelling.  Gastrointestinal:  Negative for blood in stool, nausea and vomiting.  Genitourinary:  Negative for dysuria and hematuria.  Musculoskeletal:  Negative for falls and joint pain.  Skin: Negative.   Neurological:  Negative for dizziness, weakness and headaches.  Endo/Heme/Allergies:  Negative for polydipsia.  Psychiatric/Behavioral:  Negative for depression and memory loss.     Past Medical History:  Diagnosis Date   CHF (congestive heart failure) (HCC)     Hypertension    Past Surgical History:  Procedure Laterality Date   LEFT HEART CATH AND CORONARY ANGIOGRAPHY N/A 12/10/2020   Procedure: LEFT HEART CATH AND CORONARY ANGIOGRAPHY;  Surgeon: Iran Ouch, MD;  Location: [facility] INVASIVE CV LAB;  Service: Cardiovascular;  Laterality: N/A;   NO PAST SURGERIES     Social History:   reports that she quit smoking about 23 months ago. Her smoking use included cigarettes and cigars. She has never used smokeless tobacco. She reports that she does not currently use alcohol after a past usage of about 15.0 standard drinks of alcohol per week. She reports that she does not use drugs.  Family History  Problem Relation Age of Onset   Diabetes Mother    Hypertension Mother    COPD Father    Alcoholism Father     Medications: Patient's Medications  New Prescriptions   No medications on file  Previous Medications   ACETAMINOPHEN (TYLENOL) 500 MG TABLET    Take 500 mg by mouth daily.   ASPIRIN EC 81 MG TABLET    Take 81 mg by mouth daily.   ATORVASTATIN (LIPITOR) 40 MG TABLET    Take 1 tablet (40 mg total) by mouth daily.   BLOOD GLUCOSE MONITORING SUPPL DEVI    1 each by Does not apply route 3 (three) times daily. May dispense any manufacturer covered by patient's insurance.   CARVEDILOL (COREG) 25 MG TABLET    Take 0.5 tablets (12.5 mg total) by mouth 2 (two) times daily with a meal.   CONTINUOUS GLUCOSE RECEIVER (FREESTYLE LIBRE 3 READER)  DEVI    1 Device by Does not apply route daily.   CONTINUOUS GLUCOSE SENSOR (FREESTYLE LIBRE 3 SENSOR) MISC    1 Device by Does not apply route every 14 (fourteen) days. Place 1 sensor on the skin every 14 days. Use to check glucose continuously   EMPAGLIFLOZIN (JARDIANCE) 10 MG TABS TABLET    Take 1 tablet (10 mg total) by mouth daily before breakfast.   FUROSEMIDE (LASIX) 40 MG TABLET    Take 1 tablet (40 mg total) by mouth daily as needed. For swelling.   GLUCOSE BLOOD (BLOOD GLUCOSE TEST STRIPS) STRP    1 each  by Does not apply route 3 (three) times daily. Use as directed to check blood sugar. May dispense any manufacturer covered by patient's insurance and fits patient's device.   INSULIN ISOPHANE & REGULAR HUMAN KWIKPEN (NOVOLIN 70/30 KWIKPEN) (70-30) 100 UNIT/ML KWIKPEN    INJECT 20 UNITS INTO THE SKIN 2 (TWO) TIMES DAILY WITH A MEAL.   INSULIN PEN NEEDLE (PEN NEEDLES) 31G X 8 MM MISC    1 each by Does not apply route 3 (three) times daily. May dispense any manufacturer covered by patient's insurance.   ISOSORBIDE MONONITRATE (IMDUR) 30 MG 24 HR TABLET    Take 1 tablet (30 mg total) by mouth daily. Please keep appointment on 12/05/2021 for future refills.   LANCET DEVICE MISC    1 each by Does not apply route 3 (three) times daily. May dispense any manufacturer covered by patient's insurance.   LANCETS MISC    1 each by Does not apply route 3 (three) times daily. Use as directed to check blood sugar. May dispense any manufacturer covered by patient's insurance and fits patient's device.   SACUBITRIL-VALSARTAN (ENTRESTO) 49-51 MG    Take 1 tablet by mouth 2 (two) times daily.  Modified Medications   No medications on file  Discontinued Medications   AMLODIPINE (NORVASC) 10 MG TABLET    TAKE 1 TABLET BY MOUTH EVERY DAY    Physical Exam:  Vitals:   11/09/22 1007  BP: 136/74  Pulse: (!) 54  Resp: 17  Temp: (!) 96.5 F (35.8 C)  SpO2: 94%  Weight: 210 lb 6.4 oz (95.4 kg)  Height: 5\' 2"  (1.575 m)   Body mass index is 38.48 kg/m. Wt Readings from Last 3 Encounters:  11/09/22 210 lb 6.4 oz (95.4 kg)  10/05/22 202 lb 3.2 oz (91.7 kg)  09/11/22 189 lb 9.5 oz (86 kg)    Physical Exam Vitals reviewed.  Constitutional:      General: She is not in acute distress. HENT:     Head: Normocephalic.  Eyes:     General:        Right eye: No discharge.        Left eye: No discharge.  Cardiovascular:     Rate and Rhythm: Normal rate and regular rhythm.     Pulses:          Dorsalis pedis pulses  are 1+ on the right side and 1+ on the left side.       Posterior tibial pulses are 1+ on the right side and 1+ on the left side.     Heart sounds: Normal heart sounds.  Pulmonary:     Effort: Pulmonary effort is normal. No respiratory distress.     Breath sounds: Normal breath sounds. No wheezing or rales.  Abdominal:     General: Bowel sounds are normal. There is no distension.  Palpations: Abdomen is soft.     Tenderness: There is no abdominal tenderness.  Musculoskeletal:     Cervical back: Neck supple.     Right lower leg: No edema.     Left lower leg: No edema.     Right foot: Normal range of motion.     Left foot: Normal range of motion.  Feet:     Right foot:     Protective Sensation: 10 sites tested.  10 sites sensed.     Skin integrity: Dry skin present.     Toenail Condition: Fungal disease present.    Left foot:     Protective Sensation: 10 sites tested.  10 sites sensed.     Skin integrity: Dry skin present.     Toenail Condition: Fungal disease present. Skin:    General: Skin is warm.     Capillary Refill: Capillary refill takes less than 2 seconds.  Neurological:     General: No focal deficit present.     Mental Status: She is alert and oriented to person, place, and time.  Psychiatric:        Mood and Affect: Mood normal.     Labs reviewed: Basic Metabolic Panel: Recent Labs    09/12/22 0614 09/13/22 0356 09/14/22 0450  NA 137 131* 135  K 3.6 3.8 4.0  CL 103 102 103  CO2 22 25 24   GLUCOSE 96 382* 162*  BUN 42* 42* 31*  CREATININE 1.67* 1.69* 1.55*  CALCIUM 8.9 8.4* 8.6*  MG  --  2.2  --   PHOS  --  3.7  --    Liver Function Tests: No results for input(s): "AST", "ALT", "ALKPHOS", "BILITOT", "PROT", "ALBUMIN" in the last 8760 hours. No results for input(s): "LIPASE", "AMYLASE" in the last 8760 hours. No results for input(s): "AMMONIA" in the last 8760 hours. CBC: Recent Labs    09/11/22 1258 09/12/22 0614 09/14/22 0450  WBC 12.0* 12.3*  9.8  HGB 11.6* 11.8* 11.8*  HCT 36.8 33.9* 35.0*  MCV 92.9 85.8 86.8  PLT 196 216 208   Lipid Panel: No results for input(s): "CHOL", "HDL", "LDLCALC", "TRIG", "CHOLHDL", "LDLDIRECT" in the last 8760 hours. TSH: No results for input(s): "TSH" in the last 8760 hours. A1C: Lab Results  Component Value Date   HGBA1C 14.5 (H) 09/11/2022     Assessment/Plan 1. Encounter for screening mammogram for malignant neoplasm of breast - MM 3D SCREENING MAMMOGRAM BILATERAL BREAST  2. Type 2 diabetes mellitus with stage 3b chronic kidney disease, with long-term current use of insulin (HCC) - A1c 14.5 06/03 - now on 70/30 insulin - no hypoglycemias - recheck A1c today - discussed need for eye exam next visit - foot exam done today - Microalbumin/Creatinine Ratio, Urine - Basic Metabolic Panel with eGFR - Hemoglobin A1c  3. Chronic diastolic CHF (congestive heart failure) (HCC) - no weight fluctuations, sob, ankle edema - followed by cardiology - hospitalized 12/2020 due to sob> new onset CHF - 2D echo 60-65% 04/2021 - cont daily weights - cont low sodium diet - cont furosemide and Entresto  4. Class 2 drug-induced obesity with serious comorbidity and body mass index (BMI) of 38.0 to 38.9 in adult - BMI 38.48 - recommend limiting calories < 1500 per day - recommend exercise for 150 min/week  Total time: 32 minutes. Greater than 50% of total time spent doing patient education regarding health maintenance, T2DM, CHR and obesity including symptom/medication management.     Next appt: 02/15/2023  Ayoub Arey Scherry Ran  [facility] [phone]

## 2022-11-09 NOTE — Patient Instructions (Addendum)
Continue diet low in carbs and sugars  Please schedule mammogram with [facility]- [phone]

## 2022-11-10 ENCOUNTER — Other Ambulatory Visit: Payer: Self-pay | Admitting: Orthopedic Surgery

## 2022-11-10 DIAGNOSIS — Z794 Long term (current) use of insulin: Secondary | ICD-10-CM

## 2022-11-10 MED ORDER — NOVOLIN 70/30 FLEXPEN (70-30) 100 UNIT/ML ~~LOC~~ SUPN
22.0000 [IU] | PEN_INJECTOR | Freq: Two times a day (BID) | SUBCUTANEOUS | 6 refills | Status: DC
Start: 2022-11-10 — End: 2023-04-09

## 2022-11-15 NOTE — Progress Notes (Unsigned)
PCP: Hazle Nordmann, NP (last seen 08/24) Primary Cardiologist: Debbe Odea, MD (last seen 01/23)  HPI:  Sharon Delgado is a 57 y/o female with a history of HTN, DM, hyperlipidemia, CAD (LHC done 09/22), tobacco use and heart failure.   Admitted 09/11/22 due to hyperglycemia with glucose of 1028. Placed on insulin drip & home insulin regimen changed.   Echo 12/07/20: EF of 35-40% along with mild MR. Echo 05/03/21: EF of 60-65%.   LHC 12/10/20:  Prox RCA to Mid RCA lesion is 40% stenosed.   Mid RCA to Dist RCA lesion is 30% stenosed.   Prox Cx to Mid Cx lesion is 40% stenosed.  1.  Mild to moderate nonobstructive coronary artery disease 2.  Left ventricular angiography was not performed.  EF was moderately reduced by echo. 3.  High normal left ventricular end-diastolic pressure of 12 mmHg.   She presents today for a HF follow-up visit with a chief complaint of minimal fatigue with moderate exertion. Chronic in nature. Has no other symptoms and specifically denies any SOB, chest pain, palpitations, abdominal distention, pedal edema, dizziness or difficulty sleeping. Says that her home glucose varies from fasting of 109 to nonfasting of around 250. Has been out of jardiance for the last week.   Does note a gradual weight gain and says that it's because she's not doing any exercise and eating more than she should.   ROS: All systems negative except as listed in HPI, PMH and Problem List.  SH:  Social History   Socioeconomic History   Marital status: Single    Spouse name: Not on file   Number of children: Not on file   Years of education: Not on file   Highest education level: Not on file  Occupational History   Not on file  Tobacco Use   Smoking status: Former    Current packs/day: 0.00    Types: Cigarettes, Cigars    Quit date: 12/06/2020    Years since quitting: 1.9   Smokeless tobacco: Never  Vaping Use   Vaping status: Never Used  Substance and Sexual Activity   Alcohol use: Not  Currently    Alcohol/week: 15.0 standard drinks of alcohol    Types: 15 Cans of beer per week   Drug use: No   Sexual activity: Not on file  Other Topics Concern   Not on file  Social History Narrative   Not on file   Social Determinants of Health   Financial Resource Strain: Not on file  Food Insecurity: No Food Insecurity (09/11/2022)   Hunger Vital Sign    Worried About Running Out of Food in the Last Year: Never true    Ran Out of Food in the Last Year: Never true  Transportation Needs: No Transportation Needs (09/11/2022)   PRAPARE - Administrator, Civil Service (Medical): No    Lack of Transportation (Non-Medical): No  Physical Activity: Not on file  Stress: Not on file  Social Connections: Unknown (03/30/2022)   Received from [facility]   Social Network    Social Network: Not on file  Intimate Partner Violence: Not At Risk (09/11/2022)   Humiliation, Afraid, Rape, and Kick questionnaire    Fear of Current or Ex-Partner: No    Emotionally Abused: No    Physically Abused: No    Sexually Abused: No    FH:  Family History  Problem Relation Age of Onset   Diabetes Mother    Hypertension Mother  COPD Father    Alcoholism Father     Past Medical History:  Diagnosis Date   CHF (congestive heart failure) (HCC)    Hypertension     Current Outpatient Medications  Medication Sig Dispense Refill   acetaminophen (TYLENOL) 500 MG tablet Take 500 mg by mouth daily.     aspirin EC 81 MG tablet Take 81 mg by mouth daily.     atorvastatin (LIPITOR) 40 MG tablet Take 1 tablet (40 mg total) by mouth daily. 90 tablet 3   Blood Glucose Monitoring Suppl DEVI 1 each by Does not apply route 3 (three) times daily. May dispense any manufacturer covered by patient's insurance. 1 each 0   carvedilol (COREG) 25 MG tablet Take 0.5 tablets (12.5 mg total) by mouth 2 (two) times daily with a meal. 45 tablet 3   Continuous Glucose Receiver (FREESTYLE LIBRE 3  READER) DEVI 1 Device by Does not apply route daily. 1 each 11   Continuous Glucose Sensor (FREESTYLE LIBRE 3 SENSOR) MISC 1 Device by Does not apply route every 14 (fourteen) days. Place 1 sensor on the skin every 14 days. Use to check glucose continuously 2 each 11   empagliflozin (JARDIANCE) 10 MG TABS tablet Take 1 tablet (10 mg total) by mouth daily before breakfast. 30 tablet 5   furosemide (LASIX) 40 MG tablet Take 1 tablet (40 mg total) by mouth daily as needed. For swelling. 30 tablet 5   Glucose Blood (BLOOD GLUCOSE TEST STRIPS) STRP 1 each by Does not apply route 3 (three) times daily. Use as directed to check blood sugar. May dispense any manufacturer covered by patient's insurance and fits patient's device. 100 strip 0   insulin isophane & regular human KwikPen (NOVOLIN 70/30 KWIKPEN) (70-30) 100 UNIT/ML KwikPen Inject 22 Units into the skin 2 (two) times daily with a meal. 15 mL 6   Insulin Pen Needle (PEN NEEDLES) 31G X 8 MM MISC 1 each by Does not apply route 3 (three) times daily. May dispense any manufacturer covered by patient's insurance. 100 each 0   isosorbide mononitrate (IMDUR) 30 MG 24 hr tablet Take 1 tablet (30 mg total) by mouth daily. Please keep appointment on 12/05/2021 for future refills. 90 tablet 3   Lancet Device MISC 1 each by Does not apply route 3 (three) times daily. May dispense any manufacturer covered by patient's insurance. 1 each 0   Lancets MISC 1 each by Does not apply route 3 (three) times daily. Use as directed to check blood sugar. May dispense any manufacturer covered by patient's insurance and fits patient's device. 100 each 0   sacubitril-valsartan (ENTRESTO) 49-51 MG Take 1 tablet by mouth 2 (two) times daily. 180 tablet 3   No current facility-administered medications for this visit.   Vitals:   11/16/22 1053  BP: (!) 158/79  Pulse: (!) 54  SpO2: 100%  Weight: 212 lb (96.2 kg)  Height: 5\' 2"  (1.575 m)   Wt Readings from Last 3 Encounters:   11/16/22 212 lb (96.2 kg)  11/09/22 210 lb 6.4 oz (95.4 kg)  10/05/22 202 lb 3.2 oz (91.7 kg)   Lab Results  Component Value Date   CREATININE 1.28 (H) 11/09/2022   CREATININE 1.55 (H) 09/14/2022   CREATININE 1.69 (H) 09/13/2022   PHYSICAL EXAM:  General:  Well appearing. No resp difficulty HEENT: normal Neck: supple. JVP flat. No lymphadenopathy or thryomegaly appreciated. Cor: PMI normal. Regular rhythm. Bradycardia. No rubs, gallops or murmurs. Lungs: clear Abdomen:  soft, nontender, nondistended. No hepatosplenomegaly. No bruits or masses.  Extremities: no cyanosis, clubbing, rash, edema Neuro: alert & oriented x3, cranial nerves grossly intact. Moves all 4 extremities w/o difficulty. Affect pleasant.   ECG: not done   ASSESSMENT & PLAN:  1: NICM with preserved ejection fraction- - HF likely due to HTN - NYHA class II - euvolemic today - weighing daily; reminded to call for an overnight weight gain of > 2 pounds or a weekly weight gain of > 5 pounds - weight up 17 pounds since last here 2 months ago; she says that she's eating too much and not getting any exercise, encouraged her to be mindful of portion sizes and walk in place at home - Echo 12/07/20: EF of 35-40% along with mild MR.  - echo 05/03/21: EF of 60-65% - updated echo has been scheduled on 11/30/22 - not adding "much" salt and feels like she's doing better with her salt usage; emphasized the importance of not adding any salt to her food - continue carvedilol 12.5mg  BID  - continue entresto 49/51mg  BID - continue jardiance 10mg  daily; 1 month samples provided - continue isosorbide MN 30mg  daily - taking furosemide 40mg  PRN but says that she hasn't had to take this in "awhile" - BNP 12/07/20 was 952.8  2: HTN-  - BP 158/79; BP at PCP's office was better - saw PCP Sharon Delgado) 08/24 - BMP 11/09/22 reviewed and showed sodium 141, potassium 4.1, creatinine 1.28 and GFR 49  3: DM- - A1c 11/09/22 was 10.7% - taking  jardiance 10mg  daily although has been out of this for ~ 1 week; patient assistance paperwork filled out and pharmacist is going to check into her copay; 1 month samples provided - currently does not work and hasn't in "awhile"  4: CAD- - saw cardiology ([facility]) 01/23; f/u needs to be scheduled - LHC 12/10/20:  Prox RCA to Mid RCA lesion is 40% stenosed.   Mid RCA to Dist RCA lesion is 30% stenosed.   Prox Cx to Mid Cx lesion is 40% stenosed.  1.  Mild to moderate nonobstructive coronary artery disease 2.  Left ventricular angiography was not performed.  EF was moderately reduced by echo. 3.  High normal left ventricular end-diastolic pressure of 12 mmHg.  Return in 2 weeks after echo, sooner if needed.

## 2022-11-16 ENCOUNTER — Encounter: Payer: Self-pay | Admitting: Family

## 2022-11-16 ENCOUNTER — Ambulatory Visit: Payer: Self-pay | Attending: Family | Admitting: Family

## 2022-11-16 ENCOUNTER — Other Ambulatory Visit (HOSPITAL_COMMUNITY): Payer: Self-pay

## 2022-11-16 VITALS — BP 158/79 | HR 54 | Ht 62.0 in | Wt 212.0 lb

## 2022-11-16 DIAGNOSIS — E785 Hyperlipidemia, unspecified: Secondary | ICD-10-CM | POA: Insufficient documentation

## 2022-11-16 DIAGNOSIS — Z87891 Personal history of nicotine dependence: Secondary | ICD-10-CM | POA: Insufficient documentation

## 2022-11-16 DIAGNOSIS — I10 Essential (primary) hypertension: Secondary | ICD-10-CM

## 2022-11-16 DIAGNOSIS — I428 Other cardiomyopathies: Secondary | ICD-10-CM | POA: Insufficient documentation

## 2022-11-16 DIAGNOSIS — I5032 Chronic diastolic (congestive) heart failure: Secondary | ICD-10-CM | POA: Insufficient documentation

## 2022-11-16 DIAGNOSIS — I2510 Atherosclerotic heart disease of native coronary artery without angina pectoris: Secondary | ICD-10-CM | POA: Insufficient documentation

## 2022-11-16 DIAGNOSIS — E1122 Type 2 diabetes mellitus with diabetic chronic kidney disease: Secondary | ICD-10-CM

## 2022-11-16 DIAGNOSIS — E1165 Type 2 diabetes mellitus with hyperglycemia: Secondary | ICD-10-CM | POA: Insufficient documentation

## 2022-11-16 DIAGNOSIS — N1832 Chronic kidney disease, stage 3b: Secondary | ICD-10-CM

## 2022-11-16 DIAGNOSIS — I110 Hypertensive heart disease with heart failure: Secondary | ICD-10-CM | POA: Insufficient documentation

## 2022-11-20 ENCOUNTER — Other Ambulatory Visit (HOSPITAL_COMMUNITY): Payer: Self-pay

## 2022-11-20 ENCOUNTER — Telehealth (HOSPITAL_COMMUNITY): Payer: Self-pay

## 2022-11-20 NOTE — Telephone Encounter (Signed)
Advanced Heart Failure Patient Advocate Encounter  Prior authorization for [place] [employer] has been submitted and approved. Test billing returns $569.94 for 30 day supply.  Key: [id number] Effective: 11/18/22 to 11/17/2023.  Based on this deductible / copay, the patient may be eligible for manufacturer assistance through [facility]. Application forms have been started, attempted to contact patient for consent to apply, no answer, no voicemail available at this time. Will try to contact patient again.  Burnell Blanks, CPhT Rx Patient Advocate Phone: [phone]

## 2022-11-27 NOTE — Telephone Encounter (Signed)
Second attempt to contact patient regarding manufacturer assistance application. Voice mail box is full, unable to leave message at this time. Will try to contact patient again.

## 2022-11-29 ENCOUNTER — Other Ambulatory Visit (HOSPITAL_COMMUNITY): Payer: Self-pay

## 2022-11-30 ENCOUNTER — Encounter: Payer: Self-pay | Admitting: Family

## 2022-11-30 ENCOUNTER — Ambulatory Visit
Admission: RE | Admit: 2022-11-30 | Discharge: 2022-11-30 | Disposition: A | Discharge status: Home / Self Care | Payer: PRIVATE HEALTH INSURANCE | Source: Ambulatory Visit | Attending: Family | Admitting: Family

## 2022-11-30 ENCOUNTER — Encounter: Payer: Self-pay | Admitting: Pharmacy Technician

## 2022-11-30 ENCOUNTER — Ambulatory Visit: Payer: PRIVATE HEALTH INSURANCE | Admitting: Family

## 2022-11-30 VITALS — BP 162/76 | HR 62 | Ht 62.0 in | Wt 211.0 lb

## 2022-11-30 DIAGNOSIS — E1122 Type 2 diabetes mellitus with diabetic chronic kidney disease: Secondary | ICD-10-CM | POA: Diagnosis not present

## 2022-11-30 DIAGNOSIS — Z79899 Other long term (current) drug therapy: Secondary | ICD-10-CM | POA: Diagnosis not present

## 2022-11-30 DIAGNOSIS — Z794 Long term (current) use of insulin: Secondary | ICD-10-CM | POA: Diagnosis not present

## 2022-11-30 DIAGNOSIS — I428 Other cardiomyopathies: Secondary | ICD-10-CM | POA: Insufficient documentation

## 2022-11-30 DIAGNOSIS — I2510 Atherosclerotic heart disease of native coronary artery without angina pectoris: Secondary | ICD-10-CM | POA: Insufficient documentation

## 2022-11-30 DIAGNOSIS — E1165 Type 2 diabetes mellitus with hyperglycemia: Secondary | ICD-10-CM | POA: Insufficient documentation

## 2022-11-30 DIAGNOSIS — I5032 Chronic diastolic (congestive) heart failure: Secondary | ICD-10-CM | POA: Insufficient documentation

## 2022-11-30 DIAGNOSIS — N1832 Chronic kidney disease, stage 3b: Secondary | ICD-10-CM

## 2022-11-30 DIAGNOSIS — I110 Hypertensive heart disease with heart failure: Secondary | ICD-10-CM | POA: Diagnosis present

## 2022-11-30 DIAGNOSIS — I10 Essential (primary) hypertension: Secondary | ICD-10-CM

## 2022-11-30 DIAGNOSIS — Z7984 Long term (current) use of oral hypoglycemic drugs: Secondary | ICD-10-CM | POA: Insufficient documentation

## 2022-11-30 LAB — ECHOCARDIOGRAM COMPLETE
AR max vel: 2.48 cm2
AV Area VTI: 2.49 cm2
AV Area mean vel: 2.24 cm2
AV Mean grad: 6 mmHg
AV Peak grad: 13.7 mmHg
Ao pk vel: 1.85 m/s
Area-P 1/2: 1.96 cm2
MV VTI: 3.39 cm2
P 1/2 time: 792 msec
S' Lateral: 2.5 cm

## 2022-11-30 MED ORDER — SPIRONOLACTONE 25 MG PO TABS: 25.0000 mg | ORAL_TABLET | Freq: Every day | ORAL | 3 refills | Status: DC

## 2022-11-30 NOTE — Telephone Encounter (Signed)
Spoke with patients daughter by phone, pt agrees to apply for medication assistance. Application for Jardiance faxed to [facility] (along with proof of copay) on 11/30/22.

## 2022-11-30 NOTE — Progress Notes (Signed)
PCP: Hazle Nordmann, NP (last seen 08/24) Primary Cardiologist: Debbe Odea, MD (last seen 01/23)  HPI:  Ms Conrod is a 57 y/o female with a history of HTN, DM, hyperlipidemia, CAD (LHC done 09/22), tobacco use and heart failure.   Admitted 09/11/22 due to hyperglycemia with glucose of 1028. Placed on insulin drip & home insulin regimen changed.   Echo 12/07/20: EF of 35-40% along with mild MR. Echo 05/03/21: EF of 60-65%   LHC 12/10/20:  Prox RCA to Mid RCA lesion is 40% stenosed.   Mid RCA to Dist RCA lesion is 30% stenosed.   Prox Cx to Mid Cx lesion is 40% stenosed.  1.  Mild to moderate nonobstructive coronary artery disease 2.  Left ventricular angiography was not performed.  EF was moderately reduced by echo. 3.  High normal left ventricular end-diastolic pressure of 12 mmHg.   She presents today for a HF follow-up visit with a chief complaint of minimal fatigue with moderate exertion. Chronic in nature. Has associated intermittent dizziness along with this. Denies shortness of breath, chest pain, cough, palpitations, abdominal distention, pedal edema or difficulty sleeping. Had her echo done earlier today but no results to review at this time.  She says that she did take her medications this morning.   ROS: All systems negative except as listed in HPI, PMH and Problem List.  SH:  Social History   Socioeconomic History   Marital status: Single    Spouse name: Not on file   Number of children: Not on file   Years of education: Not on file   Highest education level: Not on file  Occupational History   Not on file  Tobacco Use   Smoking status: Former    Current packs/day: 0.00    Types: Cigarettes, Cigars    Quit date: 12/06/2020    Years since quitting: 1.9   Smokeless tobacco: Never  Vaping Use   Vaping status: Never Used  Substance and Sexual Activity   Alcohol use: Not Currently    Alcohol/week: 15.0 standard drinks of alcohol    Types: 15 Cans of beer per week    Drug use: No   Sexual activity: Not on file  Other Topics Concern   Not on file  Social History Narrative   Not on file   Social Determinants of Health   Financial Resource Strain: Not on file  Food Insecurity: No Food Insecurity (09/11/2022)   Hunger Vital Sign    Worried About Running Out of Food in the Last Year: Never true    Ran Out of Food in the Last Year: Never true  Transportation Needs: No Transportation Needs (09/11/2022)   PRAPARE - Administrator, Civil Service (Medical): No    Lack of Transportation (Non-Medical): No  Physical Activity: Not on file  Stress: Not on file  Social Connections: Unknown (03/30/2022)   Received from [facility]   Social Network    Social Network: Not on file  Intimate Partner Violence: Not At Risk (09/11/2022)   Humiliation, Afraid, Rape, and Kick questionnaire    Fear of Current or Ex-Partner: No    Emotionally Abused: No    Physically Abused: No    Sexually Abused: No    FH:  Family History  Problem Relation Age of Onset   Diabetes Mother    Hypertension Mother    COPD Father    Alcoholism Father     Past Medical History:  Diagnosis Date  CHF (congestive heart failure) (HCC)    Hypertension     Current Outpatient Medications  Medication Sig Dispense Refill   aspirin EC 81 MG tablet Take 81 mg by mouth daily.     atorvastatin (LIPITOR) 40 MG tablet Take 1 tablet (40 mg total) by mouth daily. 90 tablet 3   Blood Glucose Monitoring Suppl DEVI 1 each by Does not apply route 3 (three) times daily. May dispense any manufacturer covered by patient's insurance. 1 each 0   carvedilol (COREG) 25 MG tablet Take 0.5 tablets (12.5 mg total) by mouth 2 (two) times daily with a meal. 45 tablet 3   Continuous Glucose Receiver (FREESTYLE LIBRE 3 READER) DEVI 1 Device by Does not apply route daily. 1 each 11   Continuous Glucose Sensor (FREESTYLE LIBRE 3 SENSOR) MISC 1 Device by Does not apply route every 14  (fourteen) days. Place 1 sensor on the skin every 14 days. Use to check glucose continuously 2 each 11   empagliflozin (JARDIANCE) 10 MG TABS tablet Take 1 tablet (10 mg total) by mouth daily before breakfast. (Patient not taking: Reported on 11/16/2022) 30 tablet 5   furosemide (LASIX) 40 MG tablet Take 1 tablet (40 mg total) by mouth daily as needed. For swelling. 30 tablet 5   Glucose Blood (BLOOD GLUCOSE TEST STRIPS) STRP 1 each by Does not apply route 3 (three) times daily. Use as directed to check blood sugar. May dispense any manufacturer covered by patient's insurance and fits patient's device. 100 strip 0   insulin isophane & regular human KwikPen (NOVOLIN 70/30 KWIKPEN) (70-30) 100 UNIT/ML KwikPen Inject 22 Units into the skin 2 (two) times daily with a meal. 15 mL 6   Insulin Pen Needle (PEN NEEDLES) 31G X 8 MM MISC 1 each by Does not apply route 3 (three) times daily. May dispense any manufacturer covered by patient's insurance. 100 each 0   isosorbide mononitrate (IMDUR) 30 MG 24 hr tablet Take 1 tablet (30 mg total) by mouth daily. Please keep appointment on 12/05/2021 for future refills. 90 tablet 3   Lancet Device MISC 1 each by Does not apply route 3 (three) times daily. May dispense any manufacturer covered by patient's insurance. 1 each 0   Lancets MISC 1 each by Does not apply route 3 (three) times daily. Use as directed to check blood sugar. May dispense any manufacturer covered by patient's insurance and fits patient's device. 100 each 0   sacubitril-valsartan (ENTRESTO) 49-51 MG Take 1 tablet by mouth 2 (two) times daily. 180 tablet 3   No current facility-administered medications for this visit.   Vitals:   11/30/22 1044  BP: (!) 162/76  Pulse: 62  SpO2: 96%  Weight: 211 lb (95.7 kg)  Height: 5\' 2"  (1.575 m)   Wt Readings from Last 3 Encounters:  11/30/22 211 lb (95.7 kg)  11/16/22 212 lb (96.2 kg)  11/09/22 210 lb 6.4 oz (95.4 kg)   Lab Results  Component Value Date    CREATININE 1.28 (H) 11/09/2022   CREATININE 1.55 (H) 09/14/2022   CREATININE 1.69 (H) 09/13/2022   PHYSICAL EXAM:  General:  Well appearing. No resp difficulty HEENT: normal Neck: supple. JVP flat. No lymphadenopathy or thryomegaly appreciated. Cor: PMI normal. Regular rhythm. Bradycardia. No rubs, gallops or murmurs. Lungs: clear Abdomen: soft, nontender, nondistended. No hepatosplenomegaly. No bruits or masses.  Extremities: no cyanosis, clubbing, rash, edema Neuro: alert & oriented x3, cranial nerves grossly intact. Moves all 4 extremities w/o difficulty.  Affect pleasant.   ECG: not done   ASSESSMENT & PLAN:  1: NICM with preserved ejection fraction- - HF likely due to HTN as cath showed nonobstructive CAD - NYHA class II - euvolemic today - weighing daily; reminded to call for an overnight weight gain of > 2 pounds or a weekly weight gain of > 5 pounds - weight stable since last here 2 weeks ago - Echo 12/07/20: EF of 35-40% along with mild MR.  - echo 05/03/21: EF of 60-65% - updated echo done earlier today; no results yet - not adding "much" salt and feels like she's doing better with her salt usage; emphasized the importance of not adding any salt to her food - continue carvedilol 12.5mg  BID  - continue entresto 49/51mg  BID - continue jardiance 10mg  daily; 1 month samples provided - continue isosorbide MN 30mg  daily - continue furosemide 40mg  PRN but says that she hasn't had to take this in "awhile" - begin spironolactone 25mg  daily - BMP next week and then at next OV - BNP 12/07/20 was 952.8 - PharmD reconciled meds w/ patient  2: HTN-  - BP 162/76; adding spironolactone per above - saw PCP Coletta Memos) 08/24 - BMP 11/09/22 reviewed and showed sodium 141, potassium 4.1, creatinine 1.28 and GFR 49 - BMP in 1 week  3: DM- - A1c 11/09/22 was 10.7% - taking jardiance 10mg  daily  - glucose this morning was 154; has had some readings in the 60's - currently does not work  and hasn't in "awhile"  4: CAD- - saw cardiology (Agbor-Etang) 01/23; f/u needs to be scheduled - LHC 12/10/20:  Prox RCA to Mid RCA lesion is 40% stenosed.   Mid RCA to Dist RCA lesion is 30% stenosed.   Prox Cx to Mid Cx lesion is 40% stenosed.  1.  Mild to moderate nonobstructive coronary artery disease 2.  Left ventricular angiography was not performed.  EF was moderately reduced by echo. 3.  High normal left ventricular end-diastolic pressure of 12 mmHg.  Return in 3 weeks, sooner if needed.

## 2022-11-30 NOTE — Patient Instructions (Addendum)
Go to the [facility] building in [place] the end of the next week to get your lab work drawn  Start Spironolactone 25 mg (1 tablet) daily.   Labs done today, your results will be available in MyChart, we will contact you for abnormal readings.

## 2022-11-30 NOTE — Progress Notes (Signed)
*  PRELIMINARY RESULTS* Echocardiogram 2D Echocardiogram has been performed.  Sharon Delgado 11/30/2022, 9:46 AM

## 2022-11-30 NOTE — Progress Notes (Signed)
[name] - HEART FAILURE CLINIC - PHARMACIST COUNSELING NOTE  Guideline-Directed Medical Therapy/Evidence Based Medicine  ACE/ARB/ARNI: Sacubitril-valsartan 49-51 mg twice daily Beta Blocker: Carvedilol 12.5 mg twice daily Aldosterone Antagonist:  N/A Diuretic: Furosemide 40 mg PRN SGLT2i: Empagliflozin 10 mg daily  Adherence Assessment  Do you ever forget to take your medication? [] Yes [x] No  Do you ever skip doses due to side effects? [] Yes [x] No  Do you have trouble affording your medicines? [x] Yes [] No  Are you ever unable to pick up your medication due to transportation difficulties? [] Yes [x] No  Do you ever stop taking your medications because you don't believe they are helping? [] Yes [x] No  Do you check your weight daily? [] Yes [x] No   Adherence strategy: Pill box  Barriers to obtaining medications: Cost ($$$ medications -- Sherryll Burger and Jardiance, patient uses PAP programs) Authorizations on file until  Vital signs: HR 62, BP 162, weight (pounds) 211 ECHO: Date 11/30/2022, EF 55-60%, notes grade I DD     Latest Ref Rng & Units 11/09/2022   10:56 AM 09/14/2022    4:50 AM 09/13/2022    3:56 AM  BMP  Glucose 65 - 99 mg/dL 409  811  914   BUN 7 - 25 mg/dL 21  31  42   Creatinine 0.50 - 1.03 mg/dL 7.82  9.56  2.13   BUN/Creat Ratio 6 - 22 (calc) 16     Sodium 135 - 146 mmol/L 141  135  131   Potassium 3.5 - 5.3 mmol/L 4.1  4.0  3.8   Chloride 98 - 110 mmol/L 106  103  102   CO2 20 - 32 mmol/L 24  24  25    Calcium 8.6 - 10.4 mg/dL 9.3  8.6  8.4     Past Medical History:  Diagnosis Date   CHF (congestive heart failure) (HCC)    Hypertension     ASSESSMENT 57 year old female who presents to the HF clinic for follow-up. PMH includes HTN, CAD, and tobacco use. ECHO performed today, new LVEF is 55-60% which is down from prior reading on 60-65% in January 2023. Patient endorses increased headaches, for which uncontrolled BP may be contributing, as  well as Imdur.  Patient has not been able to secure access to [place]. She has an authorization on file to receive Jardiance from [facility] through Nov 17, 2023. [facility] would not fill it for me because patient has moved and they still have old address on file so I was not able to confirm her address which they requires. I called patient's daughter and asked her to call the company and request a refill. She was more than happy to do so.  Patient's BP is elevated today at 162/76. She reports it being 160 over 120 last night when she checked.  Recent ED Visit (past 6 months): Date - 09/11/2022, CC - Hyper[facility]  PLAN HFpEF (11/2022 EF 55-60%) Initiate spironolactone 25 mg daily BMP scheduled in one week Continue empagliflozin 10 mg daily, Entresto 49/51 mg twice daily, carvedilol 12.5 mg twice daily, and isosorbide mononitrate 30 mg daily. HLD 03/2022 LDL 112 Continue atorvastatin 40 mg daily   Will M. Dareen Piano, PharmD Clinical Pharmacist 11/30/2022 3:30 PM

## 2022-12-07 NOTE — Telephone Encounter (Signed)
Patient was approved to receive Jardiance from [facility] Effective 12/02/2022 to 04/10/2023  Shipment was processed on 8/26, left voicemail for patient.

## 2022-12-08 ENCOUNTER — Other Ambulatory Visit (HOSPITAL_COMMUNITY): Payer: Self-pay

## 2022-12-20 ENCOUNTER — Other Ambulatory Visit
Admission: RE | Admit: 2022-12-20 | Discharge: 2022-12-20 | Disposition: A | Discharge status: Home / Self Care | Payer: PRIVATE HEALTH INSURANCE | Source: Ambulatory Visit | Attending: Family | Admitting: Family

## 2022-12-20 ENCOUNTER — Other Ambulatory Visit
Admission: RE | Admit: 2022-12-20 | Discharge: 2022-12-20 | Disposition: A | Discharge status: Home / Self Care | Payer: 59 | Attending: Family | Admitting: Family

## 2022-12-20 DIAGNOSIS — I5032 Chronic diastolic (congestive) heart failure: Secondary | ICD-10-CM | POA: Insufficient documentation

## 2022-12-20 DIAGNOSIS — I509 Heart failure, unspecified: Secondary | ICD-10-CM | POA: Insufficient documentation

## 2022-12-20 LAB — BASIC METABOLIC PANEL
Anion gap: 9 (ref 5–15)
BUN: 32 mg/dL — ABNORMAL HIGH (ref 6–20)
CO2: 24 mmol/L (ref 22–32)
Calcium: 8.7 mg/dL — ABNORMAL LOW (ref 8.9–10.3)
Chloride: 103 mmol/L (ref 98–111)
Creatinine, Ser: 1.42 mg/dL — ABNORMAL HIGH (ref 0.44–1.00)
GFR, Estimated: 43 mL/min — ABNORMAL LOW (ref >60)
Glucose, Bld: 126 mg/dL — ABNORMAL HIGH (ref 70–99)
Potassium: 4.1 mmol/L (ref 3.5–5.1)
Sodium: 136 mmol/L (ref 135–145)

## 2022-12-21 ENCOUNTER — Ambulatory Visit: Payer: 59 | Attending: Family | Admitting: Family

## 2022-12-21 VITALS — BP 130/71 | HR 56 | Ht 62.0 in | Wt 213.0 lb

## 2022-12-21 DIAGNOSIS — E119 Type 2 diabetes mellitus without complications: Secondary | ICD-10-CM | POA: Insufficient documentation

## 2022-12-21 DIAGNOSIS — I509 Heart failure, unspecified: Secondary | ICD-10-CM | POA: Diagnosis present

## 2022-12-21 DIAGNOSIS — N1832 Chronic kidney disease, stage 3b: Secondary | ICD-10-CM

## 2022-12-21 DIAGNOSIS — I10 Essential (primary) hypertension: Secondary | ICD-10-CM

## 2022-12-21 DIAGNOSIS — I110 Hypertensive heart disease with heart failure: Secondary | ICD-10-CM | POA: Diagnosis present

## 2022-12-21 DIAGNOSIS — I428 Other cardiomyopathies: Secondary | ICD-10-CM | POA: Insufficient documentation

## 2022-12-21 DIAGNOSIS — I2510 Atherosclerotic heart disease of native coronary artery without angina pectoris: Secondary | ICD-10-CM | POA: Insufficient documentation

## 2022-12-21 DIAGNOSIS — Z87891 Personal history of nicotine dependence: Secondary | ICD-10-CM | POA: Insufficient documentation

## 2022-12-21 DIAGNOSIS — I5032 Chronic diastolic (congestive) heart failure: Secondary | ICD-10-CM | POA: Insufficient documentation

## 2022-12-21 DIAGNOSIS — E1122 Type 2 diabetes mellitus with diabetic chronic kidney disease: Secondary | ICD-10-CM | POA: Diagnosis not present

## 2022-12-21 DIAGNOSIS — R0683 Snoring: Secondary | ICD-10-CM | POA: Insufficient documentation

## 2022-12-21 DIAGNOSIS — E785 Hyperlipidemia, unspecified: Secondary | ICD-10-CM | POA: Insufficient documentation

## 2022-12-21 NOTE — Progress Notes (Signed)
PCP: Hazle Nordmann, NP (last seen 08/24) Primary Cardiologist: Debbe Odea, MD (last seen 01/23)  HPI:  Sharon Delgado is a 57 y/o female with a history of HTN, DM, hyperlipidemia, CAD (LHC done 09/22), tobacco use and heart failure.   Admitted 09/11/22 due to hyperglycemia with glucose of 1028. Placed on insulin drip & home insulin regimen changed.   Echo 12/07/20: EF of 35-40% along with mild MR. Echo 05/03/21: EF of 60-65%  Echo 11/30/22: EF 55-60% with Grade I DD, normal PA Pressure of 18.7 mmHg  LHC 12/10/20:  Prox RCA to Mid RCA lesion is 40% stenosed.   Mid RCA to Dist RCA lesion is 30% stenosed.   Prox Cx to Mid Cx lesion is 40% stenosed.  1.  Mild to moderate nonobstructive coronary artery disease 2.  Left ventricular angiography was not performed.  EF was moderately reduced by echo. 3.  High normal left ventricular end-diastolic pressure of 12 mmHg.   She presents today for a HF follow-up visit with a chief complaint of minimal SOB with moderate exertion. Chronic in nature. Has associated minimal fatigue, wheezing, snoring and apneic episodes (per her daughter). Denies chest pain, cough, palpitations, abdominal distention, pedal edema, dizziness or weight gain.   Reviewed recent echo and lab results with patient and her daughter.   ROS: All systems negative except as listed in HPI, PMH and Problem List.  SH:  Social History   Socioeconomic History   Marital status: Single    Spouse name: Not on file   Number of children: Not on file   Years of education: Not on file   Highest education level: Not on file  Occupational History   Not on file  Tobacco Use   Smoking status: Former    Current packs/day: 0.00    Types: Cigarettes, Cigars    Quit date: 12/06/2020    Years since quitting: 2.0   Smokeless tobacco: Never  Vaping Use   Vaping status: Never Used  Substance and Sexual Activity   Alcohol use: Not Currently    Alcohol/week: 15.0 standard drinks of alcohol    Types:  15 Cans of beer per week   Drug use: No   Sexual activity: Not on file  Other Topics Concern   Not on file  Social History Narrative   Not on file   Social Determinants of Health   Financial Resource Strain: Not on file  Food Insecurity: No Food Insecurity (09/11/2022)   Hunger Vital Sign    Worried About Running Out of Food in the Last Year: Never true    Ran Out of Food in the Last Year: Never true  Transportation Needs: No Transportation Needs (09/11/2022)   PRAPARE - Administrator, Civil Service (Medical): No    Lack of Transportation (Non-Medical): No  Physical Activity: Not on file  Stress: Not on file  Social Connections: Unknown (03/30/2022)   Received from [facility]   Social Network    Social Network: Not on file  Intimate Partner Violence: Not At Risk (09/11/2022)   Humiliation, Afraid, Rape, and Kick questionnaire    Fear of Current or Ex-Partner: No    Emotionally Abused: No    Physically Abused: No    Sexually Abused: No    FH:  Family History  Problem Relation Age of Onset   Diabetes Mother    Hypertension Mother    COPD Father    Alcoholism Father     Past Medical  History:  Diagnosis Date   CHF (congestive heart failure) (HCC)    Hypertension     Current Outpatient Medications  Medication Sig Dispense Refill   acetaminophen (TYLENOL) 500 MG tablet Take 500-1,000 mg by mouth 2 (two) times daily as needed for mild pain, moderate pain or headache.     aspirin EC 81 MG tablet Take 81 mg by mouth daily.     atorvastatin (LIPITOR) 40 MG tablet Take 1 tablet (40 mg total) by mouth daily. 90 tablet 3   Blood Glucose Monitoring Suppl DEVI 1 each by Does not apply route 3 (three) times daily. May dispense any manufacturer covered by patient's insurance. 1 each 0   carvedilol (COREG) 25 MG tablet Take 0.5 tablets (12.5 mg total) by mouth 2 (two) times daily with a meal. 45 tablet 3   Continuous Glucose Receiver (FREESTYLE LIBRE 3  READER) DEVI 1 Device by Does not apply route daily. 1 each 11   Continuous Glucose Sensor (FREESTYLE LIBRE 3 SENSOR) MISC 1 Device by Does not apply route every 14 (fourteen) days. Place 1 sensor on the skin every 14 days. Use to check glucose continuously 2 each 11   empagliflozin (JARDIANCE) 10 MG TABS tablet Take 1 tablet (10 mg total) by mouth daily before breakfast. 30 tablet 5   furosemide (LASIX) 40 MG tablet Take 1 tablet (40 mg total) by mouth daily as needed. For swelling. 30 tablet 5   Glucose Blood (BLOOD GLUCOSE TEST STRIPS) STRP 1 each by Does not apply route 3 (three) times daily. Use as directed to check blood sugar. May dispense any manufacturer covered by patient's insurance and fits patient's device. 100 strip 0   insulin isophane & regular human KwikPen (NOVOLIN 70/30 KWIKPEN) (70-30) 100 UNIT/ML KwikPen Inject 22 Units into the skin 2 (two) times daily with a meal. 15 mL 6   Insulin Pen Needle (PEN NEEDLES) 31G X 8 MM MISC 1 each by Does not apply route 3 (three) times daily. May dispense any manufacturer covered by patient's insurance. 100 each 0   isosorbide mononitrate (IMDUR) 30 MG 24 hr tablet Take 1 tablet (30 mg total) by mouth daily. Please keep appointment on 12/05/2021 for future refills. 90 tablet 3   Lancet Device MISC 1 each by Does not apply route 3 (three) times daily. May dispense any manufacturer covered by patient's insurance. 1 each 0   Lancets MISC 1 each by Does not apply route 3 (three) times daily. Use as directed to check blood sugar. May dispense any manufacturer covered by patient's insurance and fits patient's device. 100 each 0   sacubitril-valsartan (ENTRESTO) 49-51 MG Take 1 tablet by mouth 2 (two) times daily. 180 tablet 3   spironolactone (ALDACTONE) 25 MG tablet Take 1 tablet (25 mg total) by mouth daily. 90 tablet 3   No current facility-administered medications for this visit.   Vitals:   12/21/22 1119  BP: 130/71  Pulse: (!) 56  SpO2: 96%   Weight: 213 lb (96.6 kg)  Height: 5\' 2"  (1.575 m)   Wt Readings from Last 3 Encounters:  12/21/22 213 lb (96.6 kg)  11/30/22 211 lb (95.7 kg)  11/16/22 212 lb (96.2 kg)   Lab Results  Component Value Date   CREATININE 1.42 (H) 12/20/2022   CREATININE 1.28 (H) 11/09/2022   CREATININE 1.55 (H) 09/14/2022   PHYSICAL EXAM:  General:  Well appearing. No resp difficulty HEENT: normal Neck: supple. JVP flat. No lymphadenopathy or thryomegaly appreciated. Cor:  PMI normal. Regular rhythm. Bradycardia. No rubs, gallops or murmurs. Lungs: clear Abdomen: soft, nontender, nondistended. No hepatosplenomegaly. No bruits or masses.  Extremities: no cyanosis, clubbing, rash, edema Neuro: alert & oriented x3, cranial nerves grossly intact. Moves all 4 extremities w/o difficulty. Affect pleasant.   ECG: not done   ASSESSMENT & PLAN:  1: NICM with preserved ejection fraction- - HF likely due to HTN as cath showed nonobstructive CAD - NYHA class II - euvolemic today - weighing daily; reminded to call for an overnight weight gain of > 2 pounds or a weekly weight gain of > 5 pounds - weight up 2 pounds since last here 3 weeks ago - Echo 12/07/20: EF of 35-40% along with mild MR.  - Echo 05/03/21: EF of 60-65% - Echo 11/30/22: EF 55-60% with Grade I DD, normal PA Pressure of 18.7 mmHg - not adding "much" salt and feels like she's doing better with her salt usage; emphasized the importance of not adding any salt to her food - continue carvedilol 12.5mg  BID  - continue entresto 49/51mg  BID - continue jardiance 10mg  daily - continue isosorbide MN 30mg  daily - continue furosemide 40mg  PRN but says that she hasn't had to take this in "awhile" - continue spironolactone 25mg  daily - BNP 12/07/20 was 952.8  2: HTN-  - BP 130/71 - saw PCP Coletta Memos) 08/24 - BMP 12/20/22 reviewed and showed sodium 136, potassium 4.1, creatinine 1.42 and GFR 43 - repeat BMP in 2 weeks to check renal function  3: DM- -  A1c 11/09/22 was 10.7% - continue jardiance 10mg  daily  - glucose at home running 115-253 - currently does not work and hasn't in "awhile"  4: CAD- - saw cardiology ([facility]-Etang) 01/23; f/u needs to be scheduled - LHC 12/10/20:  Prox RCA to Mid RCA lesion is 40% stenosed.   Mid RCA to Dist RCA lesion is 30% stenosed.   Prox Cx to Mid Cx lesion is 40% stenosed.  1.  Mild to moderate nonobstructive coronary artery disease 2.  Left ventricular angiography was not performed.  EF was moderately reduced by echo. 3.  High normal left ventricular end-diastolic pressure of 12 mmHg.  5: Snoring- - + snoring and apneic episodes - will arrange for sleep study; daughter prefers patient to go to sleep lab instead of a home sleep study   Return in 2 months, sooner if needed.

## 2022-12-21 NOTE — Patient Instructions (Signed)
[facility] to the [facility]. Go pass the gift shop and have your blood work completed in 2 weeks.  Someone should reach out to you within a week for your sleep study referral. If not, you can call them.

## 2023-01-03 ENCOUNTER — Telehealth: Payer: Self-pay

## 2023-01-03 ENCOUNTER — Other Ambulatory Visit
Admission: RE | Admit: 2023-01-03 | Discharge: 2023-01-03 | Disposition: A | Discharge status: Home / Self Care | Payer: PRIVATE HEALTH INSURANCE | Attending: Family | Admitting: Family

## 2023-01-03 DIAGNOSIS — I5032 Chronic diastolic (congestive) heart failure: Secondary | ICD-10-CM | POA: Insufficient documentation

## 2023-01-03 LAB — BASIC METABOLIC PANEL
Anion gap: 7 (ref 5–15)
BUN: 31 mg/dL — ABNORMAL HIGH (ref 6–20)
CO2: 24 mmol/L (ref 22–32)
Calcium: 8.7 mg/dL — ABNORMAL LOW (ref 8.9–10.3)
Chloride: 104 mmol/L (ref 98–111)
Creatinine, Ser: 1.52 mg/dL — ABNORMAL HIGH (ref 0.44–1.00)
GFR, Estimated: 40 mL/min — ABNORMAL LOW (ref >60)
Glucose, Bld: 287 mg/dL — ABNORMAL HIGH (ref 70–99)
Potassium: 4 mmol/L (ref 3.5–5.1)
Sodium: 135 mmol/L (ref 135–145)

## 2023-01-03 NOTE — Telephone Encounter (Signed)
-----   Message from Delma Freeze sent at 01/03/2023  1:01 PM EDT ----- Potassium and sodium levels are normal. Kidney function a little worse but within your range. Make sure glucose is under control. Recheck BMP in 1 month

## 2023-01-16 ENCOUNTER — Telehealth: Payer: Self-pay | Admitting: Family

## 2023-01-16 ENCOUNTER — Telehealth: Payer: Self-pay

## 2023-01-16 ENCOUNTER — Other Ambulatory Visit: Payer: Self-pay

## 2023-01-16 MED ORDER — PEN NEEDLES 31G X 8 MM MISC: 1.0000 | Freq: Three times a day (TID) | 3 refills | Status: DC

## 2023-01-16 NOTE — Telephone Encounter (Signed)
Patient called for needle refills.

## 2023-01-21 ENCOUNTER — Other Ambulatory Visit: Payer: Self-pay | Admitting: Family

## 2023-02-05 ENCOUNTER — Other Ambulatory Visit: Payer: Self-pay | Admitting: Family

## 2023-02-15 ENCOUNTER — Encounter: Payer: Self-pay | Admitting: Orthopedic Surgery

## 2023-02-15 ENCOUNTER — Ambulatory Visit (INDEPENDENT_AMBULATORY_CARE_PROVIDER_SITE_OTHER): Payer: 59 | Admitting: Orthopedic Surgery

## 2023-02-15 DIAGNOSIS — Z1211 Encounter for screening for malignant neoplasm of colon: Secondary | ICD-10-CM

## 2023-02-15 DIAGNOSIS — E1122 Type 2 diabetes mellitus with diabetic chronic kidney disease: Secondary | ICD-10-CM

## 2023-02-15 DIAGNOSIS — N1832 Chronic kidney disease, stage 3b: Secondary | ICD-10-CM

## 2023-02-15 DIAGNOSIS — I5032 Chronic diastolic (congestive) heart failure: Secondary | ICD-10-CM

## 2023-02-15 DIAGNOSIS — Z794 Long term (current) use of insulin: Secondary | ICD-10-CM | POA: Diagnosis not present

## 2023-02-15 DIAGNOSIS — Z23 Encounter for immunization: Secondary | ICD-10-CM

## 2023-02-15 NOTE — Progress Notes (Signed)
Careteam: Patient Care Team: Octavia Heir, NP as PCP - General (Adult Health Nurse Practitioner) Debbe Odea, MD as PCP - Cardiology (Cardiology)  Seen by: Hazle Nordmann, AGNP-C  PLACE OF SERVICE:  [facility]  Advanced Directive information Does Patient Have a Medical Advance Directive?: No, Would patient like information on creating a medical advance directive?: No - Patient declined  No Known Allergies  Chief Complaint  Patient presents with   Medical Management of Chronic Issues    3 month follow up.    Health Maintenance    Discuss the need for Eye exam, Hepatitis C Screening, Papsmear, Colonoscopy, and Mammogram.   Immunizations    Discuss the need for DTAP vaccine, Shingrix vaccine, Influenza vaccine, and Covid Booster.     HPI: Patient is a 57 y.o. female seen today for medical management for chronic conditions.   Blood sugars averaging 150-300's. Denies hypoglycemia. No recent eye exam. She is taking 70/30 insulin as prescribed. Also on Jardiance. Not always following diabetic diet. Does not have eye doctor.   Followed by cardiology due to CHF. She denies home weight gain, sob or ankle edema. Remains on Entresto, spirolactone, furosemide, Imdur and carvedilol.   Flu vaccine given today.   No family h/o colon cancer. She has never had colonscopy. Agreeable to cologuard.   Flu vaccine given today.    Review of Systems:  Review of Systems  Constitutional: Negative.   HENT: Negative.    Eyes: Negative.   Respiratory:  Negative for cough, shortness of breath and wheezing.   Cardiovascular:  Negative for chest pain and leg swelling.  Gastrointestinal: Negative.   Genitourinary: Negative.   Musculoskeletal: Negative.   Skin: Negative.   Neurological: Negative.   Endo/Heme/Allergies:  Negative for polydipsia.  Psychiatric/Behavioral: Negative.      Past Medical History:  Diagnosis Date   CHF (congestive heart failure) (HCC)    Hypertension    Past  Surgical History:  Procedure Laterality Date   LEFT HEART CATH AND CORONARY ANGIOGRAPHY N/A 12/10/2020   Procedure: LEFT HEART CATH AND CORONARY ANGIOGRAPHY;  Surgeon: Iran Ouch, MD;  Location: [facility] INVASIVE CV LAB;  Service: Cardiovascular;  Laterality: N/A;   NO PAST SURGERIES     Social History:   reports that she quit smoking about 2 years ago. Her smoking use included cigarettes and cigars. She has never used smokeless tobacco. She reports that she does not currently use alcohol after a past usage of about 15.0 standard drinks of alcohol per week. She reports that she does not use drugs.  Family History  Problem Relation Age of Onset   Diabetes Mother    Hypertension Mother    COPD Father    Alcoholism Father     Medications: Patient's Medications  New Prescriptions   No medications on file  Previous Medications   ACETAMINOPHEN (TYLENOL) 500 MG TABLET    Take 500-1,000 mg by mouth 2 (two) times daily as needed for mild pain, moderate pain or headache.   ASPIRIN EC 81 MG TABLET    Take 81 mg by mouth daily.   ATORVASTATIN (LIPITOR) 40 MG TABLET    TAKE 1 TABLET BY MOUTH EVERY DAY   BLOOD GLUCOSE MONITORING SUPPL DEVI    1 each by Does not apply route 3 (three) times daily. May dispense any manufacturer covered by patient's insurance.   CARVEDILOL (COREG) 25 MG TABLET    Take 0.5 tablets (12.5 mg total) by mouth 2 (two) times  daily with a meal.   CONTINUOUS GLUCOSE RECEIVER (FREESTYLE LIBRE 3 READER) DEVI    1 Device by Does not apply route daily.   CONTINUOUS GLUCOSE SENSOR (FREESTYLE LIBRE 3 SENSOR) MISC    1 Device by Does not apply route every 14 (fourteen) days. Place 1 sensor on the skin every 14 days. Use to check glucose continuously   EMPAGLIFLOZIN (JARDIANCE) 10 MG TABS TABLET    Take 1 tablet (10 mg total) by mouth daily before breakfast.   FUROSEMIDE (LASIX) 40 MG TABLET    Take 1 tablet (40 mg total) by mouth daily as needed. For swelling.   GLUCOSE BLOOD (BLOOD  GLUCOSE TEST STRIPS) STRP    1 each by Does not apply route 3 (three) times daily. Use as directed to check blood sugar. May dispense any manufacturer covered by patient's insurance and fits patient's device.   INSULIN ISOPHANE & REGULAR HUMAN KWIKPEN (NOVOLIN 70/30 KWIKPEN) (70-30) 100 UNIT/ML KWIKPEN    Inject 22 Units into the skin 2 (two) times daily with a meal.   INSULIN PEN NEEDLE (PEN NEEDLES) 31G X 8 MM MISC    1 each by Does not apply route 3 (three) times daily. May dispense any manufacturer covered by patient's insurance.   ISOSORBIDE MONONITRATE (IMDUR) 30 MG 24 HR TABLET    TAKE 1 TABLET (30 MG TOTAL) BY MOUTH DAILY. PLEASE KEEP APPOINTMENT ON 12/05/2021 FOR FUTURE REFILLS.   LANCET DEVICE MISC    1 each by Does not apply route 3 (three) times daily. May dispense any manufacturer covered by patient's insurance.   LANCETS MISC    1 each by Does not apply route 3 (three) times daily. Use as directed to check blood sugar. May dispense any manufacturer covered by patient's insurance and fits patient's device.   SACUBITRIL-VALSARTAN (ENTRESTO) 49-51 MG    Take 1 tablet by mouth 2 (two) times daily.   SPIRONOLACTONE (ALDACTONE) 25 MG TABLET    Take 1 tablet (25 mg total) by mouth daily.  Modified Medications   No medications on file  Discontinued Medications   No medications on file    Physical Exam:  Vitals:   02/15/23 1010  BP: 118/62  Pulse: 74  Resp: 17  Temp: (!) 96.7 F (35.9 C)  SpO2: 90%  Weight: 218 lb 3.2 oz (99 kg)  Height: 5\' 2"  (1.575 m)   Body mass index is 39.91 kg/m. Wt Readings from Last 3 Encounters:  02/15/23 218 lb 3.2 oz (99 kg)  12/21/22 213 lb (96.6 kg)  11/30/22 211 lb (95.7 kg)    Physical Exam Vitals reviewed.  Constitutional:      General: She is not in acute distress. HENT:     Head: Normocephalic.  Eyes:     General:        Right eye: No discharge.        Left eye: No discharge.  Cardiovascular:     Rate and Rhythm: Normal rate and  regular rhythm.     Pulses: Normal pulses.     Heart sounds: Normal heart sounds.  Pulmonary:     Effort: Pulmonary effort is normal. No respiratory distress.     Breath sounds: Normal breath sounds. No wheezing or rales.  Abdominal:     General: Bowel sounds are normal.     Palpations: Abdomen is soft.  Musculoskeletal:     Cervical back: Neck supple.     Right lower leg: No edema.  Left lower leg: No edema.  Skin:    General: Skin is warm.     Capillary Refill: Capillary refill takes less than 2 seconds.  Neurological:     General: No focal deficit present.     Mental Status: She is alert and oriented to person, place, and time.  Psychiatric:        Mood and Affect: Mood normal.     Labs reviewed: Basic Metabolic Panel: Recent Labs    09/13/22 0356 09/14/22 0450 11/09/22 1056 12/20/22 1639 01/03/23 1235  NA 131*   < > 141 136 135  K 3.8   < > 4.1 4.1 4.0  CL 102   < > 106 103 104  CO2 25   < > 24 24 24   GLUCOSE 382*   < > 138* 126* 287*  BUN 42*   < > 21 32* 31*  CREATININE 1.69*   < > 1.28* 1.42* 1.52*  CALCIUM 8.4*   < > 9.3 8.7* 8.7*  MG 2.2  --   --   --   --   PHOS 3.7  --   --   --   --    < > = values in this interval not displayed.   Liver Function Tests: No results for input(s): "AST", "ALT", "ALKPHOS", "BILITOT", "PROT", "ALBUMIN" in the last 8760 hours. No results for input(s): "LIPASE", "AMYLASE" in the last 8760 hours. No results for input(s): "AMMONIA" in the last 8760 hours. CBC: Recent Labs    09/11/22 1258 09/12/22 0614 09/14/22 0450  WBC 12.0* 12.3* 9.8  HGB 11.6* 11.8* 11.8*  HCT 36.8 33.9* 35.0*  MCV 92.9 85.8 86.8  PLT 196 216 208   Lipid Panel: No results for input(s): "CHOL", "HDL", "LDLCALC", "TRIG", "CHOLHDL", "LDLDIRECT" in the last 8760 hours. TSH: No results for input(s): "TSH" in the last 8760 hours. A1C: Lab Results  Component Value Date   HGBA1C 10.7 (H) 11/09/2022     Assessment/Plan 1. Type 2 diabetes  mellitus with stage 3b chronic kidney disease, with long-term current use of insulin (HCC) - A1c 10.7 (08/01)> was 45.5 (06/03) - no hypoglycemias - home sugars 150-300's - does not have ophthalmologist - cont Jardiance and 70/30 insulin - Ambulatory referral to Ophthalmology - Hemoglobin A1c - CBC with Differential/Platelet - Complete Metabolic Panel with eGFR - Flu Vaccine Trivalent High Dose (Fluad)  2. Chronic diastolic CHF (congestive heart failure) (HCC) - followed by cardiology - appears compensated - LVEF 55-60% 11/2022 - cont Entresto, spirolactone, furosemide, Imdur, carvedilol  3. Screening for colon cancer - Cologuard  4. Need for influenza vaccination - Flu Vaccine Trivalent High Dose (Fluad)  Total time: 34 minutes. Greater than 50% of total time spent doing patient education regarding health maintenance, T2DM and CHF including symptom/medication management.   Next appt: Aliya Sol Norval Gable, NP  Shari Natt Scherry Ran  [facility] [phone]

## 2023-02-15 NOTE — Patient Instructions (Signed)
Cologuard will be sent to your house to screen for colon cancer  I will let you know lab work tomorrow  Limit carbs and sugars in diet  We need fasting lab work next visit to check cholesterol  Referral made for opthalmology> to check for diabetic retinopathy

## 2023-02-21 ENCOUNTER — Encounter: Payer: PRIVATE HEALTH INSURANCE | Admitting: Family

## 2023-03-06 NOTE — Progress Notes (Unsigned)
PCP: Hazle Nordmann, NP (last seen 11/24) Primary Cardiologist: Debbe Odea, MD (last seen 01/23)  HPI:  Sharon Delgado is a 57 y/o female with a history of HTN, DM, hyperlipidemia, CAD (LHC done 09/22), tobacco use and heart failure.   Admitted 09/11/22 due to hyperglycemia with glucose of 1028. Placed on insulin drip & home insulin regimen changed.   Echo 12/07/20: EF of 35-40% along with mild MR. Echo 05/03/21: EF of 60-65%  Echo 11/30/22: EF 55-60% with Grade I DD, normal PA Pressure of 18.7 mmHg  LHC 12/10/20:  Prox RCA to Mid RCA lesion is 40% stenosed.   Mid RCA to Dist RCA lesion is 30% stenosed.   Prox Cx to Mid Cx lesion is 40% stenosed.  1.  Mild to moderate nonobstructive coronary artery disease 2.  Left ventricular angiography was not performed.  EF was moderately reduced by echo. 3.  High normal left ventricular end-diastolic pressure of 12 mmHg.   She presents today for a HF follow-up visit with a chief complaint of minimal shortness of breath with moderate exertion. Chronic in nature. Has associated fatigue, dry cough and slight weight gain along with this. Has some buttock pain/ soreness as fell down inside steps at home twice in the last week. Had slippery socks on both times and thinks she just missed the step. No hitting the head or LOC. Denies chest pain, palpitations, abdominal distention, pedal edema, dizziness or difficultly sleeping. Reports sleeping well on 1 pillow.          She says that she hasn't heard from the sleep lab and isn't sure she has their number to call them.                        ROS: All systems negative except as listed in HPI, PMH and Problem List.  SH:  Social History   Socioeconomic History   Marital status: Single    Spouse name: Not on file   Number of children: Not on file   Years of education: Not on file   Highest education level: Not on file  Occupational History   Not on file  Tobacco Use   Smoking status: Former    Current  packs/day: 0.00    Types: Cigarettes, Cigars    Quit date: 12/06/2020    Years since quitting: 2.2   Smokeless tobacco: Never  Vaping Use   Vaping status: Never Used  Substance and Sexual Activity   Alcohol use: Not Currently    Alcohol/week: 15.0 standard drinks of alcohol    Types: 15 Cans of beer per week   Drug use: No   Sexual activity: Not on file  Other Topics Concern   Not on file  Social History Narrative   Not on file   Social Determinants of Health   Financial Resource Strain: Not on file  Food Insecurity: No Food Insecurity (09/11/2022)   Hunger Vital Sign    Worried About Running Out of Food in the Last Year: Never true    Ran Out of Food in the Last Year: Never true  Transportation Needs: No Transportation Needs (09/11/2022)   PRAPARE - Administrator, Civil Service (Medical): No    Lack of Transportation (Non-Medical): No  Physical Activity: Not on file  Stress: Not on file  Social Connections: Unknown (03/30/2022)   [facility] from [facility]   Social Network    Social Network: Not on file  Intimate Partner Violence: Not At Risk (09/11/2022)   Humiliation, Afraid, Rape, and Kick questionnaire    Fear of Current or Ex-Partner: No    Emotionally Abused: No    Physically Abused: No    Sexually Abused: No    FH:  Family History  Problem Relation Age of Onset   Diabetes Mother    Hypertension Mother    COPD Father    Alcoholism Father     Past Medical History:  Diagnosis Date   CHF (congestive heart failure) (HCC)    Hypertension     Current Outpatient Medications  Medication Sig Dispense Refill   acetaminophen (TYLENOL) 500 MG tablet Take 500-1,000 mg by mouth 2 (two) times daily as needed for mild pain, moderate pain or headache.     aspirin EC 81 MG tablet Take 81 mg by mouth daily.     atorvastatin (LIPITOR) 40 MG tablet TAKE 1 TABLET BY MOUTH EVERY DAY 90 tablet 3   Blood Glucose Monitoring Suppl DEVI 1 each by Does  not apply route 3 (three) times daily. May dispense any manufacturer covered by patient's insurance. 1 each 0   carvedilol (COREG) 25 MG tablet Take 0.5 tablets (12.5 mg total) by mouth 2 (two) times daily with a meal. 45 tablet 3   Continuous Glucose Receiver (FREESTYLE LIBRE 3 READER) DEVI 1 Device by Does not apply route daily. 1 each 11   Continuous Glucose Sensor (FREESTYLE LIBRE 3 SENSOR) MISC 1 Device by Does not apply route every 14 (fourteen) days. Place 1 sensor on the skin every 14 days. Use to check glucose continuously 2 each 11   empagliflozin (JARDIANCE) 10 MG TABS tablet Take 1 tablet (10 mg total) by mouth daily before breakfast. 30 tablet 5   furosemide (LASIX) 40 MG tablet Take 1 tablet (40 mg total) by mouth daily as needed. For swelling. 30 tablet 5   Glucose Blood (BLOOD GLUCOSE TEST STRIPS) STRP 1 each by Does not apply route 3 (three) times daily. Use as directed to check blood sugar. May dispense any manufacturer covered by patient's insurance and fits patient's device. 100 strip 0   insulin isophane & regular human KwikPen (NOVOLIN 70/30 KWIKPEN) (70-30) 100 UNIT/ML KwikPen Inject 22 Units into the skin 2 (two) times daily with a meal. 15 mL 6   Insulin Pen Needle (PEN NEEDLES) 31G X 8 MM MISC 1 each by Does not apply route 3 (three) times daily. May dispense any manufacturer covered by patient's insurance. 100 each 3   isosorbide mononitrate (IMDUR) 30 MG 24 hr tablet TAKE 1 TABLET (30 MG TOTAL) BY MOUTH DAILY. PLEASE KEEP APPOINTMENT ON 12/05/2021 FOR FUTURE REFILLS. 90 tablet 3   Lancet Device MISC 1 each by Does not apply route 3 (three) times daily. May dispense any manufacturer covered by patient's insurance. 1 each 0   Lancets MISC 1 each by Does not apply route 3 (three) times daily. Use as directed to check blood sugar. May dispense any manufacturer covered by patient's insurance and fits patient's device. 100 each 0   sacubitril-valsartan (ENTRESTO) 49-51 MG Take 1  tablet by mouth 2 (two) times daily. 180 tablet 3   spironolactone (ALDACTONE) 25 MG tablet Take 1 tablet (25 mg total) by mouth daily. 90 tablet 3   No current facility-administered medications for this visit.   Vitals:   03/07/23 0923  BP: 124/67  Pulse: 65  SpO2: 98%  Weight: 217 lb (98.4 kg)  Height: 5\' 2"  (1.575  m)   Wt Readings from Last 3 Encounters:  03/07/23 217 lb (98.4 kg)  02/15/23 218 lb 3.2 oz (99 kg)  12/21/22 213 lb (96.6 kg)   Lab Results  Component Value Date   CREATININE 1.52 (H) 01/03/2023   CREATININE 1.42 (H) 12/20/2022   CREATININE 1.28 (H) 11/09/2022   PHYSICAL EXAM:  General:  Well appearing. No resp difficulty HEENT: normal Neck: supple. JVP flat. No lymphadenopathy or thryomegaly appreciated. Cor: PMI normal. Regular rhythm, rate. No rubs, gallops or murmurs. Lungs: clear Abdomen: soft, nontender, nondistended. No hepatosplenomegaly. No bruits or masses.  Extremities: no cyanosis, clubbing, rash, edema Neuro: alert & oriented x3, cranial nerves grossly intact. Moves all 4 extremities w/o difficulty. Affect pleasant.   ECG: not done   ASSESSMENT & PLAN:  1: NICM with preserved ejection fraction- - HF likely due to HTN as cath showed nonobstructive CAD - NYHA class II - euvolemic today - weighing daily; reminded to call for an overnight weight gain of > 2 pounds or a weekly weight gain of > 5 pounds - weight up 4 pounds since last here 2.5 months ago - Echo 12/07/20: EF of 35-40% along with mild MR.  - Echo 05/03/21: EF of 60-65% - Echo 11/30/22: EF 55-60% with Grade I DD, normal PA Pressure of 18.7 mmHg - not adding "much" salt and feels like she's doing better with her salt usage; emphasized the importance of not adding any salt to her food - continue carvedilol 12.5mg  BID  - continue jardiance 10mg  daily - continue furosemide 40mg  PRN but says that she hasn't had to take this in "awhile" - continue isosorbide [place] 30mg  daily - continue  entresto 49/51mg  BID - continue spironolactone 25mg  daily - BNP 12/07/20 was 952.8  2: HTN-  - BP 124/67 - saw PCP Coletta Memos) 11/24 - BMP 01/03/23 reviewed and showed sodium 135, potassium 4.0, creatinine 1.52 and GFR 40  3: DM- - A1c 11/09/22 was 10.7% - continue jardiance 10mg  daily   4: CAD- - saw cardiology (Agbor-Etang) 01/23; f/u needs to be scheduled - continue atorvastatin 40mg  daily - LDL 04/05/22 was 112; recheck this at next visit - LHC 12/10/20:  Prox RCA to Mid RCA lesion is 40% stenosed.   Mid RCA to Dist RCA lesion is 30% stenosed.   Prox Cx to Mid Cx lesion is 40% stenosed.  1.  Mild to moderate nonobstructive coronary artery disease 2.  Left ventricular angiography was not performed.  EF was moderately reduced by echo. 3.  High normal left ventricular end-diastolic pressure of 12 mmHg.  5: Snoring- - + snoring and apneic episodes - sleep study has not been scheduled yet - sleep lab contact number provided to patient on her AVS   Return in 6 months, sooner if needed.

## 2023-03-07 ENCOUNTER — Ambulatory Visit: Payer: 59 | Attending: Family | Admitting: Family

## 2023-03-07 ENCOUNTER — Encounter: Payer: Self-pay | Admitting: Family

## 2023-03-07 VITALS — BP 124/67 | HR 65 | Ht 62.0 in | Wt 217.0 lb

## 2023-03-07 DIAGNOSIS — I2510 Atherosclerotic heart disease of native coronary artery without angina pectoris: Secondary | ICD-10-CM | POA: Diagnosis not present

## 2023-03-07 DIAGNOSIS — E785 Hyperlipidemia, unspecified: Secondary | ICD-10-CM | POA: Diagnosis present

## 2023-03-07 DIAGNOSIS — I110 Hypertensive heart disease with heart failure: Secondary | ICD-10-CM | POA: Insufficient documentation

## 2023-03-07 DIAGNOSIS — I5032 Chronic diastolic (congestive) heart failure: Secondary | ICD-10-CM | POA: Diagnosis not present

## 2023-03-07 DIAGNOSIS — E1122 Type 2 diabetes mellitus with diabetic chronic kidney disease: Secondary | ICD-10-CM

## 2023-03-07 DIAGNOSIS — Z87891 Personal history of nicotine dependence: Secondary | ICD-10-CM | POA: Diagnosis not present

## 2023-03-07 DIAGNOSIS — I10 Essential (primary) hypertension: Secondary | ICD-10-CM

## 2023-03-07 DIAGNOSIS — R0683 Snoring: Secondary | ICD-10-CM | POA: Diagnosis not present

## 2023-03-07 DIAGNOSIS — I428 Other cardiomyopathies: Secondary | ICD-10-CM | POA: Diagnosis not present

## 2023-03-07 DIAGNOSIS — E119 Type 2 diabetes mellitus without complications: Secondary | ICD-10-CM | POA: Diagnosis present

## 2023-03-07 DIAGNOSIS — N1832 Chronic kidney disease, stage 3b: Secondary | ICD-10-CM

## 2023-03-07 NOTE — Telephone Encounter (Signed)
Opened in error

## 2023-03-07 NOTE — Patient Instructions (Addendum)
Call the sleep study to get a sleep study scheduled. Below is the name of the clinic, their phone number and where they're located.  [facility] at [facility] [address], [place], [place] [phone]

## 2023-03-09 LAB — COLOGUARD: COLOGUARD: NEGATIVE

## 2023-04-09 ENCOUNTER — Other Ambulatory Visit: Payer: Self-pay | Admitting: Orthopedic Surgery

## 2023-04-09 DIAGNOSIS — Z794 Long term (current) use of insulin: Secondary | ICD-10-CM

## 2023-04-09 NOTE — Telephone Encounter (Signed)
High risk or very high risk warning populated when attempting to refill medication. RX request sent to PCP for review and approval if warranted.

## 2023-05-21 ENCOUNTER — Other Ambulatory Visit: Payer: 59

## 2023-05-22 ENCOUNTER — Other Ambulatory Visit: Payer: Self-pay | Admitting: Orthopedic Surgery

## 2023-05-22 DIAGNOSIS — N1832 Chronic kidney disease, stage 3b: Secondary | ICD-10-CM

## 2023-05-22 LAB — CBC WITH DIFFERENTIAL/PLATELET
Absolute Lymphocytes: 2264 {cells}/uL (ref 850–3900)
Absolute Monocytes: 632 {cells}/uL (ref 200–950)
Basophils Absolute: 32 {cells}/uL (ref 0–200)
Basophils Relative: 0.4 %
Eosinophils Absolute: 160 {cells}/uL (ref 15–500)
Eosinophils Relative: 2 %
HCT: 41.5 % (ref 35.0–45.0)
Hemoglobin: 12.9 g/dL (ref 11.7–15.5)
MCH: 28 pg (ref 27.0–33.0)
MCHC: 31.1 g/dL — ABNORMAL LOW (ref 32.0–36.0)
MCV: 90 fL (ref 80.0–100.0)
MPV: 12 fL (ref 7.5–12.5)
Monocytes Relative: 7.9 %
Neutro Abs: 4912 {cells}/uL (ref 1500–7800)
Neutrophils Relative %: 61.4 %
Platelets: 210 10*3/uL (ref 140–400)
RBC: 4.61 10*6/uL (ref 3.80–5.10)
RDW: 12.5 % (ref 11.0–15.0)
Total Lymphocyte: 28.3 %
WBC: 8 10*3/uL (ref 3.8–10.8)

## 2023-05-22 LAB — COMPLETE METABOLIC PANEL WITH GFR
AG Ratio: 1 (calc) (ref 1.0–2.5)
ALT: 28 U/L (ref 6–29)
AST: 21 U/L (ref 10–35)
Albumin: 4 g/dL (ref 3.6–5.1)
Alkaline phosphatase (APISO): 103 U/L (ref 37–153)
BUN/Creatinine Ratio: 16 (calc) (ref 6–22)
BUN: 29 mg/dL — ABNORMAL HIGH (ref 7–25)
CO2: 30 mmol/L (ref 20–32)
Calcium: 9.6 mg/dL (ref 8.6–10.4)
Chloride: 93 mmol/L — ABNORMAL LOW (ref 98–110)
Creat: 1.79 mg/dL — ABNORMAL HIGH (ref 0.50–1.03)
Globulin: 4 g/dL — ABNORMAL HIGH (ref 1.9–3.7)
Glucose, Bld: 604 mg/dL (ref 65–99)
Potassium: 4.9 mmol/L (ref 3.5–5.3)
Sodium: 131 mmol/L — ABNORMAL LOW (ref 135–146)
Total Bilirubin: 0.6 mg/dL (ref 0.2–1.2)
Total Protein: 8 g/dL (ref 6.1–8.1)
eGFR: 32 mL/min/{1.73_m2} — ABNORMAL LOW (ref >=60)

## 2023-05-22 LAB — HEMOGLOBIN A1C: Hgb A1c MFr Bld: >14 %{Hb} — ABNORMAL HIGH (ref <5.7)

## 2023-05-22 MED ORDER — NOVOLIN 70/30 FLEXPEN (70-30) 100 UNIT/ML ~~LOC~~ SUPN: 25.0000 [IU] | PEN_INJECTOR | Freq: Two times a day (BID) | SUBCUTANEOUS | 2 refills | Status: DC

## 2023-05-24 ENCOUNTER — Ambulatory Visit (INDEPENDENT_AMBULATORY_CARE_PROVIDER_SITE_OTHER): Payer: 59 | Admitting: Adult Health

## 2023-05-24 ENCOUNTER — Encounter: Payer: Self-pay | Admitting: Adult Health

## 2023-05-24 VITALS — BP 118/74 | HR 60 | Temp 97.1°F | Resp 20 | Ht 62.0 in | Wt 207.0 lb

## 2023-05-24 DIAGNOSIS — E782 Mixed hyperlipidemia: Secondary | ICD-10-CM

## 2023-05-24 DIAGNOSIS — Z794 Long term (current) use of insulin: Secondary | ICD-10-CM

## 2023-05-24 DIAGNOSIS — J9600 Acute respiratory failure, unspecified whether with hypoxia or hypercapnia: Secondary | ICD-10-CM | POA: Diagnosis not present

## 2023-05-24 DIAGNOSIS — E1165 Type 2 diabetes mellitus with hyperglycemia: Secondary | ICD-10-CM | POA: Diagnosis not present

## 2023-05-24 DIAGNOSIS — E871 Hypo-osmolality and hyponatremia: Secondary | ICD-10-CM

## 2023-05-24 DIAGNOSIS — N1832 Chronic kidney disease, stage 3b: Secondary | ICD-10-CM

## 2023-05-24 DIAGNOSIS — I5032 Chronic diastolic (congestive) heart failure: Secondary | ICD-10-CM

## 2023-05-24 DIAGNOSIS — E66812 Obesity, class 2: Secondary | ICD-10-CM

## 2023-05-24 DIAGNOSIS — Z6837 Body mass index (BMI) 37.0-37.9, adult: Secondary | ICD-10-CM

## 2023-05-24 MED ORDER — ALBUTEROL SULFATE HFA 108 (90 BASE) MCG/ACT IN AERS: 2.0000 | INHALATION_SPRAY | Freq: Four times a day (QID) | RESPIRATORY_TRACT | 0 refills | Status: DC | PRN

## 2023-05-24 MED ORDER — NOVOLIN 70/30 FLEXPEN (70-30) 100 UNIT/ML ~~LOC~~ SUPN: 26.0000 [IU] | PEN_INJECTOR | Freq: Two times a day (BID) | SUBCUTANEOUS | 2 refills | Status: DC

## 2023-05-24 NOTE — Progress Notes (Unsigned)
[facility] clinic  Provider:   Code Status: *** Goals of Care:     05/24/2023   10:21 AM  Advanced Directives  Does Patient Have a Medical Advance Directive? No  Would patient like information on creating a medical advance directive? No - Patient declined     Chief Complaint  Patient presents with   Medical Management of Chronic Issues    3 month follow up. Discuss the need for covid, pneumococcal, eye exam, hepatitis C, Tdap, cervical cancer, mammogram and shingles.    HPI: Patient is a 58 y.o. female seen today for an acute visit for  Past Medical History:  Diagnosis Date   CHF (congestive heart failure) (HCC)    Hypertension     Past Surgical History:  Procedure Laterality Date   LEFT HEART CATH AND CORONARY ANGIOGRAPHY N/A 12/10/2020   Procedure: LEFT HEART CATH AND CORONARY ANGIOGRAPHY;  Surgeon: Iran Ouch, MD;  Location: [facility] INVASIVE CV LAB;  Service: Cardiovascular;  Laterality: N/A;   NO PAST SURGERIES      No Known Allergies  Outpatient Encounter Medications as of 05/24/2023  Medication Sig   acetaminophen (TYLENOL) 500 MG tablet Take 500-1,000 mg by mouth 2 (two) times daily as needed for mild pain, moderate pain or headache.   aspirin EC 81 MG tablet Take 81 mg by mouth daily.   atorvastatin (LIPITOR) 40 MG tablet TAKE 1 TABLET BY MOUTH EVERY DAY   Blood Glucose Monitoring Suppl DEVI 1 each by Does not apply route 3 (three) times daily. May dispense any manufacturer covered by patient's insurance.   carvedilol (COREG) 25 MG tablet Take 0.5 tablets (12.5 mg total) by mouth 2 (two) times daily with a meal.   Continuous Glucose Receiver (FREESTYLE LIBRE 3 READER) DEVI 1 Device by Does not apply route daily.   Continuous Glucose Sensor (FREESTYLE LIBRE 3 SENSOR) MISC 1 Device by Does not apply route every 14 (fourteen) days. Place 1 sensor on the skin every 14 days. Use to check glucose continuously   empagliflozin (JARDIANCE) 10 MG TABS tablet Take 1 tablet  (10 mg total) by mouth daily before breakfast.   Glucose Blood (BLOOD GLUCOSE TEST STRIPS) STRP 1 each by Does not apply route 3 (three) times daily. Use as directed to check blood sugar. May dispense any manufacturer covered by patient's insurance and fits patient's device.   insulin isophane & regular human KwikPen (NOVOLIN 70/30 KWIKPEN) (70-30) 100 UNIT/ML KwikPen Inject 25 Units into the skin 2 (two) times daily with a meal.   Insulin Pen Needle (PEN NEEDLES) 31G X 8 MM MISC 1 each by Does not apply route 3 (three) times daily. May dispense any manufacturer covered by patient's insurance.   isosorbide mononitrate (IMDUR) 30 MG 24 hr tablet TAKE 1 TABLET (30 MG TOTAL) BY MOUTH DAILY. PLEASE KEEP APPOINTMENT ON 12/05/2021 FOR FUTURE REFILLS.   sacubitril-valsartan (ENTRESTO) 49-51 MG Take 1 tablet by mouth 2 (two) times daily.   spironolactone (ALDACTONE) 25 MG tablet Take 1 tablet (25 mg total) by mouth daily.   furosemide (LASIX) 40 MG tablet Take 1 tablet (40 mg total) by mouth daily as needed. For swelling. (Patient not taking: Reported on 05/24/2023)   Lancet Device MISC 1 each by Does not apply route 3 (three) times daily. May dispense any manufacturer covered by patient's insurance. (Patient not taking: Reported on 03/07/2023)   Lancets MISC 1 each by Does not apply route 3 (three) times daily. Use as directed to check  blood sugar. May dispense any manufacturer covered by patient's insurance and fits patient's device. (Patient not taking: Reported on 05/24/2023)   No facility-administered encounter medications on file as of 05/24/2023.    Review of Systems:  Review of Systems  Constitutional:  Negative for appetite change, chills, fatigue and fever.  HENT:  Negative for congestion, hearing loss, rhinorrhea and sore throat.   Eyes: Negative.   Respiratory:  Negative for cough, shortness of breath and wheezing.   Cardiovascular:  Negative for chest pain, palpitations and leg swelling.   Gastrointestinal:  Negative for abdominal pain, constipation, diarrhea, nausea and vomiting.  Genitourinary:  Negative for dysuria.  Musculoskeletal:  Negative for arthralgias, back pain and myalgias.  Skin:  Negative for color change, rash and wound.  Neurological:  Negative for dizziness, weakness and headaches.  Psychiatric/Behavioral:  Negative for behavioral problems. The patient is not nervous/anxious.     Health Maintenance  Topic Date Due   Pneumococcal Vaccine 58-19 Years old (1 of 2 - PCV) Never done   OPHTHALMOLOGY EXAM  Never done   Hepatitis C Screening  Never done   DTaP/Tdap/Td (1 - Tdap) Never done   Cervical Cancer Screening (HPV/Pap Cotest)  Never done   MAMMOGRAM  Never done   Zoster Vaccines- Shingrix (1 of 2) Never done   COVID-19 Vaccine (3 - 2024-25 season) 12/10/2022   Diabetic kidney evaluation - Urine ACR  11/09/2023   HEMOGLOBIN A1C  11/18/2023   FOOT EXAM  02/15/2024   Diabetic kidney evaluation - eGFR measurement  05/20/2024   Colonoscopy  03/08/2033   INFLUENZA VACCINE  Completed   HIV Screening  Completed   HPV VACCINES  Aged Out    Physical Exam: Vitals:   05/24/23 1011  BP: 118/74  Pulse: 60  Resp: 20  Temp: (!) 97.1 F (36.2 C)  SpO2: 98%  Weight: 207 lb (93.9 kg)  Height: 5\' 2"  (1.575 m)   Body mass index is 37.86 kg/m. Physical Exam Constitutional:      Appearance: She is obese.  HENT:     Head: Normocephalic and atraumatic.     Nose: Nose normal.     Mouth/Throat:     Mouth: Mucous membranes are moist.  Eyes:     Conjunctiva/sclera: Conjunctivae normal.  Cardiovascular:     Rate and Rhythm: Normal rate and regular rhythm.  Pulmonary:     Effort: Pulmonary effort is normal.     Breath sounds: Normal breath sounds.  Abdominal:     General: Bowel sounds are normal.     Palpations: Abdomen is soft.  Musculoskeletal:        General: Normal range of motion.     Cervical back: Normal range of motion.  Skin:    General:  Skin is warm and dry.  Neurological:     General: No focal deficit present.     Mental Status: She is alert and oriented to person, place, and time.  Psychiatric:        Mood and Affect: Mood normal.        Behavior: Behavior normal.        Thought Content: Thought content normal.        Judgment: Judgment normal.     Labs reviewed: Basic Metabolic Panel: Recent Labs    09/13/22 0356 09/14/22 0450 12/20/22 1639 01/03/23 1235 05/21/23 1046  NA 131*   < > 136 135 131*  K 3.8   < > 4.1 4.0 4.9  CL 102   < >  103 104 93*  CO2 25   < > 24 24 30   GLUCOSE 382*   < > 126* 287* 604*  BUN 42*   < > 32* 31* 29*  CREATININE 1.69*   < > 1.42* 1.52* 1.79*  CALCIUM 8.4*   < > 8.7* 8.7* 9.6  MG 2.2  --   --   --   --   PHOS 3.7  --   --   --   --    < > = values in this interval not displayed.   Liver Function Tests: Recent Labs    05/21/23 1046  AST 21  ALT 28  BILITOT 0.6  PROT 8.0   No results for input(s): "LIPASE", "AMYLASE" in the last 8760 hours. No results for input(s): "AMMONIA" in the last 8760 hours. CBC: Recent Labs    09/12/22 0614 09/14/22 0450 05/21/23 1046  WBC 12.3* 9.8 8.0  NEUTROABS  --   --  4,912  HGB 11.8* 11.8* 12.9  HCT 33.9* 35.0* 41.5  MCV 85.8 86.8 90.0  PLT 216 208 210   Lipid Panel: No results for input(s): "CHOL", "HDL", "LDLCALC", "TRIG", "CHOLHDL", "LDLDIRECT" in the last 8760 hours. Lab Results  Component Value Date   HGBA1C >14.0 (H) 05/21/2023    Procedures since last visit: No results found.  Assessment/Plan     Labs/tests ordered:    Next appt:  Visit date not found

## 2023-05-25 ENCOUNTER — Telehealth: Payer: Self-pay

## 2023-05-25 LAB — LIPID PANEL
Cholesterol: 147 mg/dL (ref <200)
HDL: 34 mg/dL — ABNORMAL LOW (ref >=50)
LDL Cholesterol (Calc): 86 mg/dL
Non-HDL Cholesterol (Calc): 113 mg/dL (ref <130)
Total CHOL/HDL Ratio: 4.3 (calc) (ref <5.0)
Triglycerides: 174 mg/dL — ABNORMAL HIGH (ref <150)

## 2023-05-25 LAB — BASIC METABOLIC PANEL WITH GFR
BUN/Creatinine Ratio: 19 (calc) (ref 6–22)
BUN: 35 mg/dL — ABNORMAL HIGH (ref 7–25)
CO2: 21 mmol/L (ref 20–32)
Calcium: 9.2 mg/dL (ref 8.6–10.4)
Chloride: 94 mmol/L — ABNORMAL LOW (ref 98–110)
Creat: 1.86 mg/dL — ABNORMAL HIGH (ref 0.50–1.03)
Glucose, Bld: 622 mg/dL (ref 65–99)
Potassium: 5.1 mmol/L (ref 3.5–5.3)
Sodium: 129 mmol/L — ABNORMAL LOW (ref 135–146)
eGFR: 31 mL/min/{1.73_m2} — ABNORMAL LOW (ref >=60)

## 2023-05-25 MED ORDER — NOVOLIN 70/30 FLEXPEN (70-30) 100 UNIT/ML ~~LOC~~ SUPN: 28.0000 [IU] | PEN_INJECTOR | Freq: Two times a day (BID) | SUBCUTANEOUS | 3 refills | Status: DC

## 2023-05-25 NOTE — Telephone Encounter (Signed)
[facility] from [facility] is calling for a Critical lab for the patient. Glucose verified by repeat analysis and was in a fasting window and the reading was 622

## 2023-05-25 NOTE — Progress Notes (Signed)
-    glucose 622, pls increase insulin isophane and regular human (Novolin 70/30 to 28 units BID, pls check blood sugar daily log and follow up in 2 weeks. Pls bring your blood sugar log -  Sodium 129, low, will need to re-check BMP next visit -   triglycerides 174, elevated, continue Atorvastatin and low fat diet, exercise at least 150 minutes/week

## 2023-05-28 ENCOUNTER — Telehealth: Payer: Self-pay

## 2023-05-28 NOTE — Telephone Encounter (Signed)
Clinic received a fax for Medical Records request from [employer]. Fax forwarded to Medical Records Department.

## 2023-06-07 ENCOUNTER — Encounter: Payer: 59 | Admitting: Orthopedic Surgery

## 2023-06-08 NOTE — Progress Notes (Signed)
 This encounter was created in error - please disregard.

## 2023-06-20 ENCOUNTER — Telehealth: Payer: Self-pay | Admitting: Family

## 2023-06-20 NOTE — Telephone Encounter (Signed)
 Pt confirmed appt on 06/21/23

## 2023-06-20 NOTE — Progress Notes (Unsigned)
 [facility] Note      PCP: Hazle Nordmann, NP (last seen 11/24) Primary Cardiologist: Debbe Odea, MD (last seen 01/23)  HPI:  Sharon Delgado is a 58 y/o female with a history of HTN, DM, hyperlipidemia, CAD (LHC done 09/22), tobacco use and heart failure.   Admitted 09/11/22 due to hyperglycemia with glucose of 1028. Placed on insulin drip & home insulin regimen changed.   Echo 12/07/20: EF of 35-40% along with mild MR. Echo 05/03/21: EF of 60-65%  Echo 11/30/22: EF 55-60% with Grade I DD, normal PA Pressure of 18.7 mmHg  LHC 12/10/20:  Prox RCA to Mid RCA lesion is 40% stenosed.   Mid RCA to Dist RCA lesion is 30% stenosed.   Prox Cx to Mid Cx lesion is 40% stenosed.  1.  Mild to moderate nonobstructive coronary artery disease 2.  Left ventricular angiography was not performed.  EF was moderately reduced by echo. 3.  High normal left ventricular end-diastolic pressure of 12 mmHg.   She presents today for a HF follow-up visit with a chief complaint of minimal shortness of breath with moderate exertion. Chronic in nature. Has associated fatigue, dry cough and slight weight gain along with this. Has some buttock pain/ soreness as fell down inside steps at home twice in the last week. Had slippery socks on both times and thinks she just missed the step. No hitting the head or LOC. Denies chest pain, palpitations, abdominal distention, pedal edema, dizziness or difficultly sleeping. Reports sleeping well on 1 pillow.          She says that she hasn't heard from the sleep lab and isn't sure she has their number to call them.                        ROS: All systems negative except as listed in HPI, PMH and Problem List.  SH:  Social History   Socioeconomic History   Marital status: Single    Spouse name: Not on file   Number of children: Not on file   Years of education: Not on file   Highest education level: 12th grade  Occupational History   Not on file  Tobacco Use    Smoking status: Former    Current packs/day: 0.00    Types: Cigarettes, Cigars    Quit date: 12/06/2020    Years since quitting: 2.5   Smokeless tobacco: Never  Vaping Use   Vaping status: Never Used  Substance and Sexual Activity   Alcohol use: Not Currently    Alcohol/week: 15.0 standard drinks of alcohol    Types: 15 Cans of beer per week   Drug use: No   Sexual activity: Not on file  Other Topics Concern   Not on file  Social History Narrative   Not on file   Social Drivers of Health   Financial Resource Strain: Low Risk  (05/22/2023)   Overall Financial Resource Strain (CARDIA)    Difficulty of Paying Living Expenses: Not hard at all  Food Insecurity: Food Insecurity Present (05/22/2023)   Hunger Vital Sign    Worried About Running Out of Food in the Last Year: Sometimes true    Ran Out of Food in the Last Year: Never true  Transportation Needs: No Transportation Needs (05/22/2023)   PRAPARE - Administrator, Civil Service (Medical): No    Lack of Transportation (Non-Medical): No  Physical Activity: Unknown (05/22/2023)   Exercise  Vital Sign    Days of Exercise per Week: 0 days    Minutes of Exercise per Session: Not on file  Stress: Stress Concern Present (05/22/2023)   [employer] of Occupational Health - Occupational Stress Questionnaire    Feeling of Stress : To some extent  Social Connections: Moderately Isolated (05/22/2023)   Social Connection and Isolation Panel [NHANES]    Frequency of Communication with Friends and Family: More than three times a week    Frequency of Social Gatherings with Friends and Family: Once a week    Attends Religious Services: 1 to 4 times per year    Active Member of [employer] or Organizations: No    Attends Engineer, structural: Not on file    Marital Status: Separated  Intimate Partner Violence: Not At Risk (09/11/2022)   Humiliation, Afraid, Rape, and Kick questionnaire    Fear of Current or Ex-Partner: No     Emotionally Abused: No    Physically Abused: No    Sexually Abused: No    FH:  Family History  Problem Relation Age of Onset   Diabetes Mother    Hypertension Mother    COPD Father    Alcoholism Father     Past Medical History:  Diagnosis Date   CHF (congestive heart failure) (HCC)    Hypertension     Current Outpatient Medications  Medication Sig Dispense Refill   acetaminophen (TYLENOL) 500 MG tablet Take 500-1,000 mg by mouth 2 (two) times daily as needed for mild pain, moderate pain or headache.     albuterol (VENTOLIN HFA) 108 (90 Base) MCG/ACT inhaler Inhale 2 puffs into the lungs every 6 (six) hours as needed for wheezing or shortness of breath. 8 g 0   aspirin EC 81 MG tablet Take 81 mg by mouth daily.     atorvastatin (LIPITOR) 40 MG tablet TAKE 1 TABLET BY MOUTH EVERY DAY 90 tablet 3   Blood Glucose Monitoring Suppl DEVI 1 each by Does not apply route 3 (three) times daily. May dispense any manufacturer covered by patient's insurance. 1 each 0   carvedilol (COREG) 25 MG tablet Take 0.5 tablets (12.5 mg total) by mouth 2 (two) times daily with a meal. 45 tablet 3   Continuous Glucose Receiver (FREESTYLE LIBRE 3 READER) DEVI 1 Device by Does not apply route daily. 1 each 11   Continuous Glucose Sensor (FREESTYLE LIBRE 3 SENSOR) MISC 1 Device by Does not apply route every 14 (fourteen) days. Place 1 sensor on the skin every 14 days. Use to check glucose continuously 2 each 11   empagliflozin (JARDIANCE) 10 MG TABS tablet Take 1 tablet (10 mg total) by mouth daily before breakfast. 30 tablet 5   furosemide (LASIX) 40 MG tablet Take 1 tablet (40 mg total) by mouth daily as needed. For swelling. (Patient not taking: Reported on 05/24/2023) 30 tablet 5   Glucose Blood (BLOOD GLUCOSE TEST STRIPS) STRP 1 each by Does not apply route 3 (three) times daily. Use as directed to check blood sugar. May dispense any manufacturer covered by patient's insurance and fits patient's device. 100  strip 0   insulin isophane & regular human KwikPen (NOVOLIN 70/30 KWIKPEN) (70-30) 100 UNIT/ML KwikPen Inject 28 Units into the skin 2 (two) times daily with a meal. 15 mL 3   Insulin Pen Needle (PEN NEEDLES) 31G X 8 MM MISC 1 each by Does not apply route 3 (three) times daily. May dispense any manufacturer covered by patient's  insurance. 100 each 3   isosorbide mononitrate (IMDUR) 30 MG 24 hr tablet TAKE 1 TABLET (30 MG TOTAL) BY MOUTH DAILY. PLEASE KEEP APPOINTMENT ON 12/05/2021 FOR FUTURE REFILLS. 90 tablet 3   Lancet Device MISC 1 each by Does not apply route 3 (three) times daily. May dispense any manufacturer covered by patient's insurance. (Patient not taking: Reported on 03/07/2023) 1 each 0   Lancets MISC 1 each by Does not apply route 3 (three) times daily. Use as directed to check blood sugar. May dispense any manufacturer covered by patient's insurance and fits patient's device. (Patient not taking: Reported on 05/24/2023) 100 each 0   sacubitril-valsartan (ENTRESTO) 49-51 MG Take 1 tablet by mouth 2 (two) times daily. 180 tablet 3   spironolactone (ALDACTONE) 25 MG tablet Take 1 tablet (25 mg total) by mouth daily. 90 tablet 3   No current facility-administered medications for this visit.   There were no vitals filed for this visit.  Wt Readings from Last 3 Encounters:  05/24/23 207 lb (93.9 kg)  03/07/23 217 lb (98.4 kg)  02/15/23 218 lb 3.2 oz (99 kg)   Lab Results  Component Value Date   CREATININE 1.86 (H) 05/24/2023   CREATININE 1.79 (H) 05/21/2023   CREATININE 1.52 (H) 01/03/2023   PHYSICAL EXAM:  General:  Well appearing. No resp difficulty HEENT: normal Neck: supple. JVP flat. No lymphadenopathy or thryomegaly appreciated. Cor: PMI normal. Regular rhythm, rate. No rubs, gallops or murmurs. Lungs: clear Abdomen: soft, nontender, nondistended. No hepatosplenomegaly. No bruits or masses.  Extremities: no cyanosis, clubbing, rash, edema Neuro: alert & oriented x3,  cranial nerves grossly intact. Moves all 4 extremities w/o difficulty. Affect pleasant.   ECG: not done   ASSESSMENT & PLAN:  1: NICM with preserved ejection fraction- - HF likely due to HTN as cath showed nonobstructive CAD - NYHA class II - euvolemic today - weighing daily; reminded to call for an overnight weight gain of > 2 pounds or a weekly weight gain of > 5 pounds - weight up 4 pounds since last here 2.5 months ago - Echo 12/07/20: EF of 35-40% along with mild MR.  - Echo 05/03/21: EF of 60-65% - Echo 11/30/22: EF 55-60% with Grade I DD, normal PA Pressure of 18.7 mmHg - not adding "much" salt and feels like she's doing better with her salt usage; emphasized the importance of not adding any salt to her food - continue carvedilol 12.5mg  BID  - continue jardiance 10mg  daily - continue furosemide 40mg  PRN but says that she hasn't had to take this in "awhile" - continue isosorbide [place] 30mg  daily - continue entresto 49/51mg  BID - continue spironolactone 25mg  daily - BNP 12/07/20 was 952.8  2: HTN-  - BP 124/67 - saw PCP Coletta Memos) 11/24 - BMP 01/03/23 reviewed and showed sodium 135, potassium 4.0, creatinine 1.52 and GFR 40  3: DM- - A1c 11/09/22 was 10.7% - continue jardiance 10mg  daily   4: CAD- - saw cardiology (Ag[facility]-Etang) 01/23; f/u needs to be scheduled - continue atorvastatin 40mg  daily - LDL 04/05/22 was 112; recheck this at next visit - LHC 12/10/20:  Prox RCA to Mid RCA lesion is 40% stenosed.   Mid RCA to Dist RCA lesion is 30% stenosed.   Prox Cx to Mid Cx lesion is 40% stenosed.  1.  Mild to moderate nonobstructive coronary artery disease 2.  Left ventricular angiography was not performed.  EF was moderately reduced by echo. 3.  High normal left  ventricular end-diastolic pressure of 12 mmHg.  5: Snoring- - + snoring and apneic episodes - sleep study has not been scheduled yet - sleep lab contact number provided to patient on her AVS   Return in 6 months,  sooner if needed.     Delma Freeze, FNP 06/20/23

## 2023-06-21 ENCOUNTER — Encounter: Payer: Self-pay | Admitting: Family

## 2023-06-21 ENCOUNTER — Ambulatory Visit: Payer: Self-pay | Admitting: Family

## 2023-06-21 ENCOUNTER — Other Ambulatory Visit
Admission: RE | Admit: 2023-06-21 | Discharge: 2023-06-21 | Disposition: A | Discharge status: Home / Self Care | Source: Ambulatory Visit | Attending: Family | Admitting: Family

## 2023-06-21 VITALS — BP 171/75 | HR 56 | Wt 210.0 lb

## 2023-06-21 DIAGNOSIS — E1122 Type 2 diabetes mellitus with diabetic chronic kidney disease: Secondary | ICD-10-CM

## 2023-06-21 DIAGNOSIS — I5032 Chronic diastolic (congestive) heart failure: Secondary | ICD-10-CM

## 2023-06-21 DIAGNOSIS — I10 Essential (primary) hypertension: Secondary | ICD-10-CM

## 2023-06-21 DIAGNOSIS — R0683 Snoring: Secondary | ICD-10-CM

## 2023-06-21 DIAGNOSIS — I2510 Atherosclerotic heart disease of native coronary artery without angina pectoris: Secondary | ICD-10-CM | POA: Diagnosis not present

## 2023-06-21 DIAGNOSIS — N1832 Chronic kidney disease, stage 3b: Secondary | ICD-10-CM

## 2023-06-21 LAB — BASIC METABOLIC PANEL
Anion gap: 11 (ref 5–15)
BUN: 26 mg/dL — ABNORMAL HIGH (ref 6–20)
CO2: 27 mmol/L (ref 22–32)
Calcium: 9.2 mg/dL (ref 8.9–10.3)
Chloride: 98 mmol/L (ref 98–111)
Creatinine, Ser: 1.38 mg/dL — ABNORMAL HIGH (ref 0.44–1.00)
GFR, Estimated: 44 mL/min — ABNORMAL LOW (ref >60)
Glucose, Bld: 332 mg/dL — ABNORMAL HIGH (ref 70–99)
Potassium: 4.1 mmol/L (ref 3.5–5.1)
Sodium: 136 mmol/L (ref 135–145)

## 2023-06-21 MED ORDER — EMPAGLIFLOZIN 10 MG PO TABS: 10.0000 mg | ORAL_TABLET | Freq: Every day | ORAL | Status: DC

## 2023-06-21 NOTE — Patient Instructions (Addendum)
 Great to see you today!!!  Medication Changes:  None, continue current medications  Lab Work:  Labs are needed today: Go over to the [facility]. Go pass the gift shop and have your blood work completed.  Labs done today, your results will be available in MyChart, we will contact you for abnormal readings. We will only call you if the results are abnormal or if the provider would like to make medication changes.  Procedures:  Please call the sleep center at [phone]   Special Instructions // Education: Do the following things EVERYDAY: Weigh yourself in the morning before breakfast. Write it down and keep it in a log. Take your medicines as prescribed Eat low salt foods--Limit salt (sodium) to 2000 mg per day.  Stay as active as you can everyday Limit all fluids for the day to less than 2 liters   Follow-Up in: 3 months    If you have any questions or concerns before your next appointment please send [place] a message through [place] or call our office at [phone], If it is after office hours your call will be answered by our answering service and directed appropriately.     At the [facility], you and your health needs are our priority. We have a designated team specialized in the treatment of Heart Failure. This Care Team includes your primary Heart Failure Specialized Cardiologist (physician), Advanced Practice Providers (APPs- Physician Assistants and Nurse Practitioners), and Pharmacist who all work together to provide you with the care you need, when you need it.   You may see any of the following providers on your designated Care Team at your next follow up:  Dr. Arvilla Meres Dr. Marca Ancona Dr. Dorthula Nettles Dr. Theresia Bough Tonye Becket, NP Robbie Lis, [place] [address] [place], [place] Brynda Peon, NP [place] Lee, NP Clarisa Kindred, NP Enos Fling, PharmD

## 2023-06-29 ENCOUNTER — Other Ambulatory Visit: Payer: Self-pay | Admitting: Adult Health

## 2023-06-29 DIAGNOSIS — E1165 Type 2 diabetes mellitus with hyperglycemia: Secondary | ICD-10-CM

## 2023-07-23 ENCOUNTER — Other Ambulatory Visit: Payer: Self-pay | Admitting: Family

## 2023-09-05 ENCOUNTER — Encounter: Payer: 59 | Admitting: Family

## 2023-09-18 ENCOUNTER — Telehealth: Payer: Self-pay | Admitting: Family

## 2023-09-18 NOTE — Telephone Encounter (Signed)
 Called to confirm/remind patient of their appointment at the [facility] on 09/19/23.   Appointment:   [x] Confirmed  [] Left mess   [] No answer/No voice mail  [] VM Full/unable to leave message  [] Phone not in service  Patient reminded to bring all medications and/or complete list.  Confirmed patient has transportation. Gave directions, instructed to utilize valet parking.

## 2023-09-18 NOTE — Progress Notes (Unsigned)
 Advanced [facility] Failure Clinic Note      PCP: Ulyses Gandy, NP (last seen 02/25) Primary Cardiologist: Constancia Delton, MD (last seen 01/23)  Chief Complaint: shortness of breath   HPI:  Sharon Delgado is a 58 y/o female with a history of HTN, DM, hyperlipidemia, CAD (LHC done 09/22), tobacco use and heart failure.   Admitted 09/11/22 due to hyperglycemia with glucose of 1028. Placed on insulin  drip & home insulin  regimen changed.   Echo 12/07/20: EF of 35-40% along with mild MR.  LHC 12/10/20:  Prox RCA to Mid RCA lesion is 40% stenosed.   Mid RCA to Dist RCA lesion is 30% stenosed.   Prox Cx to Mid Cx lesion is 40% stenosed.  1.  Mild to moderate nonobstructive coronary artery disease 2.  Left ventricular angiography was not performed.  EF was moderately reduced by echo. 3.  High normal left ventricular end-diastolic pressure of 12 mmHg.  Echo 05/03/21: EF of 60-65%  Echo 11/30/22: EF 55-60% with Grade I DD, normal PA Pressure of 18.7 mmHg  She presents today, with her daughter, for a HF follow-up visit with a chief complaint of shortness of breath. Has associated fatigue, wheezing & chronic difficulty sleeping.along with this. Denies chest pain, cough, palpitations, abdominal distention, pedal edema or dizziness. Says that her glucose is still running high and notes that her A1c has risen.  Has not heard from the sleep lab about getting her sleep study scheduled. Daughter notes that patient snores and has apneic episodes.   Says that she gets short of breath with walking too far (such as walking into the office). She feels like it resolves fairly quickly once she sits for a few minutes.                ROS: All systems negative except as listed in HPI, PMH and Problem List.  SH:  Social History   Socioeconomic History   Marital status: Single    Spouse name: Not on file   Number of children: Not on file   Years of education: Not on file   Highest education level: 12th grade   Occupational History   Not on file  Tobacco Use   Smoking status: Former    Current packs/day: 0.00    Types: Cigarettes, Cigars    Quit date: 12/06/2020    Years since quitting: 2.7   Smokeless tobacco: Never  Vaping Use   Vaping status: Never Used  Substance and Sexual Activity   Alcohol use: Not Currently    Alcohol/week: 15.0 standard drinks of alcohol    Types: 15 Cans of beer per week   Drug use: No   Sexual activity: Not on file  Other Topics Concern   Not on file  Social History Narrative   Not on file   Social Drivers of Health   Financial Resource Strain: Low Risk  (05/22/2023)   Overall Financial Resource Strain (CARDIA)    Difficulty of Paying Living Expenses: Not hard at all  Food Insecurity: Food Insecurity Present (05/22/2023)   Hunger Vital Sign    Worried About Running Out of Food in the Last Year: Sometimes true    Ran Out of Food in the Last Year: Never true  Transportation Needs: No Transportation Needs (05/22/2023)   PRAPARE - Administrator, Civil Service (Medical): No    Lack of Transportation (Non-Medical): No  Physical Activity: Unknown (05/22/2023)   Exercise Vital Sign    Days of Exercise  per Week: 0 days    Minutes of Exercise per Session: Not on file  Stress: Stress Concern Present (05/22/2023)   [employer] of Occupational Health - Occupational Stress Questionnaire    Feeling of Stress : To some extent  Social Connections: Moderately Isolated (05/22/2023)   Social Connection and Isolation Panel [NHANES]    Frequency of Communication with Friends and Family: More than three times a week    Frequency of Social Gatherings with Friends and Family: Once a week    Attends Religious Services: 1 to 4 times per year    Active Member of [employer] or Organizations: No    Attends Engineer, structural: Not on file    Marital Status: Separated  Intimate Partner Violence: Not At Risk (09/11/2022)   Humiliation, Afraid, Rape, and Kick  questionnaire    Fear of Current or Ex-Partner: No    Emotionally Abused: No    Physically Abused: No    Sexually Abused: No    FH:  Family History  Problem Relation Age of Onset   Diabetes Mother    Hypertension Mother    COPD Father    Alcoholism Father     Past Medical History:  Diagnosis Date   CHF (congestive heart failure) (HCC)    Hypertension     Current Outpatient Medications  Medication Sig Dispense Refill   acetaminophen  (TYLENOL ) 500 MG tablet Take 500-1,000 mg by mouth 2 (two) times daily as needed for mild pain, moderate pain or headache.     albuterol  (VENTOLIN  HFA) 108 (90 Base) MCG/ACT inhaler Inhale 2 puffs into the lungs every 6 (six) hours as needed for wheezing or shortness of breath. 8 g 0   aspirin  EC 81 MG tablet Take 81 mg by mouth daily.     atorvastatin  (LIPITOR) 40 MG tablet TAKE 1 TABLET BY MOUTH EVERY DAY 90 tablet 3   Blood Glucose Monitoring Suppl DEVI 1 each by Does not apply route 3 (three) times daily. May dispense any manufacturer covered by patient's insurance. 1 each 0   carvedilol  (COREG ) 25 MG tablet TAKE 0.5 TABLETS (12.5 MG TOTAL) BY MOUTH 2 (TWO) TIMES DAILY WITH A MEAL. 90 tablet 1   Continuous Glucose Receiver (FREESTYLE LIBRE 3 READER) DEVI 1 Device by Does not apply route daily. 1 each 11   Continuous Glucose Sensor (FREESTYLE LIBRE 3 SENSOR) MISC 1 Device by Does not apply route every 14 (fourteen) days. Place 1 sensor on the skin every 14 days. Use to check glucose continuously 2 each 11   empagliflozin  (JARDIANCE ) 10 MG TABS tablet Take 1 tablet (10 mg total) by mouth daily before breakfast.     furosemide  (LASIX ) 40 MG tablet Take 1 tablet (40 mg total) by mouth daily as needed. For swelling. 30 tablet 5   Glucose Blood (BLOOD GLUCOSE TEST STRIPS) STRP 1 each by Does not apply route 3 (three) times daily. Use as directed to check blood sugar. May dispense any manufacturer covered by patient's insurance and fits patient's device. 100  strip 0   insulin  isophane & regular human KwikPen (NOVOLIN  70/30 KWIKPEN) (70-30) 100 UNIT/ML KwikPen Inject 28 Units into the skin 2 (two) times daily with a meal. 15 mL 3   Insulin  Pen Needle (PEN NEEDLES) 31G X 8 MM MISC 1 each by Does not apply route 3 (three) times daily. May dispense any manufacturer covered by patient's insurance. 100 each 3   isosorbide  mononitrate (IMDUR ) 30 MG 24 hr tablet TAKE  1 TABLET (30 MG TOTAL) BY MOUTH DAILY. PLEASE KEEP APPOINTMENT ON 12/05/2021 FOR FUTURE REFILLS. 90 tablet 3   Lancet Device MISC 1 each by Does not apply route 3 (three) times daily. May dispense any manufacturer covered by patient's insurance. 1 each 0   Lancets MISC 1 each by Does not apply route 3 (three) times daily. Use as directed to check blood sugar. May dispense any manufacturer covered by patient's insurance and fits patient's device. 100 each 0   sacubitril -valsartan  (ENTRESTO ) 49-51 MG Take 1 tablet by mouth 2 (two) times daily. 180 tablet 3   spironolactone  (ALDACTONE ) 25 MG tablet Take 1 tablet (25 mg total) by mouth daily. 90 tablet 3   No current facility-administered medications for this visit.   There were no vitals filed for this visit.  Wt Readings from Last 3 Encounters:  06/21/23 210 lb (95.3 kg)  05/24/23 207 lb (93.9 kg)  03/07/23 217 lb (98.4 kg)   Lab Results  Component Value Date   CREATININE 1.38 (H) 06/21/2023   CREATININE 1.86 (H) 05/24/2023   CREATININE 1.79 (H) 05/21/2023    PHYSICAL EXAM:  General: Well appearing. No resp difficulty HEENT: normal Neck: supple, no JVD Cor: Regular rhythm, bradycardic. No rubs, gallops or murmurs Lungs: clear Abdomen: soft, nontender, nondistended. Extremities: no cyanosis, clubbing, rash, edema Neuro: alert & oriented X 3. Moves all 4 extremities w/o difficulty. Affect pleasant  ECG: not done   ASSESSMENT & PLAN:  1: NICM with preserved ejection fraction- - HF likely due to HTN as cath showed nonobstructive  CAD - NYHA class II - euvolemic today - weighing daily; reminded to call for an overnight weight gain of > 2 pounds or a weekly weight gain of > 5 pounds - weight down 7 pounds since last here 4 months ago - Echo 12/07/20: EF of 35-40% along with mild MR.  - Echo 05/03/21: EF of 60-65% - Echo 11/30/22: EF 55-60% with Grade I DD, normal PA Pressure of 18.7 mmHg - not adding "much" salt and feels like she's doing better with her salt usage; emphasized the importance of not adding any salt to her food - continue carvedilol  12.5mg  BID; consider titrating down since she has HFpEF - continue jardiance  10mg  daily - continue furosemide  40mg  PRN  - continue isosorbide  MN 30mg  daily - continue entresto  49/51mg  BID - continue spironolactone  25mg  daily - BNP 12/07/20 was 952.8  2: HTN-  - BP 171/75 (has not taken medications yet today but will upon her return home) - saw PCP Sharon Delgado) 02/25 - BMP 05/24/23 reviewed and showed sodium 129, potassium 5.1, creatinine 1.86 and GFR 31 - BMET today  3: DM- - A1c 05/21/23 was >14% - glucose 05/24/23 was 622 - continue jardiance  10mg  daily  - humalog 28 units BID w/ meals - discussed that she may benefit from endocrinology referral; daughter will discuss with her PCP  4: CAD- - saw cardiology (Sharon Delgado) 01/23; f/u needs to be scheduled - continue atorvastatin  40mg  daily - LDL 05/24/23 was 86 - LHC 12/10/20:  Prox RCA to Mid RCA lesion is 40% stenosed.   Mid RCA to Dist RCA lesion is 30% stenosed.   Prox Cx to Mid Cx lesion is 40% stenosed.  1.  Mild to moderate nonobstructive coronary artery disease 2.  Left ventricular angiography was not performed.  EF was moderately reduced by echo. 3.  High normal left ventricular end-diastolic pressure of 12 mmHg.  5: Snoring- - + snoring and apneic  episodes - sleep study has not been scheduled yet - sleep center number provided on her AVS   Return in 3 months, sooner if needed.   Sharon Console,  FNP 09/18/23

## 2023-09-19 ENCOUNTER — Other Ambulatory Visit (HOSPITAL_COMMUNITY): Payer: Self-pay

## 2023-09-19 ENCOUNTER — Other Ambulatory Visit
Admission: RE | Admit: 2023-09-19 | Discharge: 2023-09-19 | Disposition: A | Discharge status: Home / Self Care | Source: Ambulatory Visit | Attending: Family | Admitting: Family

## 2023-09-19 ENCOUNTER — Other Ambulatory Visit: Payer: Self-pay

## 2023-09-19 ENCOUNTER — Telehealth: Payer: Self-pay | Admitting: Pharmacist

## 2023-09-19 ENCOUNTER — Telehealth: Payer: Self-pay

## 2023-09-19 ENCOUNTER — Ambulatory Visit (HOSPITAL_BASED_OUTPATIENT_CLINIC_OR_DEPARTMENT_OTHER): Admitting: Family

## 2023-09-19 ENCOUNTER — Encounter: Payer: Self-pay | Admitting: Family

## 2023-09-19 ENCOUNTER — Ambulatory Visit: Payer: Self-pay | Admitting: Family

## 2023-09-19 VITALS — BP 107/61 | HR 62 | Wt 212.0 lb

## 2023-09-19 DIAGNOSIS — E1122 Type 2 diabetes mellitus with diabetic chronic kidney disease: Secondary | ICD-10-CM | POA: Diagnosis not present

## 2023-09-19 DIAGNOSIS — I10 Essential (primary) hypertension: Secondary | ICD-10-CM | POA: Diagnosis not present

## 2023-09-19 DIAGNOSIS — I5032 Chronic diastolic (congestive) heart failure: Secondary | ICD-10-CM | POA: Diagnosis not present

## 2023-09-19 DIAGNOSIS — N1832 Chronic kidney disease, stage 3b: Secondary | ICD-10-CM

## 2023-09-19 DIAGNOSIS — I2510 Atherosclerotic heart disease of native coronary artery without angina pectoris: Secondary | ICD-10-CM | POA: Diagnosis not present

## 2023-09-19 DIAGNOSIS — R0683 Snoring: Secondary | ICD-10-CM

## 2023-09-19 LAB — BASIC METABOLIC PANEL WITH GFR
Anion gap: 9 (ref 5–15)
BUN: 25 mg/dL — ABNORMAL HIGH (ref 6–20)
CO2: 25 mmol/L (ref 22–32)
Calcium: 8.8 mg/dL — ABNORMAL LOW (ref 8.9–10.3)
Chloride: 103 mmol/L (ref 98–111)
Creatinine, Ser: 1.67 mg/dL — ABNORMAL HIGH (ref 0.44–1.00)
GFR, Estimated: 35 mL/min — ABNORMAL LOW (ref >60)
Glucose, Bld: 270 mg/dL — ABNORMAL HIGH (ref 70–99)
Potassium: 4.3 mmol/L (ref 3.5–5.1)
Sodium: 137 mmol/L (ref 135–145)

## 2023-09-19 MED ORDER — ENTRESTO 24-26 MG PO TABS
1.0000 | ORAL_TABLET | Freq: Two times a day (BID) | ORAL | 11 refills | Status: DC
  Filled 2023-09-19 – 2023-11-07 (×3): qty 60, 30d supply, fill #0
  Filled 2023-12-06 – 2023-12-17 (×2): qty 60, 30d supply, fill #1
  Filled 2024-01-15 – 2024-01-16 (×2): qty 60, 30d supply, fill #2

## 2023-09-19 MED ORDER — EMPAGLIFLOZIN 10 MG PO TABS: 10.0000 mg | ORAL_TABLET | Freq: Every day | ORAL | 5 refills | Status: DC

## 2023-09-19 MED ORDER — EMPAGLIFLOZIN 10 MG PO TABS: 10.0000 mg | ORAL_TABLET | Freq: Every day | ORAL | 11 refills | Status: DC

## 2023-09-19 NOTE — Patient Instructions (Signed)
 Lab Work:  Go over to the [facility]. Go pass the gift shop and have your blood work completed.  We will only call you if the results are abnormal or if the provider would like to make medication changes.   Referrals:  We have placed a referral today to General Cardiology. They should reach out to you within 1-2 weeks. If they do not, please reach out to them. Their information is below on this AVS. They are located downstairs on the Lower Level.   Special Instructions // Education:  Go across to our community pharmacy to get your medications.  Follow-Up in: 3 months with Shawnee Dellen, FNP.  At the [facility], you and your health needs are our priority. We have a designated team specialized in the treatment of Heart Failure. This Care Team includes your primary Heart Failure Specialized Cardiologist (physician), Advanced Practice Providers (APPs- Physician Assistants and Nurse Practitioners), and Pharmacist who all work together to provide you with the care you need, when you need it.   You may see any of the following providers on your designated Care Team at your next follow up:  Dr. Jules Oar Dr. Peder Bourdon Dr. Alwin Baars Dr. Judyth Nunnery Shawnee Dellen, FNP Bevely Brush, RPH-CPP  Please be sure to bring in all your medications bottles to every appointment.   Need to Contact Us :  If you have any questions or concerns before your next appointment please send us  a message through [place] or call our office at [phone].    TO LEAVE A MESSAGE FOR THE NURSE SELECT OPTION 2, PLEASE LEAVE A MESSAGE INCLUDING: YOUR NAME DATE OF BIRTH CALL BACK NUMBER REASON FOR CALL**this is important as we prioritize the call backs  YOU WILL RECEIVE A CALL BACK THE SAME DAY AS LONG AS YOU CALL BEFORE 4:00 PM

## 2023-09-19 NOTE — Telephone Encounter (Signed)
 Activated copay cards for Entresto  and Jardiance :  Entresto  BIN [id number] PCN [id number] GRP [id number] ID [id number]  Jardiance  BIN: [id number] PCN: [employer] GRP: [id number] ID: [id number]

## 2023-09-19 NOTE — Telephone Encounter (Signed)
 Advanced Heart Failure Patient Advocate Encounter  Prior authorization for Jardiance  has been submitted and approved. Test billing returns $93.57 for 30 day supply. This plan limits 30 day fill, and this patient is eligible to use copay savings cards that will reduce copay.  Key: [id number] Effective: 09/19/2023 to 09/18/2024  Kennis Peacock, CPhT Rx Patient Advocate Phone: [phone]

## 2023-09-21 ENCOUNTER — Other Ambulatory Visit: Payer: Self-pay

## 2023-10-04 ENCOUNTER — Other Ambulatory Visit: Payer: Self-pay

## 2023-10-04 DIAGNOSIS — E1165 Type 2 diabetes mellitus with hyperglycemia: Secondary | ICD-10-CM

## 2023-10-04 MED ORDER — NOVOLIN 70/30 FLEXPEN (70-30) 100 UNIT/ML ~~LOC~~ SUPN
28.0000 [IU] | PEN_INJECTOR | Freq: Two times a day (BID) | SUBCUTANEOUS | 3 refills | Status: DC
Start: 2023-10-04 — End: 2024-02-11

## 2023-10-04 NOTE — Telephone Encounter (Signed)
 I have briefly spoken to PCP Gil Greig BRAVO, NP about E2C2 message and she states that patient hasn't been seen by her routinely since 2024. Patient was last seen by Jereld Delude, NP February 2025 for blood sugars and advised to follow up which she didn't do. PCP Gil Greig BRAVO, NP has agreed to refill insulin  so patient can have medication but she needs to be seen in office ASAP with her.

## 2023-10-04 NOTE — Telephone Encounter (Signed)
 Copied from CRM [id number]. Topic: Clinical - Medical Advice >> Oct 04, 2023  3:51 PM Susanna ORN wrote: Reason for CRM: Katheren, case manager, with [facility], calling in concern to patient's blood sugar levels. She states that she last spoke with patient on the 13th of June and her fasting blood sugar is always in the 300s. This morning she states the patient's blood sugar was in the 400s. Katheren states she's not sure if the patient is needing medication readjustments or if she may need to come back and see the doctor. Called [facility] & wasn't able to speak with a nurse after holding for several minutes. Please give her a call back [phone]. She states will be off tomorrow but if someone can leave her a message with an update & maybe give the patient a call to schedule an appt that would be great. She will call back on Monday to speak with a nurse as well.

## 2023-10-04 NOTE — Telephone Encounter (Signed)
 I have called patient case [employer] manager. She didn't answer so I left a detailed voicemail with office call back number. Message routed to PCP Gil Greig BRAVO, NP with pending insulin .

## 2023-10-13 LAB — OPHTHALMOLOGY REPORT-SCANNED

## 2023-11-08 ENCOUNTER — Other Ambulatory Visit (HOSPITAL_COMMUNITY): Payer: Self-pay

## 2023-11-08 ENCOUNTER — Other Ambulatory Visit: Payer: Self-pay

## 2023-11-30 ENCOUNTER — Other Ambulatory Visit: Payer: Self-pay

## 2023-12-01 ENCOUNTER — Other Ambulatory Visit: Payer: Self-pay | Admitting: Family

## 2023-12-06 ENCOUNTER — Other Ambulatory Visit: Payer: Self-pay

## 2023-12-17 ENCOUNTER — Other Ambulatory Visit: Payer: Self-pay

## 2023-12-25 ENCOUNTER — Telehealth: Payer: Self-pay | Admitting: Family

## 2023-12-25 NOTE — Telephone Encounter (Signed)
 Called to confirm/remind patient of their appointment at the [facility] on 12/26/23.   Appointment:   [] Confirmed  [] Left mess   [] No answer/No voice mail  [x] VM Full/unable to leave message  [] Phone not in service  Patient reminded to bring all medications and/or complete list.  Confirmed patient has transportation. Gave directions, instructed to utilize valet parking.

## 2023-12-25 NOTE — Progress Notes (Unsigned)
 Advanced Heart Failure Clinic Note      PCP: Gil No, NP (last seen 02/25) Primary Cardiologist: Redell Cave, MD (last seen 01/23)  Chief Complaint: shortness of breath   HPI:  Sharon Delgado is a 58 y/o female with a history of HTN, DM, hyperlipidemia, CAD (LHC done 09/22), tobacco use and heart failure.   Echo 12/07/20: EF of 35-40% along with mild MR.  LHC 12/10/20:  Prox RCA to Mid RCA lesion is 40% stenosed.   Mid RCA to Dist RCA lesion is 30% stenosed.   Prox Cx to Mid Cx lesion is 40% stenosed.  1.  Mild to moderate nonobstructive coronary artery disease 2.  Left ventricular angiography was not performed.  EF was moderately reduced by echo. 3.  High normal left ventricular end-diastolic pressure of 12 mmHg.  Echo 05/03/21: EF of 60-65%   Admitted 09/11/22 due to hyperglycemia with glucose of 1028. Placed on insulin  drip & home insulin  regimen changed.  Echo 11/30/22: EF 55-60% with Grade I DD, normal PA Pressure of 18.7 mmHg  She presents today for a HF follow-up visit with a chief complaint of shortness of breath and wheezing/ Occurs primarily at rest and has been daily for 2 weeks. Has used in inhaler daily which has minimally resolved symptoms. Has not taken any additional doses of lasix . Reports she has not taken Jardiance  in 2 weeks do to needing refill. No attempt to reach provider for refill.   Has associated dry cough. Denies chest pain, palpitations, abdominal distention, pedal edema or dizziness. Denies orthopnea and PND, positive sleep with 1 pillow. Denies continuous oxygen need.   Notes CBG's have been consistently in 200's and is taking medication as prescribed.   Occasional use of salt in meal, minimal amount. Drinks 3-4 water bottles a day.   Has not has sleep study done. States she needs to make appointment.   Daughter says that patient's glucose is still not controlled.           ROS: All systems negative except as listed in HPI, PMH and Problem  List.  SH:  Social History   Socioeconomic History   Marital status: Single    Spouse name: Not on file   Number of children: Not on file   Years of education: Not on file   Highest education level: 12th grade  Occupational History   Not on file  Tobacco Use   Smoking status: Former    Current packs/day: 0.00    Types: Cigarettes, Cigars    Quit date: 12/06/2020    Years since quitting: 3.0   Smokeless tobacco: Never  Vaping Use   Vaping status: Never Used  Substance and Sexual Activity   Alcohol use: Not Currently    Alcohol/week: 15.0 standard drinks of alcohol    Types: 15 Cans of beer per week   Drug use: No   Sexual activity: Not on file  Other Topics Concern   Not on file  Social History Narrative   Not on file   Social Drivers of Health   Financial Resource Strain: Low Risk  (05/22/2023)   Overall Financial Resource Strain (CARDIA)    Difficulty of Paying Living Expenses: Not hard at all  Food Insecurity: Food Insecurity Present (05/22/2023)   Hunger Vital Sign    Worried About Running Out of Food in the Last Year: Sometimes true    Ran Out of Food in the Last Year: Never true  Transportation Needs: No Transportation Needs (05/22/2023)  PRAPARE - Administrator, Civil Service (Medical): No    Lack of Transportation (Non-Medical): No  Physical Activity: Unknown (05/22/2023)   Exercise Vital Sign    Days of Exercise per Week: 0 days    Minutes of Exercise per Session: Not on file  Stress: Stress Concern Present (05/22/2023)   Sharon Delgado of Occupational Health - Occupational Stress Questionnaire    Feeling of Stress : To some extent  Social Connections: Moderately Isolated (05/22/2023)   Social Connection and Isolation Panel    Frequency of Communication with Friends and Family: More than three times a week    Frequency of Social Gatherings with Friends and Family: Once a week    Attends Religious Services: 1 to 4 times per year    Active  Member of [employer] or Organizations: No    Attends Banker Meetings: Not on file    Marital Status: Separated  Intimate Partner Violence: Not At Risk (09/11/2022)   Humiliation, Afraid, Rape, and Kick questionnaire    Fear of Current or Ex-Partner: No    Emotionally Abused: No    Physically Abused: No    Sexually Abused: No    FH:  Family History  Problem Relation Age of Onset   Diabetes Mother    Hypertension Mother    COPD Father    Alcoholism Father     Past Medical History:  Diagnosis Date   CHF (congestive heart failure) (HCC)    Hypertension     Vitals:   12/26/23 1058  BP: (!) 150/68  Pulse: (!) 57  SpO2: 97%  Weight: 98.9 kg   Wt Readings from Last 3 Encounters:  12/26/23 98.9 kg  09/19/23 96.2 kg  06/21/23 95.3 kg    Lab Results  Component Value Date   CREATININE 1.67 (H) 09/19/2023   CREATININE 1.38 (H) 06/21/2023   CREATININE 1.86 (H) 05/24/2023    PHYSICAL EXAM:  General: Well appearing. No acute signs of distress.  HEENT: Normal.  Neck: Supple, no JVD Cor: Regular rhythm, rate. No rubs, gallops or murmurs.  Lungs: Wheezing bilaterally. Symmetrical chest expansion.  Abdomen: Soft, nontender, nondistended. Extremities: No cyanosis, clubbing, rash, edema Neuro: AO X 3. Moves all 4 extremities w/o difficulty. Affect pleasant  ECG: not done  ASSESSMENT & PLAN:  1: NICM with preserved ejection fraction- - HF likely due to HTN as cath showed nonobstructive CAD - NYHA class II - euvolemic today - weighing daily; reminded to call for an overnight weight gain of > 2 pounds or a weekly weight gain of > 5 pounds - weight up 6 pounds since last here 3 months ago - Echo 12/07/20: EF of 35-40% along with mild MR.  - Echo 05/03/21: EF of 60-65% - Echo 11/30/22: EF 55-60% with Grade I DD, normal PA Pressure of 18.7 mmHg - not adding much salt and feels like she's doing better with her salt usage; emphasized the importance of not adding any salt  to her food - resume jardiance  10mg  daily - continue carvedilol  12.5mg  BID - continue furosemide  40mg  PRN  - continue isosorbide  MN 30mg  daily - continue entresto  24/26mg  BID - continue spironolactone  25mg  daily - BNP 09/11/22 was 24.8 - BMET and BNP today  2: HTN-  - BP 150/68 - saw PCP Sharon Delgado) 02/25 - BMP 09/19/23 reviewed: sodium 137, potassium 4.3, creatinine 1.67 and GFR 35 - BMET today  3: DM- - A1c 05/21/23 was >14% - glucose 09/19/23 was 270 - resume  jardiance  10mg  daily  - humalog 28 units BID w/ meals - emphasized discussing with PCP about endocrinology referral  4: CAD- - Lipid panel today  - LDL 05/24/23 was 86 - saw cardiology ([facility]-Etang) 01/23 - upcoming appointment with cardiology (Ag[facility]-Etang) 01/23/24 - continue atorvastatin  40mg  daily - LHC 12/10/20:  Prox RCA to Mid RCA lesion is 40% stenosed.   Mid RCA to Dist RCA lesion is 30% stenosed.   Prox Cx to Mid Cx lesion is 40% stenosed.  1.  Mild to moderate nonobstructive coronary artery disease 2.  Left ventricular angiography was not performed.  EF was moderately reduced by echo. 3.  High normal left ventricular end-diastolic pressure of 12 mmHg.  5: Snoring- - + snoring and apneic episodes - sleep study has not been scheduled yet - sleep center number provided on her AVS at last visit but she hasn't called; emphasized calling to get a sleep study scheduled.    Return in 3 months, sooner if needed.   Ellouise Class, FNP/Huckleberry Martinson, FNP-S 12/26/23

## 2023-12-26 ENCOUNTER — Other Ambulatory Visit (HOSPITAL_COMMUNITY): Payer: Self-pay

## 2023-12-26 ENCOUNTER — Ambulatory Visit: Attending: Family | Admitting: Family

## 2023-12-26 ENCOUNTER — Encounter: Payer: Self-pay | Admitting: Family

## 2023-12-26 ENCOUNTER — Other Ambulatory Visit: Payer: Self-pay

## 2023-12-26 VITALS — BP 150/68 | HR 57 | Wt 218.0 lb

## 2023-12-26 DIAGNOSIS — R0681 Apnea, not elsewhere classified: Secondary | ICD-10-CM | POA: Insufficient documentation

## 2023-12-26 DIAGNOSIS — I428 Other cardiomyopathies: Secondary | ICD-10-CM | POA: Insufficient documentation

## 2023-12-26 DIAGNOSIS — I10 Essential (primary) hypertension: Secondary | ICD-10-CM | POA: Diagnosis not present

## 2023-12-26 DIAGNOSIS — R062 Wheezing: Secondary | ICD-10-CM | POA: Diagnosis not present

## 2023-12-26 DIAGNOSIS — R0683 Snoring: Secondary | ICD-10-CM | POA: Diagnosis not present

## 2023-12-26 DIAGNOSIS — Z79899 Other long term (current) drug therapy: Secondary | ICD-10-CM | POA: Insufficient documentation

## 2023-12-26 DIAGNOSIS — N1832 Chronic kidney disease, stage 3b: Secondary | ICD-10-CM

## 2023-12-26 DIAGNOSIS — Z87891 Personal history of nicotine dependence: Secondary | ICD-10-CM | POA: Insufficient documentation

## 2023-12-26 DIAGNOSIS — I110 Hypertensive heart disease with heart failure: Secondary | ICD-10-CM | POA: Insufficient documentation

## 2023-12-26 DIAGNOSIS — E1122 Type 2 diabetes mellitus with diabetic chronic kidney disease: Secondary | ICD-10-CM

## 2023-12-26 DIAGNOSIS — E1165 Type 2 diabetes mellitus with hyperglycemia: Secondary | ICD-10-CM | POA: Insufficient documentation

## 2023-12-26 DIAGNOSIS — Z7984 Long term (current) use of oral hypoglycemic drugs: Secondary | ICD-10-CM | POA: Diagnosis not present

## 2023-12-26 DIAGNOSIS — R059 Cough, unspecified: Secondary | ICD-10-CM | POA: Diagnosis not present

## 2023-12-26 DIAGNOSIS — Z794 Long term (current) use of insulin: Secondary | ICD-10-CM | POA: Insufficient documentation

## 2023-12-26 DIAGNOSIS — I2510 Atherosclerotic heart disease of native coronary artery without angina pectoris: Secondary | ICD-10-CM | POA: Diagnosis not present

## 2023-12-26 DIAGNOSIS — I5032 Chronic diastolic (congestive) heart failure: Secondary | ICD-10-CM | POA: Diagnosis not present

## 2023-12-26 DIAGNOSIS — E782 Mixed hyperlipidemia: Secondary | ICD-10-CM | POA: Diagnosis not present

## 2023-12-26 MED ORDER — EMPAGLIFLOZIN 10 MG PO TABS
10.0000 mg | ORAL_TABLET | Freq: Every day | ORAL | 11 refills | Status: DC
  Filled 2023-12-26: qty 30, 30d supply, fill #0
  Filled 2024-01-15 – 2024-01-18 (×2): qty 30, 30d supply, fill #1

## 2023-12-26 MED ORDER — EMPAGLIFLOZIN 10 MG PO TABS: 10.0000 mg | ORAL_TABLET | Freq: Every day | ORAL | 11 refills | Status: DC

## 2023-12-26 NOTE — Patient Instructions (Addendum)
 If you receive a satisfaction survey regarding the Heart Failure Clinic, please take the time to fill it out. This way we can continue to provide excellent care and make any changes that need to be made.   Medication Changes:  REFILLED Jardiance  to [employer] order  Lab Work:  Go down to [facility] to [employer] have your blood work completed.  We will only call you if the results are abnormal or if the provider would like to make medication changes.  No news is good news.   Referrals:  You have been referred to have an in facility sleep study. They will be contacting you in order to schedule your sleep study.   Follow-Up in: Please follow up with the Advanced Heart Failure Clinic in 3 months with Ellouise Class, FNP.    Thank you for choosing [facility].    At the Advanced [facility] Failure Clinic, you and your health needs are our priority. We have a designated team specialized in the treatment of Heart Failure. This Care Team includes your primary Heart Failure Specialized Cardiologist (physician), Advanced Practice Providers (APPs- Physician Assistants and Nurse Practitioners), and Pharmacist who all work together to provide you with the care you need, when you need it.   You may see any of the following providers on your designated Care Team at your next follow up:  Dr. Toribio Fuel Dr. Ezra Shuck Dr. Ria Commander Dr. Morene Brownie Ellouise Class, FNP Jaun Bash, RPH-CPP  Please be sure to bring in all your medications bottles to every appointment.   Need to Contact Us :  If you have any questions or concerns before your next appointment please send us  a message through [place] or call our office at [phone].    TO LEAVE A MESSAGE FOR THE NURSE SELECT OPTION 2, PLEASE LEAVE A MESSAGE INCLUDING: YOUR NAME DATE OF BIRTH CALL BACK NUMBER REASON FOR CALL**this is important as we prioritize the call backs  YOU WILL  RECEIVE A CALL BACK THE SAME DAY AS LONG AS YOU CALL BEFORE 4:00 PM

## 2023-12-27 ENCOUNTER — Ambulatory Visit: Payer: Self-pay | Admitting: Family

## 2023-12-27 LAB — BASIC METABOLIC PANEL WITH GFR
BUN/Creatinine Ratio: 13 (ref 9–23)
BUN: 22 mg/dL (ref 6–24)
CO2: 24 mmol/L (ref 20–29)
Calcium: 8.8 mg/dL (ref 8.7–10.2)
Chloride: 98 mmol/L (ref 96–106)
Creatinine, Ser: 1.64 mg/dL — ABNORMAL HIGH (ref 0.57–1.00)
Glucose: 417 mg/dL — ABNORMAL HIGH (ref 70–99)
Potassium: 4.4 mmol/L (ref 3.5–5.2)
Sodium: 136 mmol/L (ref 134–144)
eGFR: 36 mL/min/1.73 — ABNORMAL LOW (ref >59)

## 2023-12-27 LAB — LIPID PANEL
Chol/HDL Ratio: 4.5 ratio — ABNORMAL HIGH (ref 0.0–4.4)
Cholesterol, Total: 143 mg/dL (ref 100–199)
HDL: 32 mg/dL — ABNORMAL LOW (ref >39)
LDL Chol Calc (NIH): 68 mg/dL (ref 0–99)
Triglycerides: 264 mg/dL — ABNORMAL HIGH (ref 0–149)
VLDL Cholesterol Cal: 43 mg/dL — ABNORMAL HIGH (ref 5–40)

## 2023-12-27 LAB — BRAIN NATRIURETIC PEPTIDE: BNP: 29.5 pg/mL (ref 0.0–100.0)

## 2023-12-28 ENCOUNTER — Other Ambulatory Visit: Payer: Self-pay | Admitting: Family

## 2023-12-31 MED ORDER — ATORVASTATIN CALCIUM 80 MG PO TABS: 80.0000 mg | ORAL_TABLET | Freq: Every day | ORAL | 3 refills | Status: DC

## 2023-12-31 NOTE — Telephone Encounter (Signed)
 Left voicemail for patient to call clinic to review results and provider recommendations. Atorvastatin  80 mg updated in chart and lab results faxed to pt's PCP per provider instructions.

## 2024-01-15 ENCOUNTER — Other Ambulatory Visit: Payer: Self-pay

## 2024-01-15 ENCOUNTER — Other Ambulatory Visit (HOSPITAL_COMMUNITY): Payer: Self-pay

## 2024-01-16 ENCOUNTER — Other Ambulatory Visit: Payer: Self-pay

## 2024-01-16 ENCOUNTER — Other Ambulatory Visit (HOSPITAL_COMMUNITY): Payer: Self-pay

## 2024-01-23 ENCOUNTER — Ambulatory Visit: Attending: Cardiology | Admitting: Cardiology

## 2024-01-31 ENCOUNTER — Encounter: Payer: Self-pay | Admitting: Family

## 2024-02-06 ENCOUNTER — Other Ambulatory Visit: Payer: Self-pay | Admitting: Family

## 2024-02-07 ENCOUNTER — Other Ambulatory Visit (HOSPITAL_COMMUNITY): Payer: Self-pay

## 2024-02-07 ENCOUNTER — Other Ambulatory Visit: Payer: Self-pay

## 2024-02-10 ENCOUNTER — Other Ambulatory Visit: Payer: Self-pay | Admitting: Orthopedic Surgery

## 2024-02-10 DIAGNOSIS — E1165 Type 2 diabetes mellitus with hyperglycemia: Secondary | ICD-10-CM

## 2024-02-11 ENCOUNTER — Other Ambulatory Visit: Payer: Self-pay | Admitting: Orthopedic Surgery

## 2024-02-11 DIAGNOSIS — E1165 Type 2 diabetes mellitus with hyperglycemia: Secondary | ICD-10-CM

## 2024-02-12 ENCOUNTER — Other Ambulatory Visit (HOSPITAL_COMMUNITY): Payer: Self-pay

## 2024-02-12 ENCOUNTER — Other Ambulatory Visit: Payer: Self-pay | Admitting: Orthopedic Surgery

## 2024-02-12 ENCOUNTER — Telehealth: Payer: Self-pay | Admitting: Family

## 2024-02-12 DIAGNOSIS — E1165 Type 2 diabetes mellitus with hyperglycemia: Secondary | ICD-10-CM

## 2024-02-15 ENCOUNTER — Telehealth: Payer: Self-pay

## 2024-02-15 NOTE — Telephone Encounter (Signed)
 Incoming fax received from pharmacy (unknown) to initiate a prior authorization for a medication that is not on active medication list: Novolin   Notation made on fax that patient is not taking this medication and faxed back to [phone]

## 2024-02-28 ENCOUNTER — Other Ambulatory Visit: Payer: Self-pay

## 2024-02-28 MED ORDER — EMPAGLIFLOZIN 10 MG PO TABS: 10.0000 mg | ORAL_TABLET | Freq: Every day | ORAL | 11 refills | Status: AC

## 2024-02-28 MED ORDER — CARVEDILOL 25 MG PO TABS: 12.5000 mg | ORAL_TABLET | Freq: Two times a day (BID) | ORAL | 1 refills | Status: DC

## 2024-02-28 MED ORDER — SACUBITRIL-VALSARTAN 24-26 MG PO TABS: 1.0000 | ORAL_TABLET | Freq: Two times a day (BID) | ORAL | 11 refills | Status: AC

## 2024-02-28 MED ORDER — SPIRONOLACTONE 25 MG PO TABS: 25.0000 mg | ORAL_TABLET | Freq: Every day | ORAL | 3 refills | Status: AC

## 2024-02-28 MED ORDER — ATORVASTATIN CALCIUM 80 MG PO TABS: 80.0000 mg | ORAL_TABLET | Freq: Every day | ORAL | 3 refills | Status: AC

## 2024-02-28 MED ORDER — ISOSORBIDE MONONITRATE ER 30 MG PO TB24: 30.0000 mg | ORAL_TABLET | Freq: Every day | ORAL | 3 refills | Status: AC

## 2024-03-05 ENCOUNTER — Other Ambulatory Visit: Payer: Self-pay | Admitting: Orthopedic Surgery

## 2024-03-05 MED ORDER — LANCETS MISC: 1.0000 | Freq: Three times a day (TID) | 0 refills | Status: DC

## 2024-03-05 MED ORDER — BLOOD GLUCOSE TEST VI STRP: 1.0000 | ORAL_STRIP | Freq: Three times a day (TID) | 0 refills | Status: AC

## 2024-03-05 MED ORDER — PEN NEEDLES 31G X 8 MM MISC: 1.0000 | Freq: Three times a day (TID) | 0 refills | Status: DC

## 2024-03-05 NOTE — Telephone Encounter (Signed)
 Copied from CRM #[id number]. Topic: Clinical - Medication Refill >> Mar 05, 2024  1:56 PM DeAngela L wrote: Medication: Glucose Blood (BLOOD GLUCOSE TEST STRIPS) STRP Insulin  Pen Needle (PEN NEEDLES) 31G X 8 MM MISC Lancets MISC   Has the patient contacted their pharmacy? No  (Agent: If no, request that the patient contact the pharmacy for the refill. If patient does not wish to contact the pharmacy document the reason why and proceed with request.) (Agent: If yes, when and what did the pharmacy advise?)  This is the patient's preferred pharmacy:  [phone] [address] [place] - [address] [place] [address] Phone: [phone] Fax: [phone]  Is this the correct pharmacy for this prescription? Yes If no, delete pharmacy and type the correct one.   Has the prescription been filled recently? Yes   Is the patient out of the medication? No   Has the patient been seen for an appointment in the last year OR does the patient have an upcoming appointment? Yes   Can we respond through MyChart? Yes   Agent: Please be advised that Rx refills may take up to 3 business days. We ask that you follow-up with your pharmacy.

## 2024-03-13 ENCOUNTER — Other Ambulatory Visit: Payer: Self-pay | Admitting: Adult Health

## 2024-03-13 DIAGNOSIS — J9600 Acute respiratory failure, unspecified whether with hypoxia or hypercapnia: Secondary | ICD-10-CM

## 2024-03-13 NOTE — Telephone Encounter (Unsigned)
 Copied from CRM #[id number]. Topic: Clinical - Medication Refill >> Mar 13, 2024 12:30 PM Debby BROCKS wrote: Medication: albuterol  (VENTOLIN  HFA) 108 (90 Base) MCG/ACT inhaler  Has the patient contacted their pharmacy? Yes (Agent: If no, request that the patient contact the pharmacy for the refill. If patient does not wish to contact the pharmacy document the reason why and proceed with request.) (Agent: If yes, when and what did the pharmacy advise?)  Pharmacy is the one calling in the request  This is the patient's preferred pharmacy:  [facility] [place] [address] [place] [address] Phone: [phone] Fax: [phone]  Is this the correct pharmacy for this prescription? Yes If no, delete pharmacy and type the correct one.   Has the prescription been filled recently? No  Is the patient out of the medication? N/A  Has the patient been seen for an appointment in the last year OR does the patient have an upcoming appointment? Yes  Can we respond through MyChart? Yes  Agent: Please be advised that Rx refills may take up to 3 business days. We ask that you follow-up with your pharmacy.

## 2024-03-14 MED ORDER — ALBUTEROL SULFATE HFA 108 (90 BASE) MCG/ACT IN AERS: 2.0000 | INHALATION_SPRAY | Freq: Four times a day (QID) | RESPIRATORY_TRACT | 0 refills | Status: DC | PRN

## 2024-03-18 ENCOUNTER — Telehealth: Payer: Self-pay

## 2024-03-18 NOTE — Telephone Encounter (Signed)
 Allergy/medication list faxed as requested

## 2024-03-18 NOTE — Telephone Encounter (Signed)
 Copied from CRM [id number]. Topic: Clinical - Medical Advice >> Mar 18, 2024 10:07 AM Carrielelia G wrote: Reason for CRM: Angie with [facility] is requesting the patient's Allergy List   Please advise

## 2024-03-21 ENCOUNTER — Other Ambulatory Visit (HOSPITAL_COMMUNITY): Payer: Self-pay

## 2024-03-21 MED ORDER — CARVEDILOL 25 MG PO TABS: 12.5000 mg | ORAL_TABLET | Freq: Two times a day (BID) | ORAL | 1 refills | Status: DC

## 2024-03-25 ENCOUNTER — Telehealth: Payer: Self-pay | Admitting: Family

## 2024-03-25 NOTE — Progress Notes (Unsigned)
 Advanced [facility] Failure Clinic Note      PCP: Gil No, NP (last seen 02/25) Primary Cardiologist: Redell Cave, MD (last seen 01/23)  Chief Complaint: shortness of breath   HPI:  Sharon Delgado is a 58 y/o female with a history of HTN, DM, hyperlipidemia, CAD (LHC done 09/22), tobacco use and heart failure.   Echo 12/07/20: EF of 35-40% along with mild MR.  LHC 12/10/20:  Prox RCA to Mid RCA lesion is 40% stenosed.   Mid RCA to Dist RCA lesion is 30% stenosed.   Prox Cx to Mid Cx lesion is 40% stenosed.  1.  Mild to moderate nonobstructive coronary artery disease 2.  Left ventricular angiography was not performed.  EF was moderately reduced by echo. 3.  High normal left ventricular end-diastolic pressure of 12 mmHg.  Echo 05/03/21: EF of 60-65%   Admitted 09/11/22 due to hyperglycemia with glucose of 1028. Placed on insulin  drip & home insulin  regimen changed.  Echo 11/30/22: EF 55-60% with Grade I DD, normal PA Pressure of 18.7 mmHg  She presents today for a HF follow-up visit with a chief complaint of shortness of breath and wheezing/ Occurs primarily at rest and has been daily for 2 weeks. Has used in inhaler daily which has minimally resolved symptoms. Has not taken any additional doses of lasix . Reports she has not taken Jardiance  in 2 weeks do to needing refill. No attempt to reach provider for refill.   Has associated dry cough. Denies chest pain, palpitations, abdominal distention, pedal edema or dizziness. Denies orthopnea and PND, positive sleep with 1 pillow. Denies continuous oxygen need.   Notes CBG's have been consistently in 200's and is taking medication as prescribed.   Occasional use of salt in meal, minimal amount. Drinks 3-4 water bottles a day.   Has not has sleep study done. States she needs to make appointment.   Daughter says that patient's glucose is still not controlled.           ROS: All systems negative except as listed in HPI, PMH and Problem  List.  SH:  Social History   Socioeconomic History   Marital status: Single    Spouse name: Not on file   Number of children: Not on file   Years of education: Not on file   Highest education level: 12th grade  Occupational History   Not on file  Tobacco Use   Smoking status: Former    Current packs/day: 0.00    Types: Cigarettes, Cigars    Quit date: 12/06/2020    Years since quitting: 3.3   Smokeless tobacco: Never  Vaping Use   Vaping status: Never Used  Substance and Sexual Activity   Alcohol use: Not Currently    Alcohol/week: 15.0 standard drinks of alcohol    Types: 15 Cans of beer per week   Drug use: No   Sexual activity: Not on file  Other Topics Concern   Not on file  Social History Narrative   Not on file   Social Drivers of Health   Tobacco Use: Medium Risk (12/26/2023)   Patient History    Smoking Tobacco Use: Former    Smokeless Tobacco Use: Never    Passive Exposure: Not on file  Financial Resource Strain: Low Risk (05/22/2023)   Overall Financial Resource Strain (CARDIA)    Difficulty of Paying Living Expenses: Not hard at all  Food Insecurity: Food Insecurity Present (05/22/2023)   Hunger Vital Sign    Worried About  Running Out of Food in the Last Year: Sometimes true    Ran Out of Food in the Last Year: Never true  Transportation Needs: No Transportation Needs (05/22/2023)   PRAPARE - Administrator, Civil Service (Medical): No    Lack of Transportation (Non-Medical): No  Physical Activity: Unknown (05/22/2023)   Exercise Vital Sign    Days of Exercise per Week: 0 days    Minutes of Exercise per Session: Not on file  Stress: Stress Concern Present (05/22/2023)   [employer] of Occupational Health - Occupational Stress Questionnaire    Feeling of Stress : To some extent  Social Connections: Moderately Isolated (05/22/2023)   Social Connection and Isolation Panel    Frequency of Communication with Friends and Family: More than  three times a week    Frequency of Social Gatherings with Friends and Family: Once a week    Attends Religious Services: 1 to 4 times per year    Active Member of [employer] or Organizations: No    Attends Engineer, Structural: Not on file    Marital Status: Separated  Intimate Partner Violence: Not At Risk (09/11/2022)   Humiliation, Afraid, Rape, and Kick questionnaire    Fear of Current or Ex-Partner: No    Emotionally Abused: No    Physically Abused: No    Sexually Abused: No  Depression (PHQ2-9): Low Risk (05/24/2023)   Depression (PHQ2-9)    PHQ-2 Score: 0  Alcohol Screen: Low Risk (05/22/2023)   Alcohol Screen    Last Alcohol Screening Score (AUDIT): 2  Housing: High Risk (05/22/2023)   Housing Stability Vital Sign    Unable to Pay for Housing in the Last Year: Yes    Number of Times Moved in the Last Year: Not on file    Homeless in the Last Year: No  Utilities: Not At Risk (09/11/2022)   AHC Utilities    Threatened with loss of utilities: No  Health Literacy: Not on file    FH:  Family History  Problem Relation Age of Onset   Diabetes Mother    Hypertension Mother    COPD Father    Alcoholism Father     Past Medical History:  Diagnosis Date   CHF (congestive heart failure) (HCC)    Hypertension     There were no vitals filed for this visit.  Wt Readings from Last 3 Encounters:  12/26/23 218 lb (98.9 kg)  09/19/23 212 lb (96.2 kg)  06/21/23 210 lb (95.3 kg)    Lab Results  Component Value Date   CREATININE 1.64 (H) 12/26/2023   CREATININE 1.67 (H) 09/19/2023   CREATININE 1.38 (H) 06/21/2023    PHYSICAL EXAM:  General: Well appearing. No acute signs of distress.  HEENT: Normal.  Neck: Supple, no JVD Cor: Regular rhythm, rate. No rubs, gallops or murmurs.  Lungs: Wheezing bilaterally. Symmetrical chest expansion.  Abdomen: Soft, nontender, nondistended. Extremities: No cyanosis, clubbing, rash, edema Neuro: AO X 3. Moves all 4 extremities w/o  difficulty. Affect pleasant  ECG: not done  ASSESSMENT & PLAN:  1: NICM with preserved ejection fraction- - HF likely due to HTN as cath showed nonobstructive CAD - NYHA class II - euvolemic today - weighing daily; reminded to call for an overnight weight gain of > 2 pounds or a weekly weight gain of > 5 pounds - weight up 6 pounds since last here 3 months ago - Echo 12/07/20: EF of 35-40% along with mild MR.  -  Echo 05/03/21: EF of 60-65% - Echo 11/30/22: EF 55-60% with Grade I DD, normal PA Pressure of 18.7 mmHg - not adding much salt and feels like she's doing better with her salt usage; emphasized the importance of not adding any salt to her food - resume jardiance  10mg  daily - continue carvedilol  12.5mg  BID - continue furosemide  40mg  PRN  - continue isosorbide  MN 30mg  daily - continue entresto  24/26mg  BID - continue spironolactone  25mg  daily - BNP 09/11/22 was 24.8 - BMET and BNP today  2: HTN-  - BP 150/68 - saw PCP Tamera) 02/25 - BMP 09/19/23 reviewed: sodium 137, potassium 4.3, creatinine 1.67 and GFR 35 - BMET today  3: DM- - A1c 05/21/23 was >14% - glucose 09/19/23 was 270 - resume jardiance  10mg  daily  - humalog 28 units BID w/ meals - emphasized discussing with PCP about endocrinology referral  4: CAD- - Lipid panel today  - LDL 05/24/23 was 86 - saw cardiology (Agbor-Etang) 01/23 - upcoming appointment with cardiology (Agbor-Etang) 01/23/24 - continue atorvastatin  40mg  daily - LHC 12/10/20:  Prox RCA to Mid RCA lesion is 40% stenosed.   Mid RCA to Dist RCA lesion is 30% stenosed.   Prox Cx to Mid Cx lesion is 40% stenosed.  1.  Mild to moderate nonobstructive coronary artery disease 2.  Left ventricular angiography was not performed.  EF was moderately reduced by echo. 3.  High normal left ventricular end-diastolic pressure of 12 mmHg.  5: Snoring- - + snoring and apneic episodes - sleep study has not been scheduled yet - sleep center number provided on her  AVS at last visit but she hasn't called; emphasized calling to get a sleep study scheduled.    Return in 3 months, sooner if needed.   Ellouise Class, FNP/Chiquasia Morris, FNP-S 12/26/23

## 2024-03-25 NOTE — Telephone Encounter (Signed)
 Called to confirm/remind patient of their appointment at the [facility] on 03/26/24.   Appointment:   [x] Confirmed  [] Left mess   [] No answer/No voice mail  [] VM Full/unable to leave message  [] Phone not in service  Patient reminded to bring all medications and/or complete list.  Confirmed patient has transportation. Gave directions, instructed to utilize valet parking.

## 2024-03-26 ENCOUNTER — Encounter: Payer: Self-pay | Admitting: Family

## 2024-03-26 ENCOUNTER — Ambulatory Visit: Attending: Family | Admitting: Family

## 2024-03-26 VITALS — BP 127/70 | HR 69 | Wt 219.5 lb

## 2024-03-26 DIAGNOSIS — I5032 Chronic diastolic (congestive) heart failure: Secondary | ICD-10-CM | POA: Diagnosis not present

## 2024-03-26 DIAGNOSIS — N1832 Chronic kidney disease, stage 3b: Secondary | ICD-10-CM | POA: Diagnosis not present

## 2024-03-26 DIAGNOSIS — I110 Hypertensive heart disease with heart failure: Secondary | ICD-10-CM | POA: Insufficient documentation

## 2024-03-26 DIAGNOSIS — E119 Type 2 diabetes mellitus without complications: Secondary | ICD-10-CM | POA: Insufficient documentation

## 2024-03-26 DIAGNOSIS — Z7984 Long term (current) use of oral hypoglycemic drugs: Secondary | ICD-10-CM | POA: Insufficient documentation

## 2024-03-26 DIAGNOSIS — E1122 Type 2 diabetes mellitus with diabetic chronic kidney disease: Secondary | ICD-10-CM

## 2024-03-26 DIAGNOSIS — I428 Other cardiomyopathies: Secondary | ICD-10-CM | POA: Diagnosis not present

## 2024-03-26 DIAGNOSIS — E785 Hyperlipidemia, unspecified: Secondary | ICD-10-CM | POA: Diagnosis not present

## 2024-03-26 DIAGNOSIS — R0683 Snoring: Secondary | ICD-10-CM | POA: Diagnosis not present

## 2024-03-26 DIAGNOSIS — I2510 Atherosclerotic heart disease of native coronary artery without angina pectoris: Secondary | ICD-10-CM | POA: Insufficient documentation

## 2024-03-26 DIAGNOSIS — I10 Essential (primary) hypertension: Secondary | ICD-10-CM | POA: Diagnosis not present

## 2024-03-26 DIAGNOSIS — Z87891 Personal history of nicotine dependence: Secondary | ICD-10-CM | POA: Diagnosis not present

## 2024-03-26 DIAGNOSIS — Z794 Long term (current) use of insulin: Secondary | ICD-10-CM | POA: Diagnosis not present

## 2024-03-26 MED ORDER — FUROSEMIDE 40 MG PO TABS: 40.0000 mg | ORAL_TABLET | Freq: Every day | ORAL | 5 refills | Status: AC | PRN

## 2024-03-26 NOTE — Patient Instructions (Signed)
 Medication Changes:  No medication changes today!  Lab Work:  Go downstairs to [facility] on [address] [address] to have your blood work completed.  We will only call you if the results are abnormal or if the provider would like to make medication changes.  No news is good news.  Follow-Up in: Please follow up with the Advanced [facility] Failure Clinic in 2 months with Dr. Zenaida.   Thank you for choosing  [facility].    At the [facility] Clinic, you and your health needs are our priority. We have a designated team specialized in the treatment of Heart Failure. This Care Team includes your primary Heart Failure Specialized Cardiologist (physician), Advanced Practice Providers (APPs- Physician Assistants and Nurse Practitioners), and Pharmacist who all work together to provide you with the care you need, when you need it.   You may see any of the following providers on your designated Care Team at your next follow up:  Dr. Toribio Fuel Dr. Ezra Shuck Dr. Ria Commander Dr. Morene Zenaida Ellouise Class, FNP Jaun Bash, RPH-CPP  Please be sure to bring in all your medications bottles to every appointment.   Need to Contact Us :  If you have any questions or concerns before your next appointment please send us  a message through [facility] or call our office at [phone].    TO LEAVE A MESSAGE FOR THE NURSE SELECT OPTION 2, PLEASE LEAVE A MESSAGE INCLUDING: YOUR NAME DATE OF BIRTH CALL BACK NUMBER REASON FOR CALL**this is important as we prioritize the call backs  YOU WILL RECEIVE A CALL BACK THE SAME DAY AS LONG AS YOU CALL BEFORE 4:00 PM

## 2024-03-27 ENCOUNTER — Ambulatory Visit: Payer: Self-pay | Admitting: Family

## 2024-03-27 ENCOUNTER — Telehealth: Payer: Self-pay

## 2024-03-27 LAB — BASIC METABOLIC PANEL WITH GFR
BUN/Creatinine Ratio: 22 (ref 12–28)
BUN: 37 mg/dL — ABNORMAL HIGH (ref 8–27)
CO2: 20 mmol/L (ref 20–29)
Calcium: 9.2 mg/dL (ref 8.7–10.3)
Chloride: 94 mmol/L — ABNORMAL LOW (ref 96–106)
Creatinine, Ser: 1.71 mg/dL — ABNORMAL HIGH (ref 0.57–1.00)
Glucose: 566 mg/dL (ref 70–99)
Potassium: 4.8 mmol/L (ref 3.5–5.2)
Sodium: 133 mmol/L — ABNORMAL LOW (ref 134–144)
eGFR: 34 mL/min/1.73 — ABNORMAL LOW (ref >59)

## 2024-03-27 NOTE — Telephone Encounter (Signed)
 Called and spoke to pt. Pt agreeable to reporting to the ED for high blood sugar.

## 2024-04-16 ENCOUNTER — Other Ambulatory Visit: Payer: Self-pay | Admitting: Adult Health

## 2024-04-16 DIAGNOSIS — J9600 Acute respiratory failure, unspecified whether with hypoxia or hypercapnia: Secondary | ICD-10-CM

## 2024-04-17 ENCOUNTER — Other Ambulatory Visit: Payer: Self-pay | Admitting: Orthopedic Surgery

## 2024-04-17 ENCOUNTER — Telehealth: Payer: Self-pay

## 2024-04-17 ENCOUNTER — Ambulatory Visit: Payer: Self-pay

## 2024-04-17 DIAGNOSIS — E1165 Type 2 diabetes mellitus with hyperglycemia: Secondary | ICD-10-CM

## 2024-04-17 MED ORDER — PEN NEEDLES 31G X 8 MM MISC: 1.0000 | Freq: Three times a day (TID) | 0 refills | Status: DC

## 2024-04-17 MED ORDER — INSULIN REGULAR HUMAN 100 UNIT/ML IJ SOLN: 28.0000 [IU] | Freq: Two times a day (BID) | INTRAMUSCULAR | 0 refills | Status: DC

## 2024-04-17 MED ORDER — NOVOLIN 70/30 FLEXPEN (70-30) 100 UNIT/ML ~~LOC~~ SUPN: 28.0000 [IU] | PEN_INJECTOR | Freq: Two times a day (BID) | SUBCUTANEOUS | 0 refills | Status: DC

## 2024-04-17 NOTE — Telephone Encounter (Signed)
 Pt and daughter called wanting a refill for insulin . States that she is out of the medication completely. Told pt that we do not manage her insulin  or diabetes. She insisted that we do. Per Ellouise Class, FNP, we have never prescribed insulin  nor are we able to do that today. Advised that PCP Gil has been refilling her insulin . Daughter states that it has been over a year since she has seen her PCP. Advised to follow up with PCP.

## 2024-04-17 NOTE — Telephone Encounter (Signed)
 Medication refilled

## 2024-04-17 NOTE — Telephone Encounter (Signed)
 Copied from CRM [id number]. Topic: Clinical - Medication Refill >> Apr 17, 2024  4:58 PM DeAngela L wrote: Medication: Insulin  Pen Needle (PEN NEEDLES) 31G X 8 MM MISC insulin  regular (HUMULIN R ) 100 units/mL injection   Has the patient contacted their pharmacy? Yes  (Agent: If no, request that the patient contact the pharmacy for the refill. If patient does not wish to contact the pharmacy document the reason why and proceed with request.) (Agent: If yes, when and what did the pharmacy advise?)  This is the patient's preferred pharmacy:  [phone] [address], [place] - [address] [place] [address] Phone: [phone] Fax: [phone] Is this the correct pharmacy for this prescription? Yes  If no, delete pharmacy and type the correct one.   Has the prescription been filled recently? Yes  Is the patient out of the medication? Yes, the patient has not taken her medication today   Has the patient been seen for an appointment in the last year OR does the patient have an upcoming appointment? No   Can we respond through MyChart? Yes   Agent: Please be advised that Rx refills may take up to 3 business days. We ask that you follow-up with your pharmacy.

## 2024-04-17 NOTE — Telephone Encounter (Signed)
 FYI Only or Action Required?: Action required by provider: update on patient condition and spoke with Provider, aware of refill req.  Patient was last seen in primary care on 05/24/2023 by Medina-Vargas, Jereld BROCKS, NP.  Called Nurse Triage reporting Blood Sugar Problem.  Symptoms began today.  Interventions attempted: Prescription medications: jaurdiance.  Symptoms are: stable.  Triage Disposition: Call PCP Now  Patient/caregiver understands and will follow disposition?: Yes    Copied from CRM [id number]. Topic: Clinical - Prescription Issue >> Apr 17, 2024  5:12 PM DeAngela L wrote: Reason for CRM: the patient daughter Camelia states she has not taken her insulin  today at all and the daughter states her blood sugar was high and it didn't register about hour or two ago,  but now the blood sugar has gone down a lot now, But she is concerned about the refill for her insulin  and asking if this can be completed as soon as possible so she can pick up the medication from the pharm today if possible so she can take the medication   [employer] num [phone]    Reason for Disposition  Blood glucose > 400 mg/dL (77.7 mmol/L)  Answer Assessment - Initial Assessment Questions Returned call to f/u on symptoms and rx request. Pt's daughter, Crystal, states that blood sugar was high and monitor would not register value. She states rechecked and blood sugar 304 at 420pm today. She states that her mom did take Jaurdiance but last humalog inj last night, 04/16/24 at 10:30pm. She states mom is not symptomatic but has not had any insulin  today. Reassured her that I would reach out to on call provider d/t medication being necessary and will f/u with her. She voiced appreciation.   On call provider made aware, Greig Cluster, NP. Rx verified and sent. Pt given one rx with no refills. Need A1C 3x a year, pt is to keep appt 05/01/24. No further refills will be sent until pt is seen.   Attempted to contact  pt's daughter to update her but NA and unable to LM as mailbox was full. Contacted pt directly and updated her that Rx was sent and she would need to keep appt. Verified appt 05/01/24 at 0940am. Pt voiced understanding.     1. BLOOD GLUCOSE: What is your blood glucose level?      304   2. ONSET: When did you check the blood glucose?     420pm today   3. USUAL RANGE: What is your glucose level usually? (e.g., usual fasting morning value, usual evening value)     Reports pt usually runs high; unable to provide range   5. TYPE 1 or 2:  Do you know what type of diabetes you have?  (e.g., Type 1, Type 2, Gestational; doesn't know)      Type II   6. INSULIN : Do you take insulin ? What type of insulin (s) do you use? What is the mode of delivery? (syringe, pen; injection or pump)?      Humalog 28 units BID   7. DIABETES PILLS: Do you take any pills for your diabetes? If Yes, ask: Have you missed taking any pills recently?     Jaurdiance   8. OTHER SYMPTOMS: Do you have any symptoms? (e.g., fever, frequent urination, difficulty breathing, dizziness, weakness, vomiting)     None  Protocols used: Diabetes - High Blood Sugar-A-AH

## 2024-04-17 NOTE — Progress Notes (Signed)
 Patient called requesting Novolin  Flexipen. Novolin  insulin  vials sent to pharmacy. New prescription sent for pens sent to pharmacy, [facility].

## 2024-04-18 MED ORDER — PEN NEEDLES 31G X 8 MM MISC: 1.0000 | Freq: Three times a day (TID) | 0 refills | Status: AC

## 2024-05-01 ENCOUNTER — Ambulatory Visit: Admitting: Orthopedic Surgery

## 2024-05-01 ENCOUNTER — Encounter: Payer: Self-pay | Admitting: Orthopedic Surgery

## 2024-05-01 VITALS — BP 110/72 | HR 67 | Temp 97.8°F | Ht 62.0 in | Wt 210.0 lb

## 2024-05-01 DIAGNOSIS — E782 Mixed hyperlipidemia: Secondary | ICD-10-CM | POA: Diagnosis not present

## 2024-05-01 DIAGNOSIS — N1832 Chronic kidney disease, stage 3b: Secondary | ICD-10-CM

## 2024-05-01 DIAGNOSIS — E66812 Obesity, class 2: Secondary | ICD-10-CM

## 2024-05-01 DIAGNOSIS — Z1231 Encounter for screening mammogram for malignant neoplasm of breast: Secondary | ICD-10-CM

## 2024-05-01 DIAGNOSIS — I5032 Chronic diastolic (congestive) heart failure: Secondary | ICD-10-CM | POA: Diagnosis not present

## 2024-05-01 DIAGNOSIS — Z794 Long term (current) use of insulin: Secondary | ICD-10-CM | POA: Diagnosis not present

## 2024-05-01 DIAGNOSIS — E1122 Type 2 diabetes mellitus with diabetic chronic kidney disease: Secondary | ICD-10-CM

## 2024-05-01 DIAGNOSIS — Z6838 Body mass index (BMI) 38.0-38.9, adult: Secondary | ICD-10-CM | POA: Diagnosis not present

## 2024-05-01 MED ORDER — NOVOLIN 70/30 FLEXPEN (70-30) 100 UNIT/ML ~~LOC~~ SUPN: 30.0000 [IU] | PEN_INJECTOR | Freq: Two times a day (BID) | SUBCUTANEOUS | 6 refills | Status: DC

## 2024-05-01 MED ORDER — FREESTYLE LIBRE 3 SENSOR MISC: 1.0000 | 11 refills | Status: DC

## 2024-05-01 MED ORDER — FREESTYLE LIBRE 3 READER DEVI: 1.0000 | Freq: Every day | 11 refills | Status: AC

## 2024-05-01 MED ORDER — CARVEDILOL 25 MG PO TABS: 12.5000 mg | ORAL_TABLET | Freq: Two times a day (BID) | ORAL | 1 refills | Status: AC

## 2024-05-01 NOTE — Progress Notes (Signed)
 "   Careteam: Patient Care Team: Sharon Sharon BRAVO, NP as PCP - General (Adult Health Nurse Practitioner) Darliss Rogue, MD as PCP - Cardiology (Cardiology)  Seen by: Sharon Sharon, AGNP-C  PLACE OF SERVICE:  [facility]  Advanced Directive information    Allergies[1]  Chief Complaint  Patient presents with   medication management of chronic issues    Due for foot exam   Blood Sugar Problem    GCM patient daughter requesting      HPI: Patient is a 59 y.o. female seen today for medical management of chronic conditions.   Discussed the use of AI scribe software for clinical note transcription with the patient, who gave verbal consent to proceed.  History of Present Illness   Sharon Sharon is a 59 year old female with diabetes who presents for diabetes management.  Daughter present during encounter.   She has been experiencing fluctuating blood sugar levels, with recent readings as high as 600 mg/dL when she ran out of insulin . Her fasting blood sugar levels have been around 160-170 mg/dL, but she has experienced levels over 400 mg/dL, indicated by her glucometer reading 'high'.  She is not always checking sugars before meals. She is currently on 28 units of 70/30 insulin  twice a day, taken after breakfast and dinner, and also takes Jardiance  for diabetes management. Her last A1c was 14%.  Her diet includes a mix of fruits, vegetables, meats, breads, and desserts. She drinks juice daily, particularly orange juice in the morning.     Review of Systems:  Review of Systems  Constitutional: Negative.   HENT: Negative.    Eyes: Negative.   Respiratory: Negative.    Cardiovascular: Negative.   Gastrointestinal: Negative.   Genitourinary: Negative.   Musculoskeletal: Negative.   Skin: Negative.   Neurological: Negative.   Endo/Heme/Allergies:  Negative for polydipsia.  Psychiatric/Behavioral: Negative.      Past Medical History:  Diagnosis Date   CHF (congestive heart  failure) (HCC)    Hypertension    Past Surgical History:  Procedure Laterality Date   LEFT HEART CATH AND CORONARY ANGIOGRAPHY N/A 12/10/2020   Procedure: LEFT HEART CATH AND CORONARY ANGIOGRAPHY;  Surgeon: Darron Deatrice LABOR, MD;  Location: [facility] INVASIVE CV LAB;  Service: Cardiovascular;  Laterality: N/A;   NO PAST SURGERIES     Social History:   reports that she quit smoking about 3 years ago. Her smoking use included cigarettes and cigars. She has never used smokeless tobacco. She reports that she does not currently use alcohol after a past usage of about 15.0 standard drinks of alcohol per week. She reports that she does not use drugs.  Family History  Problem Relation Age of Onset   Diabetes Mother    Hypertension Mother    COPD Father    Alcoholism Father     Medications: Patient's Medications  New Prescriptions   No medications on file  Previous Medications   ACCU-CHEK GUIDE TEST TEST STRIP    1 each by Other route 2 (two) times daily.   ACETAMINOPHEN  (TYLENOL ) 500 MG TABLET    Take 500-1,000 mg by mouth 2 (two) times daily as needed for mild pain, moderate pain or headache.   ALBUTEROL  (VENTOLIN  HFA) 108 (90 BASE) MCG/ACT INHALER    TAKE 2 PUFFS BY MOUTH EVERY 6 HOURS AS NEEDED FOR WHEEZE OR SHORTNESS OF BREATH   ASPIRIN  EC 81 MG TABLET    Take 81 mg by mouth daily.   ATORVASTATIN  (LIPITOR)  80 MG TABLET    Take 1 tablet (80 mg total) by mouth daily.   BLOOD GLUCOSE MONITORING SUPPL DEVI    1 each by Does not apply route 3 (three) times daily. May dispense any manufacturer covered by patient's insurance.   EMPAGLIFLOZIN  (JARDIANCE ) 10 MG TABS TABLET    Take 1 tablet (10 mg total) by mouth daily before breakfast.   FUROSEMIDE  (LASIX ) 40 MG TABLET    Take 1 tablet (40 mg total) by mouth daily as needed. For swelling.   GLUCOSE BLOOD (BLOOD GLUCOSE TEST STRIPS) STRP    1 each by Does not apply route 3 (three) times daily. Use as directed to check blood sugar. May dispense any  manufacturer covered by patient's insurance and fits patient's device.   INSULIN  PEN NEEDLE (PEN NEEDLES) 31G X 8 MM MISC    1 each by Does not apply route 3 (three) times daily. May dispense any manufacturer covered by patient's insurance.   INSULIN  PEN NEEDLE (PEN NEEDLES) 31G X 8 MM MISC    1 each by Does not apply route 3 (three) times daily. May dispense any manufacturer covered by patient's insurance.   ISOSORBIDE  MONONITRATE (IMDUR ) 30 MG 24 HR TABLET    Take 1 tablet (30 mg total) by mouth daily. Please keep appointment on 12/05/2021 for future refills.   LANCET DEVICE MISC    1 each by Does not apply route 3 (three) times daily. May dispense any manufacturer covered by patient's insurance.   LANCETS MISC    1 each by Does not apply route 3 (three) times daily. Use as directed to check blood sugar. May dispense any manufacturer covered by patient's insurance and fits patient's device.   SACUBITRIL -VALSARTAN  (ENTRESTO ) 24-26 MG    Take 1 tablet by mouth 2 (two) times daily.   SPIRONOLACTONE  (ALDACTONE ) 25 MG TABLET    Take 1 tablet (25 mg total) by mouth daily.  Modified Medications   Modified Medication Previous Medication   CARVEDILOL  (COREG ) 25 MG TABLET carvedilol  (COREG ) 25 MG tablet      Take 0.5 tablets (12.5 mg total) by mouth 2 (two) times daily with a meal.    Take 0.5 tablets (12.5 mg total) by mouth 2 (two) times daily with a meal.   CONTINUOUS GLUCOSE RECEIVER (FREESTYLE LIBRE 3 READER) DEVI Continuous Glucose Receiver (FREESTYLE LIBRE 3 READER) DEVI      1 Device by Does not apply route daily.    1 Device by Does not apply route daily.   CONTINUOUS GLUCOSE SENSOR (FREESTYLE LIBRE 3 SENSOR) MISC Continuous Glucose Sensor (FREESTYLE LIBRE 3 SENSOR) MISC      1 Device by Does not apply route every 14 (fourteen) days. Place 1 sensor on the skin every 14 days. Use to check glucose continuously    1 Device by Does not apply route every 14 (fourteen) days. Place 1 sensor on the skin every  14 days. Use to check glucose continuously   INSULIN  ISOPHANE & REGULAR HUMAN KWIKPEN (NOVOLIN  70/30 KWIKPEN) (70-30) 100 UNIT/ML KWIKPEN insulin  isophane & regular human KwikPen (NOVOLIN  70/30 KWIKPEN) (70-30) 100 UNIT/ML KwikPen      Inject 30 Units into the skin in the morning and at bedtime.    Inject 28 Units into the skin in the morning and at bedtime.  Discontinued Medications   HUMULIN R  100 UNIT/ML INJECTION    Inject 28 Units into the skin 2 (two) times daily before a meal.    Physical Exam:  Vitals:   05/01/24 0957  BP: 110/72  Pulse: 67  Temp: 97.8 F (36.6 C)  SpO2: 96%  Weight: 210 lb (95.3 kg)  Height: 5' 2 (1.575 m)   Body mass index is 38.41 kg/m. Wt Readings from Last 3 Encounters:  05/01/24 210 lb (95.3 kg)  03/26/24 219 lb 8 oz (99.6 kg)  12/26/23 218 lb (98.9 kg)    Physical Exam Vitals reviewed.  Constitutional:      Appearance: She is obese.  HENT:     Head: Normocephalic.  Eyes:     General:        Right eye: No discharge.        Left eye: No discharge.  Cardiovascular:     Rate and Rhythm: Normal rate and regular rhythm.     Pulses: Normal pulses.     Heart sounds: Normal heart sounds.  Pulmonary:     Effort: Pulmonary effort is normal.     Breath sounds: Normal breath sounds.  Abdominal:     General: Bowel sounds are normal.     Palpations: Abdomen is soft.  Musculoskeletal:     Cervical back: Neck supple.     Right lower leg: No edema.     Left lower leg: No edema.  Feet:     Right foot:     Skin integrity: Dry skin present.     Toenail Condition: Right toenails are normal.     Left foot:     Toenail Condition: Left toenails are normal.  Skin:    General: Skin is warm.     Capillary Refill: Capillary refill takes less than 2 seconds.  Neurological:     General: No focal deficit present.     Mental Status: She is alert and oriented to person, place, and time.     Gait: Gait normal.  Psychiatric:        Mood and Affect: Mood  normal.     Labs reviewed: Basic Metabolic Panel: Recent Labs    09/19/23 1202 12/26/23 1151 03/26/24 1046  NA 137 136 133*  K 4.3 4.4 4.8  CL 103 98 94*  CO2 25 24 20   GLUCOSE 270* 417* 566*  BUN 25* 22 37*  CREATININE 1.67* 1.64* 1.71*  CALCIUM  8.8* 8.8 9.2   Liver Function Tests: Recent Labs    05/21/23 1046  AST 21  ALT 28  BILITOT 0.6  PROT 8.0   No results for input(s): LIPASE, AMYLASE in the last 8760 hours. No results for input(s): AMMONIA in the last 8760 hours. CBC: Recent Labs    05/21/23 1046  WBC 8.0  NEUTROABS 4,912  HGB 12.9  HCT 41.5  MCV 90.0  PLT 210   Lipid Panel: Recent Labs    05/24/23 1055 12/26/23 1151  CHOL 147 143  HDL 34* 32*  LDLCALC 86 68  TRIG 174* 264*  CHOLHDL 4.3 4.5*   TSH: No results for input(s): TSH in the last 8760 hours. A1C: Lab Results  Component Value Date   HGBA1C >14.0 (H) 05/21/2023     Assessment/Plan 1. Type 2 diabetes mellitus with stage 3b chronic kidney disease, with long-term current use of insulin  (HCC) - A1c 14 05/21/2023 - sugars varying from 200-400's - unclear if sugars are fasting  - no hypoglycemias - [facility] referral for education/glucose management - will increase insulin  to 30 units> discussed rule of 2> may increase insulin  by 2 units every 3 days if sugars > 200 - recommend diet low  in sugar and carbs  - eye exam done at [facility] in [place]  - Continuous Glucose Receiver (FREESTYLE LIBRE 3 READER) DEVI; 1 Device by Does not apply route daily.  Dispense: 1 each; Refill: 11 - Continuous Glucose Sensor (FREESTYLE LIBRE 3 SENSOR) MISC; 1 Device by Does not apply route every 14 (fourteen) days. Place 1 sensor on the skin every 14 days. Use to check glucose continuously  Dispense: 2 each; Refill: 11 - insulin  isophane & regular human KwikPen (NOVOLIN  70/30 KWIKPEN) (70-30) 100 UNIT/ML KwikPen; Inject 30 Units into the skin in the morning and at bedtime.  Dispense: 15 mL;  Refill: 6 - Hemoglobin A1c - Basic Metabolic Panel with eGFR - Microalbumin/Creatinine Ratio, Urine - AMB Referral [facility] Care Management  2. Chronic diastolic CHF (congestive heart failure) (HCC) (Primary) - followed by cardiology - no weight gain, cough, edema - cont Entresto , Jardiance , spirolactone and furosemide  prn  3. Mixed hyperlipidemia - LDL 86, goal < 70 - cont atorvastatin  - Lipid Panel  4. Screening mammogram for breast cancer - MM Digital Screening; Future  5. Class 2 severe obesity due to excess calories with serious comorbidity and body mass index (BMI) of 38.0 to 38.9 in adult - BMI 38.41 - diagnosis of T2DM, HTN, HLD, CHF - recommend < 1500 calories per day - recommend 150 minutes light exercise daily   Total time: 37 minutes. Greater than 50% of total time spent doing patient education regarding health maintenance, T2DM, HTN, CHF and weight loss including symptom/medication management.      Next appt: 07/31/2024  Sharon Sharon BODILY  [facility] [phone]     [1] No Known Allergies  "

## 2024-05-01 NOTE — Patient Instructions (Addendum)
 Increase insulin  to 30 units   Goal sugars to be around 200> if sugars 200 x 3 days> increase insulin  by 2 units   Limit sugars and carbs in diet  We need to have you come in every 3 months until sugars/ diabetes control is better  [facility] for mammogram > [phone]  ALWAYS CHECK SUGARS BEFORE EATING OR WHEN FIRST WAKE UP TO SEE WHAT TRUE SUGAR IS

## 2024-05-02 ENCOUNTER — Ambulatory Visit: Payer: Self-pay | Admitting: Orthopedic Surgery

## 2024-05-02 LAB — LIPID PANEL
Cholesterol: 131 mg/dL
HDL: 36 mg/dL — ABNORMAL LOW
LDL Cholesterol (Calc): 77 mg/dL
Non-HDL Cholesterol (Calc): 95 mg/dL
Total CHOL/HDL Ratio: 3.6 (calc)
Triglycerides: 99 mg/dL

## 2024-05-02 LAB — BASIC METABOLIC PANEL WITHOUT GFR
BUN/Creatinine Ratio: 18 (calc) (ref 6–22)
BUN: 25 mg/dL (ref 7–25)
CO2: 29 mmol/L (ref 20–32)
Calcium: 9.2 mg/dL (ref 8.6–10.4)
Chloride: 103 mmol/L (ref 98–110)
Creat: 1.36 mg/dL — ABNORMAL HIGH (ref 0.50–1.05)
Glucose, Bld: 150 mg/dL — ABNORMAL HIGH (ref 65–99)
Potassium: 4.1 mmol/L (ref 3.5–5.3)
Sodium: 140 mmol/L (ref 135–146)

## 2024-05-02 LAB — MICROALBUMIN / CREATININE URINE RATIO
Creatinine, Urine: 139 mg/dL (ref 20–275)
Microalb Creat Ratio: 37 mg/g{creat} — ABNORMAL HIGH
Microalb, Ur: 5.1 mg/dL

## 2024-05-02 LAB — HEMOGLOBIN A1C: Hgb A1c MFr Bld: >14 % — ABNORMAL HIGH

## 2024-05-05 ENCOUNTER — Telehealth: Payer: Self-pay | Admitting: Pharmacist

## 2024-05-05 DIAGNOSIS — E1121 Type 2 diabetes mellitus with diabetic nephropathy: Secondary | ICD-10-CM

## 2024-05-05 DIAGNOSIS — N1832 Chronic kidney disease, stage 3b: Secondary | ICD-10-CM

## 2024-05-05 MED ORDER — FREESTYLE LIBRE 3 SENSOR MISC: 1.0000 | 11 refills | Status: AC

## 2024-05-05 NOTE — Progress Notes (Signed)
 "  05/05/2024 Name: Sharon Delgado MRN: [id number] DOB: [birth year]  Chief Complaint  Patient presents with   Medication Management    Diabetes     Sharon Delgado is a 59 y.o. year old female who presented for a telephone visit.   They were referred to the pharmacist by their PCP for assistance in managing diabetes.    Subjective: Patient is a 59 year old female with multiple medical conditions including but not limited to: congested heart failure, chronic kidney disease stage 2, hyperlipidemia, hypertension, obesity, tobacco abuse, and type 2 diabetes.    Care Team: Primary Care Provider: Gil Greig BRAVO, NP ; Next Scheduled Visit: 07/31/24   Medication Access/Adherence  [facility] Pharmacy[facility]  [facility] [address] [place] - [address] [place] [address] Phone: [phone] Fax: [phone]  [facility] - [facility] [address] [place] [address] Phone: [phone] Fax: [phone]  DARRYLE LONG - [facility] [address] [place] [address] Phone: [phone] Fax: [phone]  [id number] [place] [place] [facility] [address] [place] [address] Phone: [phone] Fax: [phone]  [facility] - [place], [place] - [address] [place] [address] Phone: [phone] Fax: [phone]   Patient reports affordability concerns with their medications: No  Patient reports access/transportation concerns to their pharmacy: No  Patient reports adherence concerns with their medications:  Yes   (Reported some of her prescriptions got cancelled and she was without them.  Novolin  70/30 as an example was filled in October  of 2025 but not again until January 2026.   Diabetes:  Current medications:   Jardiance  10 mg 1 tablet daily Novolin  70/30 30 units twice daily (recently increased)  Current glucose readings:   208,239,129,141,165,178  Macrovascular and Microvascular Risk Reduction:  Statin? yes (atorvastatin  80 mg) ACEi/ARB? Entresto  24/26 Last urinary albumin/creatinine ratio:  Lab Results  Component Value Date   MICRALBCREAT 37 (H) 05/01/2024   MICRALBCREAT 50 (H) 11/09/2022   Last eye exam:  Lab Results  Component Value Date   HMDIABEYEEXA No Retinopathy 10/13/2023   Last foot exam: 05/01/2024 Tobacco Use:  Tobacco Use: Medium Risk (05/01/2024)   Patient History    Smoking Tobacco Use: Former    Smokeless Tobacco Use: Never    Passive Exposure: Not on file     Objective:  Lab Results  Component Value Date   HGBA1C >14.0 (H) 05/01/2024    Lab Results  Component Value Date   CREATININE 1.36 (H) 05/01/2024   BUN 25 05/01/2024   NA 140 05/01/2024   K 4.1 05/01/2024   CL 103 05/01/2024   CO2 29 05/01/2024    Lab Results  Component Value Date   CHOL 131 05/01/2024   HDL 36 (L) 05/01/2024   LDLCALC 77 05/01/2024   TRIG 99 05/01/2024   CHOLHDL 3.6 05/01/2024    Medications Reviewed Today     Reviewed by Jolee Cassius PARAS, RPH (Pharmacist) on 05/05/24 at 1231  Med List Status: <None>   Medication Order Taking? Sig Documenting Provider Last Dose Status Informant  ACCU-CHEK GUIDE TEST test strip [id number] Yes 1 each by Other route 2 (two) times daily. [provider]  Active   acetaminophen  (TYLENOL ) 500 MG tablet [id number] Yes Take 500-1,000 mg by mouth 2 (two) times daily as needed for mild pain, moderate pain or headache. [provider]  Active   albuterol  (VENTOLIN  HFA) 108 (90 Base) MCG/ACT inhaler [id number] Yes TAKE 2 PUFFS BY MOUTH EVERY 6 HOURS AS NEEDED FOR WHEEZE OR SHORTNESS OF BREATH Fargo, Amy E, NP  Active   aspirin  EC 81 MG tablet [id number] Yes Take 81 mg by mouth daily. [provider]  Active Self, Family Member, Pharmacy Records  atorvastatin  (LIPITOR) 80 MG tablet [id number] Yes Take 1 tablet (80 mg total) by mouth daily.  Donette Ellouise LABOR, FNP  Active   Blood Glucose Monitoring Suppl DEVI [id number] Yes 1 each by Does not apply route 3 (three) times daily. May dispense any manufacturer covered by patient's insurance. Maree Hue, MD  Active   carvedilol  (COREG ) 25 MG tablet [id number] Yes Take 0.5 tablets (12.5 mg total) by mouth 2 (two) times daily with a meal. Gil, Amy E, NP  Active   Continuous Glucose Receiver (FREESTYLE LIBRE 3 READER) DEVI [id number] Yes 1 Device by Does not apply route daily. Fargo, Amy E, NP  Active   Continuous Glucose Sensor (FREESTYLE LIBRE 3 SENSOR) [place] [id number]  1 Device by Does not apply route every 14 (fourteen) days. Place 1 sensor on the skin every 14 days. Use to check glucose continuously  Patient not taking: Reported on 05/05/2024   Gil No E, NP  Active   empagliflozin  (JARDIANCE ) 10 MG TABS tablet [id number] Yes Take 1 tablet (10 mg total) by mouth daily before breakfast. Donette Ellouise A, FNP  Active   furosemide  (LASIX ) 40 MG tablet [id number] Yes Take 1 tablet (40 mg total) by mouth daily as needed. For swelling. Donette Ellouise A, FNP  Active   Glucose Blood (BLOOD GLUCOSE TEST STRIPS) STRP [id number] Yes 1 each by Does not apply route 3 (three) times daily. Use as directed to check blood sugar. May dispense any manufacturer covered by patient's insurance and fits patient's device. Fargo, Amy E, NP  Active   insulin  isophane & regular human KwikPen (NOVOLIN  70/30 KWIKPEN) (70-30) 100 UNIT/ML KwikPen [id number] Yes Inject 30 Units into the skin in the morning and at bedtime. Fargo, Amy E, NP  Active   Insulin  Pen Needle (PEN NEEDLES) 31G X 8 MM MISC [id number] Yes 1 each by Does not apply route 3 (three) times daily. May dispense any manufacturer covered by patient's insurance. Fargo, Amy E, NP  Active   isosorbide  mononitrate (IMDUR ) 30 MG 24 hr tablet [id number] Yes Take 1 tablet (30 mg total) by mouth daily. Please keep appointment on 12/05/2021 for future refills. Donette Ellouise LABOR, FNP   Active   Lancet Device MISC [id number] Yes 1 each by Does not apply route 3 (three) times daily. May dispense any manufacturer covered by patient's insurance. Maree Hue, MD  Active            Med Note LUCIO ELSIE CHRISTELLA [place] Nov 30, 2022 11:06 AM) Has CGM but occasionally does need to stick self  sacubitril -valsartan  (ENTRESTO ) 24-26 MG [id number] Yes Take 1 tablet by mouth 2 (two) times daily. Donette Ellouise A, FNP  Active   spironolactone  (ALDACTONE ) 25 MG tablet [id number] Yes Take 1 tablet (25 mg total) by mouth daily. Donette Ellouise LABOR, [place]  Active                 05/01/2024    9:57 AM 03/26/2024   10:15 AM 12/26/2023   10:58 AM  Vitals with BMI  Height 5' 2    Weight 210 lbs 219 lbs 8 oz 218 lbs  BMI 38.4    Systolic 110 127 849  Diastolic 72 70 68  Pulse 67 69 57     Assessment/Plan:   Diabetes: - Currently uncontrolled; goal A1c <7%. (14%) Cardiorenal risk reduction is optimized.. Blood pressure is at goal <130/80. LDL is at goal.  - consider adding GLP1 therapy-Ozempic or Mounjaro.  Patient denied history of thyroid cancer, pancreatitis, or gastroparesis. -Consider increasing Novolin  70/30 dose to 32 units twice daily  Follow Up Plan:    Send note back to Provider with suggestions. Send in new prescription for Freestyle Glucose Sensors Follow up with Patient in 1 week.   Cassius DOROTHA Brought, PharmD, BCACP Clinical Pharmacist [phone]     "

## 2024-05-07 ENCOUNTER — Telehealth: Payer: Self-pay | Admitting: Pharmacist

## 2024-05-07 DIAGNOSIS — N1832 Chronic kidney disease, stage 3b: Secondary | ICD-10-CM

## 2024-05-07 DIAGNOSIS — E1121 Type 2 diabetes mellitus with diabetic nephropathy: Secondary | ICD-10-CM

## 2024-05-07 MED ORDER — NOVOLIN 70/30 FLEXPEN (70-30) 100 UNIT/ML ~~LOC~~ SUPN: 32.0000 [IU] | PEN_INJECTOR | Freq: Two times a day (BID) | SUBCUTANEOUS | Status: AC

## 2024-05-07 NOTE — Progress Notes (Signed)
" ° °  05/07/2024 Name: Sharon Delgado MRN: [id number] DOB: 15-Jan-1966  Chief Complaint  Patient presents with   Medication Management    Diabetes     Sharon Delgado is a 59 y.o. year old female who presented for a telephone visit.   They were referred to the pharmacist by their PCP for assistance in managing diabetes.    Subjective: Patient is a 59 year old female with multiple medical conditions including but not limited to: congested heart failure, chronic kidney disease stage 2, hyperlipidemia, hypertension, obesity, tobacco abuse, and type 2 diabetes.    Care Team: Primary Care Provider: Gil Greig BRAVO, NP ; Next Scheduled Visit: 07/31/24   Medication Access/Adherence  [facility]  [facility] [address], [place] - [address] [place] [address] Phone: [phone] Fax: [phone]  [facility] - [facility] [address] [place] [address] Phone: [phone] Fax: [phone]  [facility] LONG - [facility] [address] [address] Phone: [phone] Fax: [phone]  [id number] [place], [place] [address] [place] [address] Phone: [phone] Fax: [phone]  [facility] - [place], [place] - [address] [place] [address] Phone: [phone] Fax: [phone]   Patient reports affordability concerns with their medications: No  Patient reports access/transportation concerns to their pharmacy: No  Patient reports adherence concerns with their medications:  Yes  Reported her medications were cancelled and she was without insulin  for a few months last year.    Objective:  Lab Results  Component Value Date   HGBA1C >14.0 (H) 05/01/2024    Lab Results  Component Value Date   CREATININE 1.36 (H) 05/01/2024   BUN 25 05/01/2024   NA 140 05/01/2024   K 4.1 05/01/2024   CL 103 05/01/2024   CO2 29 05/01/2024     Lab Results  Component Value Date   CHOL 131 05/01/2024   HDL 36 (L) 05/01/2024   LDLCALC 77 05/01/2024   TRIG 99 05/01/2024   CHOLHDL 3.6 05/01/2024          05/01/2024    9:57 AM 03/26/2024   10:15 AM 12/26/2023   10:58 AM  Vitals with BMI  Height 5' 2    Weight 210 lbs 219 lbs 8 oz 218 lbs  BMI 38.4    Systolic 110 127 849  Diastolic 72 70 68  Pulse 67 69 57     Assessment/Plan:   Diabetes: - Currently uncontrolled; goal A1c <7%. Cardiorenal risk reduction is optimized.. Blood pressure is at goal <130/80. LDL is at goal.  - Considering starting GLP-1--Patient reports daughter had thyroid removed; etiology unclear. Patient denies personal history of thyroid cancer, medullary thyroid cancer, or MEN2. Reviewed that GLP-1 therapy is contraindicated only with personal or family history of MTC or MEN2.  Patient sent her daughter a text message to find out if she knew why her thyroid was removed with an actual diagnosis.  Increase Novolin  70/30 dose from 30 units twice daily to 32 units twice daily as was consulted with her PCP.     Follow Up Plan:    Follow back up with Patient about her Daughter's thyroid removal. Follow up on blood sugars. Call Patient back in 5-7 business days.  Cassius DOROTHA Brought, PharmD, [facility] Clinical Pharmacist [facility] [phone]         "

## 2024-05-07 NOTE — Telephone Encounter (Signed)
-----   Message from Greig Cluster, NP sent at 05/06/2024 11:45 AM EST ----- I am so happy they spoke with you. Starting GLP would be fine. Appreciate your help with this case. They were hesitant to start another medication when I saw them. ----- Message ----- From: Jolee Cassius PARAS, [facility] Sent: 05/05/2024   2:32 PM EST To: Greig FORBES Cluster, NP  Hello!  Thank you for referring Sharon Delgado. I spoke with her today. I would like to start her on GLP1 therapy and increase her insulin  dose by 2 units.  She reported compliance but did not have her insulin  filled from 10/25- 1/25 (so she missed fills in October and November).  Please let me know your thoughts. I will call the patient back within a week with therapy changes and then follow her very closely thereafter to help her gain better glucose control.  Blessings,  Cassius DOROTHA Jolee, PharmD, BCACP Clinical Pharmacist [phone]

## 2024-05-13 ENCOUNTER — Telehealth: Payer: Self-pay | Admitting: Pharmacist

## 2024-05-13 DIAGNOSIS — E1121 Type 2 diabetes mellitus with diabetic nephropathy: Secondary | ICD-10-CM

## 2024-05-13 NOTE — Progress Notes (Signed)
" ° °  05/13/2024 Name: Sharon Delgado MRN: [id number] DOB: 08-19-1965  Chief Complaint  Patient presents with   Medication Management    Diabetes      Patient was called regarding starting GLP-1 thearpy.  She reported that her Daughter had thyroid surgery and that she thought it was cancer but was unsure.    Patient denies personal history of thyroid cancer, medullary thyroid cancer, or MEN2. Reviewed that GLP-1 therapy is contraindicated only with personal or family history of MTC or MEN2.  Patient sent her daughter a text message to find out if she knew why her thyroid was removed with an actual diagnosis.   The purpose of today's call was to follow up on her conversation with her daughter.    Plan: Call Patient back in 1 week.  Cassius DOROTHA Brought, PharmD, BCACP Clinical Pharmacist [phone]  "

## 2024-05-26 ENCOUNTER — Ambulatory Visit: Admitting: Cardiology

## 2024-07-31 ENCOUNTER — Ambulatory Visit: Admitting: Orthopedic Surgery
